# Patient Record
Sex: Female | Born: 1935 | Race: White | Hispanic: No | Marital: Married | Smoking: Never smoker
Health system: Southern US, Community
[De-identification: ages and names within clinical notes are randomized; demographics above are authoritative.]

## PROBLEM LIST (undated history)

## (undated) DIAGNOSIS — I5032 Chronic diastolic (congestive) heart failure: Secondary | ICD-10-CM

## (undated) DIAGNOSIS — E871 Hypo-osmolality and hyponatremia: Secondary | ICD-10-CM

## (undated) DIAGNOSIS — I340 Nonrheumatic mitral (valve) insufficiency: Secondary | ICD-10-CM

## (undated) DIAGNOSIS — D509 Iron deficiency anemia, unspecified: Secondary | ICD-10-CM

## (undated) DIAGNOSIS — I071 Rheumatic tricuspid insufficiency: Secondary | ICD-10-CM

## (undated) DIAGNOSIS — I10 Essential (primary) hypertension: Secondary | ICD-10-CM

## (undated) DIAGNOSIS — I4891 Unspecified atrial fibrillation: Secondary | ICD-10-CM

## (undated) DIAGNOSIS — G459 Transient cerebral ischemic attack, unspecified: Secondary | ICD-10-CM

## (undated) DIAGNOSIS — H409 Unspecified glaucoma: Secondary | ICD-10-CM

## (undated) HISTORY — PX: CATARACT EXTRACTION: SUR2

## (undated) HISTORY — PX: STRABISMUS SURGERY: SHX218

## (undated) HISTORY — PX: TONSILLECTOMY: SUR1361

## (undated) HISTORY — PX: DIAGNOSTIC LAPAROSCOPY: SUR761

## (undated) HISTORY — DX: Unspecified atrial fibrillation: I48.91

## (undated) HISTORY — DX: Unspecified glaucoma: H40.9

## (undated) HISTORY — DX: Chronic diastolic (congestive) heart failure: I50.32

## (undated) HISTORY — PX: ABDOMINAL HYSTERECTOMY: SHX81

## (undated) HISTORY — DX: Hypo-osmolality and hyponatremia: E87.1

## (undated) HISTORY — DX: Transient cerebral ischemic attack, unspecified: G45.9

## (undated) HISTORY — DX: Rheumatic tricuspid insufficiency: I07.1

## (undated) HISTORY — DX: Iron deficiency anemia, unspecified: D50.9

## (undated) HISTORY — DX: Nonrheumatic mitral (valve) insufficiency: I34.0

---

## 1995-07-24 DIAGNOSIS — G459 Transient cerebral ischemic attack, unspecified: Secondary | ICD-10-CM

## 1995-07-24 HISTORY — DX: Transient cerebral ischemic attack, unspecified: G45.9

## 2001-02-18 ENCOUNTER — Other Ambulatory Visit: Admission: RE | Admit: 2001-02-18 | Discharge: 2001-02-18 | Payer: Self-pay | Admitting: Obstetrics and Gynecology

## 2001-02-24 ENCOUNTER — Encounter
Admission: RE | Admit: 2001-02-24 | Discharge: 2001-02-24 | Payer: Self-pay | Admitting: Physical Medicine & Rehabilitation

## 2001-02-24 ENCOUNTER — Encounter: Payer: Self-pay | Admitting: Obstetrics and Gynecology

## 2001-08-26 ENCOUNTER — Encounter: Payer: Self-pay | Admitting: Obstetrics and Gynecology

## 2001-08-26 ENCOUNTER — Encounter: Admission: RE | Admit: 2001-08-26 | Discharge: 2001-08-26 | Payer: Self-pay | Admitting: Obstetrics and Gynecology

## 2002-12-03 ENCOUNTER — Encounter: Admission: RE | Admit: 2002-12-03 | Discharge: 2002-12-03 | Payer: Self-pay | Admitting: Obstetrics and Gynecology

## 2002-12-03 ENCOUNTER — Encounter: Payer: Self-pay | Admitting: Obstetrics and Gynecology

## 2002-12-15 ENCOUNTER — Other Ambulatory Visit: Admission: RE | Admit: 2002-12-15 | Discharge: 2002-12-15 | Payer: Self-pay | Admitting: Obstetrics and Gynecology

## 2002-12-29 ENCOUNTER — Encounter: Payer: Self-pay | Admitting: Internal Medicine

## 2002-12-29 ENCOUNTER — Encounter: Admission: RE | Admit: 2002-12-29 | Discharge: 2002-12-29 | Payer: Self-pay | Admitting: Internal Medicine

## 2003-03-15 ENCOUNTER — Encounter: Admission: RE | Admit: 2003-03-15 | Discharge: 2003-03-15 | Payer: Self-pay | Admitting: Obstetrics and Gynecology

## 2003-03-15 ENCOUNTER — Encounter: Payer: Self-pay | Admitting: Obstetrics and Gynecology

## 2003-08-04 ENCOUNTER — Ambulatory Visit (HOSPITAL_COMMUNITY): Admission: RE | Admit: 2003-08-04 | Discharge: 2003-08-04 | Payer: Self-pay | Admitting: *Deleted

## 2003-08-04 ENCOUNTER — Encounter (INDEPENDENT_AMBULATORY_CARE_PROVIDER_SITE_OTHER): Payer: Self-pay | Admitting: *Deleted

## 2003-12-17 ENCOUNTER — Encounter: Admission: RE | Admit: 2003-12-17 | Discharge: 2003-12-17 | Payer: Self-pay | Admitting: Obstetrics and Gynecology

## 2004-02-15 ENCOUNTER — Encounter: Admission: RE | Admit: 2004-02-15 | Discharge: 2004-05-15 | Payer: Self-pay | Admitting: Neurology

## 2004-03-07 ENCOUNTER — Encounter: Admission: RE | Admit: 2004-03-07 | Discharge: 2004-03-07 | Payer: Self-pay | Admitting: Ophthalmology

## 2004-03-10 ENCOUNTER — Ambulatory Visit (HOSPITAL_COMMUNITY): Admission: RE | Admit: 2004-03-10 | Discharge: 2004-03-10 | Payer: Self-pay | Admitting: Ophthalmology

## 2004-03-10 ENCOUNTER — Ambulatory Visit (HOSPITAL_BASED_OUTPATIENT_CLINIC_OR_DEPARTMENT_OTHER): Admission: RE | Admit: 2004-03-10 | Discharge: 2004-03-10 | Payer: Self-pay | Admitting: Ophthalmology

## 2005-01-02 ENCOUNTER — Other Ambulatory Visit: Admission: RE | Admit: 2005-01-02 | Discharge: 2005-01-02 | Payer: Self-pay | Admitting: Obstetrics and Gynecology

## 2005-01-09 ENCOUNTER — Encounter: Admission: RE | Admit: 2005-01-09 | Discharge: 2005-01-09 | Payer: Self-pay | Admitting: Internal Medicine

## 2005-04-05 ENCOUNTER — Ambulatory Visit (HOSPITAL_COMMUNITY): Admission: RE | Admit: 2005-04-05 | Discharge: 2005-04-05 | Payer: Self-pay | Admitting: Obstetrics and Gynecology

## 2006-02-18 ENCOUNTER — Encounter: Admission: RE | Admit: 2006-02-18 | Discharge: 2006-02-18 | Payer: Self-pay | Admitting: Obstetrics and Gynecology

## 2006-12-10 ENCOUNTER — Encounter: Admission: RE | Admit: 2006-12-10 | Discharge: 2006-12-10 | Payer: Self-pay | Admitting: Internal Medicine

## 2006-12-24 ENCOUNTER — Ambulatory Visit (HOSPITAL_COMMUNITY): Admission: RE | Admit: 2006-12-24 | Discharge: 2006-12-24 | Payer: Self-pay | Admitting: Internal Medicine

## 2006-12-24 ENCOUNTER — Encounter (INDEPENDENT_AMBULATORY_CARE_PROVIDER_SITE_OTHER): Payer: Self-pay | Admitting: Internal Medicine

## 2007-03-05 ENCOUNTER — Encounter: Admission: RE | Admit: 2007-03-05 | Discharge: 2007-03-05 | Payer: Self-pay | Admitting: Obstetrics and Gynecology

## 2007-03-18 ENCOUNTER — Other Ambulatory Visit: Admission: RE | Admit: 2007-03-18 | Discharge: 2007-03-18 | Payer: Self-pay | Admitting: Obstetrics and Gynecology

## 2008-03-08 ENCOUNTER — Encounter: Admission: RE | Admit: 2008-03-08 | Discharge: 2008-03-08 | Payer: Self-pay | Admitting: Internal Medicine

## 2008-10-19 ENCOUNTER — Encounter: Admission: RE | Admit: 2008-10-19 | Discharge: 2008-10-19 | Payer: Self-pay | Admitting: Internal Medicine

## 2009-03-09 ENCOUNTER — Encounter: Admission: RE | Admit: 2009-03-09 | Discharge: 2009-03-09 | Payer: Self-pay | Admitting: Internal Medicine

## 2009-03-16 ENCOUNTER — Encounter: Admission: RE | Admit: 2009-03-16 | Discharge: 2009-03-16 | Payer: Self-pay | Admitting: Internal Medicine

## 2009-12-05 ENCOUNTER — Encounter: Admission: RE | Admit: 2009-12-05 | Discharge: 2009-12-05 | Payer: Self-pay | Admitting: Internal Medicine

## 2010-05-09 ENCOUNTER — Encounter: Admission: RE | Admit: 2010-05-09 | Discharge: 2010-05-09 | Payer: Self-pay | Admitting: Internal Medicine

## 2010-08-14 ENCOUNTER — Encounter: Payer: Self-pay | Admitting: Internal Medicine

## 2010-12-08 NOTE — Op Note (Signed)
NAME:  [name] Jones, [name] Jones                       ACCOUNT NO.:  [id number]   MEDICAL RECORD NO.:  [id number]                   PATIENT TYPE:  OUT   LOCATION:  [facility]                                 FACILITY:  [facility]   PHYSICIAN:  Pasty Spillers. Maple Hudson, M.D.              DATE OF BIRTH:  Feb 26, 1924   DATE OF PROCEDURE:  DATE OF DISCHARGE:  03/10/2004                                 OPERATIVE REPORT   DATE OF PROCEDURE:  March 10, 2004.   PREOPERATIVE DIAGNOSES:  1.  Esotropia.  2.  Right excyclotropia.   POSTOPERATIVE DIAGNOSES:  1.  Esotropia.  2.  Right excyclotropia.   PROCEDURE:  1.  Right medial rectus muscle resection, 3.5 mm, 1.5 tendon width upshift.  2.  Right lateral rectus muscle resection, 5.0 mm, with 0.5 tendon width      downshift.   SURGEON:  Pasty Spillers. Young, MD.   ANESTHESIA:  General (laryngeal mask).   COMPLICATIONS:  None.   DESCRIPTION OF PROCEDURE:  After routine preop evaluation including informed  consent, the patient was taken to the operating room where she was  identified by me.  General anesthesia was induced without difficulty after  placement of appropriate monitors.  The patient was prepped and draped in a  standard sterile fashion.  A lid speculum was placed in the right eye.  A  traction suture of 6-0 silk was placed at the superior and inferior limbus,  and this was brought out of the eye temporally.   A limbal conjunctival peritomy at the 2 o'clock hour extent was made,  entered the right eye with Westcott scissors, relaxing incisions in the  supranasal and infranasal quadrants.  The right medial rectus muscle was  engaged on a series of muscle hooks and carefully cleared of its fascial  attachment.  The tendon was secured with double armed 6-0 Vicryl suture with  a double locking knot at each border of the muscle, 1 mm from the insertion.  The muscle was disinserted.  It was reattached to sclera at 3.5 mm posterior  to the original insertion,  with each of its poles upshifted 0.5 tendon width  from its original location.  The suture ends were tied securely after the  position of the muscle had been checked and found to be accurate.  Conjunctiva was closed with multiple interrupted 6-0 plain gut sutures,  leaving conjunctiva recessed approximately 2 mm.  The traction sutures were  used to draw in the eye nasally.  A limbal conjunctival peritomy was made  temporally with Westcott scissors as described for the medial rectus muscle.  The right lateral rectus muscle was engaged on a series of muscle hooks and  carefully cleared of its fascial attachments.  The muscle was spread between  two self-retaining hooks.  A 2-mm bite was taken off the center of the  muscle belly at a measured distance of 5.0 mm posterior to the original  insertion, the knot was tied securely at this location.  The needle at each  end of the double-armed 6-0 Vicryl suture was passed from the center of the  muscle belly to the periphery, parallel and 5.0 mm posterior to the original  insertion.  A double locking bite was placed in each border of the muscle.  A resection clamp was placed on the muscle just anterior to the sutures.  The muscle was disinserted.  The superior pole suture was passed posteriorly  and anteriorly through the inferior end of the original stump, then  anteriorly to posteriorly 3 mm below this, then posteriorly and anteriorly  through the center of the muscle belly, just posterior to the previously  placed knot.  The inferior pole suture was passed in a similar fashion,  beginning approximately 8 mm below the location of the superior pole.  All  slack in the pole sutures was removed as the muscle was pulled up to the  level of the original insertion, now down 0.5 tendon width.  The suture ends  were tied securely.  The resection clamp was removed, the portion of the  muscle anterior to the sutures was carefully excised.  Conjunctiva was   closed with multiple interrupted 6-0 plain gut sutures.  TobraDex ointment  was placed in the eye after each traction suture had been removed.  The  patient was awakened without difficulty and taken to the recovery room in  stable condition, having suffered no intraoperative or postoperative  complications.                                               Pasty Spillers. Maple Hudson, M.D.    Cheron Schaumann  D:  04/04/2004  T:  04/04/2004  Job:  [id number]

## 2011-05-14 ENCOUNTER — Other Ambulatory Visit: Payer: Self-pay | Admitting: Internal Medicine

## 2011-05-14 DIAGNOSIS — Z1231 Encounter for screening mammogram for malignant neoplasm of breast: Secondary | ICD-10-CM

## 2011-06-04 ENCOUNTER — Ambulatory Visit
Admission: RE | Admit: 2011-06-04 | Discharge: 2011-06-04 | Disposition: A | Discharge status: Home / Self Care | Payer: Medicare Other | Source: Ambulatory Visit | Attending: Internal Medicine | Admitting: Internal Medicine

## 2011-06-04 DIAGNOSIS — Z1231 Encounter for screening mammogram for malignant neoplasm of breast: Secondary | ICD-10-CM

## 2012-05-13 ENCOUNTER — Other Ambulatory Visit: Payer: Self-pay | Admitting: Internal Medicine

## 2012-05-13 DIAGNOSIS — Z1231 Encounter for screening mammogram for malignant neoplasm of breast: Secondary | ICD-10-CM

## 2012-06-16 ENCOUNTER — Ambulatory Visit
Admission: RE | Admit: 2012-06-16 | Discharge: 2012-06-16 | Disposition: A | Discharge status: Home / Self Care | Payer: Medicare Other | Source: Ambulatory Visit | Attending: Internal Medicine | Admitting: Internal Medicine

## 2012-06-16 DIAGNOSIS — Z1231 Encounter for screening mammogram for malignant neoplasm of breast: Secondary | ICD-10-CM

## 2013-07-27 ENCOUNTER — Other Ambulatory Visit: Payer: Self-pay

## 2013-07-27 DIAGNOSIS — Z1231 Encounter for screening mammogram for malignant neoplasm of breast: Secondary | ICD-10-CM

## 2013-08-17 ENCOUNTER — Ambulatory Visit
Admission: RE | Admit: 2013-08-17 | Discharge: 2013-08-17 | Disposition: A | Discharge status: Home / Self Care | Payer: Medicare Other | Source: Ambulatory Visit

## 2013-08-17 DIAGNOSIS — Z1231 Encounter for screening mammogram for malignant neoplasm of breast: Secondary | ICD-10-CM

## 2014-05-19 ENCOUNTER — Other Ambulatory Visit: Payer: Self-pay | Admitting: Internal Medicine

## 2014-05-19 DIAGNOSIS — E2839 Other primary ovarian failure: Secondary | ICD-10-CM

## 2014-05-21 ENCOUNTER — Ambulatory Visit
Admission: RE | Admit: 2014-05-21 | Discharge: 2014-05-21 | Disposition: A | Discharge status: Home / Self Care | Payer: Medicare Other | Source: Ambulatory Visit | Attending: Internal Medicine | Admitting: Internal Medicine

## 2014-05-21 ENCOUNTER — Other Ambulatory Visit: Payer: Self-pay | Admitting: Internal Medicine

## 2014-05-21 DIAGNOSIS — R05 Cough: Secondary | ICD-10-CM

## 2014-05-21 DIAGNOSIS — R059 Cough, unspecified: Secondary | ICD-10-CM

## 2014-05-31 ENCOUNTER — Encounter (INDEPENDENT_AMBULATORY_CARE_PROVIDER_SITE_OTHER): Payer: Self-pay

## 2014-05-31 ENCOUNTER — Ambulatory Visit
Admission: RE | Admit: 2014-05-31 | Discharge: 2014-05-31 | Disposition: A | Discharge status: Home / Self Care | Payer: Medicare Other | Source: Ambulatory Visit | Attending: Internal Medicine | Admitting: Internal Medicine

## 2014-05-31 DIAGNOSIS — E2839 Other primary ovarian failure: Secondary | ICD-10-CM

## 2014-08-26 ENCOUNTER — Other Ambulatory Visit: Payer: Self-pay

## 2014-08-26 DIAGNOSIS — Z1231 Encounter for screening mammogram for malignant neoplasm of breast: Secondary | ICD-10-CM

## 2014-09-01 ENCOUNTER — Other Ambulatory Visit: Payer: Self-pay

## 2014-09-01 ENCOUNTER — Ambulatory Visit
Admission: RE | Admit: 2014-09-01 | Discharge: 2014-09-01 | Disposition: A | Discharge status: Home / Self Care | Payer: Medicare Other | Source: Ambulatory Visit

## 2014-09-01 DIAGNOSIS — Z1231 Encounter for screening mammogram for malignant neoplasm of breast: Secondary | ICD-10-CM

## 2014-12-01 ENCOUNTER — Other Ambulatory Visit (HOSPITAL_COMMUNITY): Payer: Self-pay | Admitting: Respiratory Therapy

## 2014-12-01 DIAGNOSIS — R0602 Shortness of breath: Secondary | ICD-10-CM

## 2014-12-07 ENCOUNTER — Ambulatory Visit (HOSPITAL_COMMUNITY)
Admission: RE | Admit: 2014-12-07 | Discharge: 2014-12-07 | Disposition: A | Discharge status: Home / Self Care | Payer: Medicare Other | Source: Ambulatory Visit | Attending: Internal Medicine | Admitting: Internal Medicine

## 2014-12-07 DIAGNOSIS — R0602 Shortness of breath: Secondary | ICD-10-CM | POA: Insufficient documentation

## 2014-12-07 MED ORDER — ALBUTEROL SULFATE (2.5 MG/3ML) 0.083% IN NEBU
2.5000 mg | INHALATION_SOLUTION | Freq: Once | RESPIRATORY_TRACT | Status: AC
  Administered 2014-12-07: 2.5 mg via RESPIRATORY_TRACT

## 2014-12-09 LAB — PULMONARY FUNCTION TEST
DL/VA % pred: 93 %
DL/VA: 3.98 ml/min/mmHg/L
DLCO unc % pred: 81 %
DLCO unc: 15.37 ml/min/mmHg
FEF 25-75 Post: 1.66 L/sec
FEF 25-75 Pre: 1.38 L/sec
FEF2575-%Change-Post: 20 %
FEF2575-%Pred-Post: 237 %
FEF2575-%Pred-Pre: 196 %
FEV1-%Change-Post: 5 %
FEV1-%Pred-Post: 137 %
FEV1-%Pred-Pre: 129 %
FEV1-Post: 1.7 L
FEV1-Pre: 1.61 L
FEV1FVC-%Change-Post: -1 %
FEV1FVC-%Pred-Pre: 107 %
FEV6-%Change-Post: 8 %
FEV6-%Pred-Post: 141 %
FEV6-%Pred-Pre: 131 %
FEV6-Post: 2.24 L
FEV6-Pre: 2.07 L
FEV6FVC-%Change-Post: 0 %
FEV6FVC-%Pred-Post: 108 %
FEV6FVC-%Pred-Pre: 107 %
FVC-%Change-Post: 7 %
FVC-%Pred-Post: 130 %
FVC-%Pred-Pre: 121 %
FVC-Post: 2.24 L
FVC-Pre: 2.09 L
Post FEV1/FVC ratio: 76 %
Post FEV6/FVC ratio: 100 %
Pre FEV1/FVC ratio: 77 %
Pre FEV6/FVC Ratio: 99 %
RV % pred: 107 %
RV: 2.54 L
TLC % pred: 105 %
TLC: 4.72 L

## 2014-12-15 ENCOUNTER — Other Ambulatory Visit (HOSPITAL_COMMUNITY): Payer: Self-pay | Admitting: Internal Medicine

## 2014-12-15 DIAGNOSIS — R0600 Dyspnea, unspecified: Secondary | ICD-10-CM

## 2014-12-17 ENCOUNTER — Ambulatory Visit (HOSPITAL_COMMUNITY)
Admission: RE | Admit: 2014-12-17 | Discharge: 2014-12-17 | Disposition: A | Discharge status: Home / Self Care | Payer: Medicare Other | Source: Ambulatory Visit | Attending: Internal Medicine | Admitting: Internal Medicine

## 2014-12-17 DIAGNOSIS — R0609 Other forms of dyspnea: Secondary | ICD-10-CM | POA: Insufficient documentation

## 2014-12-17 DIAGNOSIS — I10 Essential (primary) hypertension: Secondary | ICD-10-CM | POA: Diagnosis not present

## 2014-12-17 DIAGNOSIS — R0600 Dyspnea, unspecified: Secondary | ICD-10-CM

## 2014-12-17 DIAGNOSIS — E785 Hyperlipidemia, unspecified: Secondary | ICD-10-CM | POA: Insufficient documentation

## 2014-12-17 NOTE — Progress Notes (Signed)
  Echocardiogram 2D Echocardiogram has been performed.  [name] Jones, [name] Jones R 12/17/2014, 10:57 AM

## 2015-06-01 ENCOUNTER — Emergency Department (HOSPITAL_COMMUNITY): Payer: Medicare Other

## 2015-06-01 ENCOUNTER — Encounter (HOSPITAL_COMMUNITY): Payer: Self-pay | Admitting: Anesthesiology

## 2015-06-01 ENCOUNTER — Inpatient Hospital Stay (HOSPITAL_COMMUNITY)
Admission: EM | Admit: 2015-06-01 | Discharge: 2015-06-10 | DRG: 291 | Disposition: A | Discharge status: Home Health | Payer: Medicare Other | Attending: Cardiovascular Disease | Admitting: Cardiovascular Disease

## 2015-06-01 ENCOUNTER — Inpatient Hospital Stay (HOSPITAL_COMMUNITY): Payer: Medicare Other

## 2015-06-01 DIAGNOSIS — I5021 Acute systolic (congestive) heart failure: Secondary | ICD-10-CM | POA: Diagnosis present

## 2015-06-01 DIAGNOSIS — H409 Unspecified glaucoma: Secondary | ICD-10-CM | POA: Diagnosis present

## 2015-06-01 DIAGNOSIS — J810 Acute pulmonary edema: Secondary | ICD-10-CM

## 2015-06-01 DIAGNOSIS — E876 Hypokalemia: Secondary | ICD-10-CM | POA: Diagnosis not present

## 2015-06-01 DIAGNOSIS — J209 Acute bronchitis, unspecified: Secondary | ICD-10-CM | POA: Diagnosis present

## 2015-06-01 DIAGNOSIS — R000 Tachycardia, unspecified: Secondary | ICD-10-CM | POA: Diagnosis not present

## 2015-06-01 DIAGNOSIS — Z7901 Long term (current) use of anticoagulants: Secondary | ICD-10-CM | POA: Diagnosis not present

## 2015-06-01 DIAGNOSIS — R079 Chest pain, unspecified: Secondary | ICD-10-CM

## 2015-06-01 DIAGNOSIS — R0789 Other chest pain: Secondary | ICD-10-CM

## 2015-06-01 DIAGNOSIS — J189 Pneumonia, unspecified organism: Secondary | ICD-10-CM | POA: Diagnosis present

## 2015-06-01 DIAGNOSIS — E871 Hypo-osmolality and hyponatremia: Secondary | ICD-10-CM | POA: Diagnosis present

## 2015-06-01 DIAGNOSIS — I110 Hypertensive heart disease with heart failure: Secondary | ICD-10-CM | POA: Diagnosis present

## 2015-06-01 DIAGNOSIS — I482 Chronic atrial fibrillation: Secondary | ICD-10-CM | POA: Diagnosis present

## 2015-06-01 DIAGNOSIS — I4891 Unspecified atrial fibrillation: Secondary | ICD-10-CM

## 2015-06-01 DIAGNOSIS — K5900 Constipation, unspecified: Secondary | ICD-10-CM | POA: Diagnosis not present

## 2015-06-01 DIAGNOSIS — I081 Rheumatic disorders of both mitral and tricuspid valves: Secondary | ICD-10-CM | POA: Diagnosis present

## 2015-06-01 DIAGNOSIS — D509 Iron deficiency anemia, unspecified: Secondary | ICD-10-CM | POA: Diagnosis present

## 2015-06-01 DIAGNOSIS — R0602 Shortness of breath: Secondary | ICD-10-CM | POA: Diagnosis present

## 2015-06-01 DIAGNOSIS — I5023 Acute on chronic systolic (congestive) heart failure: Secondary | ICD-10-CM | POA: Diagnosis present

## 2015-06-01 DIAGNOSIS — I509 Heart failure, unspecified: Secondary | ICD-10-CM

## 2015-06-01 DIAGNOSIS — R0600 Dyspnea, unspecified: Secondary | ICD-10-CM

## 2015-06-01 HISTORY — DX: Essential (primary) hypertension: I10

## 2015-06-01 LAB — BASIC METABOLIC PANEL
Anion gap: 11 (ref 5–15)
BUN: 14 mg/dL (ref 6–20)
CO2: 23 mmol/L (ref 22–32)
Calcium: 9 mg/dL (ref 8.9–10.3)
Chloride: 99 mmol/L — ABNORMAL LOW (ref 101–111)
Creatinine, Ser: 0.63 mg/dL (ref 0.44–1.00)
GFR calc Af Amer: >60 mL/min (ref >60)
GFR calc non Af Amer: >60 mL/min (ref >60)
Glucose, Bld: 138 mg/dL — ABNORMAL HIGH (ref 65–99)
Potassium: 4.4 mmol/L (ref 3.5–5.1)
Sodium: 133 mmol/L — ABNORMAL LOW (ref 135–145)

## 2015-06-01 LAB — CBC WITH DIFFERENTIAL/PLATELET
Basophils Absolute: 0 10*3/uL (ref 0.0–0.1)
Basophils Relative: 0 %
Eosinophils Absolute: 0.1 10*3/uL (ref 0.0–0.7)
Eosinophils Relative: 1 %
HCT: 31.1 % — ABNORMAL LOW (ref 36.0–46.0)
Hemoglobin: 10.5 g/dL — ABNORMAL LOW (ref 12.0–15.0)
Lymphocytes Relative: 7 %
Lymphs Abs: 0.7 10*3/uL (ref 0.7–4.0)
MCH: 29.7 pg (ref 26.0–34.0)
MCHC: 33.8 g/dL (ref 30.0–36.0)
MCV: 87.9 fL (ref 78.0–100.0)
Monocytes Absolute: 0.4 10*3/uL (ref 0.1–1.0)
Monocytes Relative: 4 %
Neutro Abs: 8.6 10*3/uL — ABNORMAL HIGH (ref 1.7–7.7)
Neutrophils Relative %: 88 %
Platelets: 279 10*3/uL (ref 150–400)
RBC: 3.54 MIL/uL — ABNORMAL LOW (ref 3.87–5.11)
RDW: 13.5 % (ref 11.5–15.5)
WBC: 9.8 10*3/uL (ref 4.0–10.5)

## 2015-06-01 LAB — BRAIN NATRIURETIC PEPTIDE: B Natriuretic Peptide: 286.8 pg/mL — ABNORMAL HIGH (ref 0.0–100.0)

## 2015-06-01 LAB — MRSA PCR SCREENING: MRSA by PCR: NEGATIVE

## 2015-06-01 LAB — I-STAT TROPONIN, ED: Troponin i, poc: 0 ng/mL (ref 0.00–0.08)

## 2015-06-01 LAB — TROPONIN I
Troponin I: <0.03 ng/mL (ref <0.031)
Troponin I: <0.03 ng/mL (ref <0.031)

## 2015-06-01 MED ORDER — DILTIAZEM HCL 25 MG/5ML IV SOLN
5.0000 mg | Freq: Once | INTRAVENOUS | Status: AC
  Administered 2015-06-01: 5 mg via INTRAVENOUS
  Filled 2015-06-01: qty 5

## 2015-06-01 MED ORDER — TIMOLOL MALEATE 0.5 % OP SOLN
1.0000 [drp] | Freq: Every day | OPHTHALMIC | Status: DC
  Administered 2015-06-01 – 2015-06-10 (×10): 1 [drp] via OPHTHALMIC
  Filled 2015-06-01 (×2): qty 5

## 2015-06-01 MED ORDER — ONDANSETRON HCL 4 MG/2ML IJ SOLN
4.0000 mg | Freq: Four times a day (QID) | INTRAMUSCULAR | Status: DC | PRN
  Administered 2015-06-02 – 2015-06-04 (×2): 4 mg via INTRAVENOUS
  Filled 2015-06-01 (×2): qty 2

## 2015-06-01 MED ORDER — METOPROLOL SUCCINATE ER 50 MG PO TB24
50.0000 mg | ORAL_TABLET | Freq: Every day | ORAL | Status: DC
  Administered 2015-06-01 – 2015-06-10 (×10): 50 mg via ORAL
  Filled 2015-06-01 (×10): qty 1

## 2015-06-01 MED ORDER — SODIUM CHLORIDE 0.9 % IV SOLN: 250.0000 mL | INTRAVENOUS | Status: DC | PRN

## 2015-06-01 MED ORDER — CETYLPYRIDINIUM CHLORIDE 0.05 % MT LIQD
7.0000 mL | Freq: Two times a day (BID) | OROMUCOSAL | Status: DC
  Administered 2015-06-01 – 2015-06-09 (×16): 7 mL via OROMUCOSAL

## 2015-06-01 MED ORDER — LATANOPROST 0.005 % OP SOLN
1.0000 [drp] | Freq: Every day | OPHTHALMIC | Status: DC
  Administered 2015-06-01 – 2015-06-09 (×9): 1 [drp] via OPHTHALMIC
  Filled 2015-06-01 (×3): qty 2.5

## 2015-06-01 MED ORDER — FUROSEMIDE 10 MG/ML IJ SOLN
40.0000 mg | Freq: Once | INTRAMUSCULAR | Status: AC
  Administered 2015-06-01: 40 mg via INTRAVENOUS
  Filled 2015-06-01: qty 4

## 2015-06-01 MED ORDER — OFF THE BEAT BOOK
Freq: Once | Status: DC
  Filled 2015-06-01: qty 1

## 2015-06-01 MED ORDER — VITAMIN C 500 MG PO TABS
500.0000 mg | ORAL_TABLET | Freq: Every day | ORAL | Status: DC
  Administered 2015-06-02 – 2015-06-10 (×9): 500 mg via ORAL
  Filled 2015-06-01 (×10): qty 1

## 2015-06-01 MED ORDER — AMLODIPINE BESYLATE 5 MG PO TABS
5.0000 mg | ORAL_TABLET | Freq: Every day | ORAL | Status: DC
  Administered 2015-06-01 – 2015-06-02 (×2): 5 mg via ORAL
  Filled 2015-06-01 (×2): qty 1

## 2015-06-01 MED ORDER — ACETAMINOPHEN 325 MG PO TABS
650.0000 mg | ORAL_TABLET | ORAL | Status: DC | PRN
  Administered 2015-06-01 – 2015-06-10 (×4): 650 mg via ORAL
  Filled 2015-06-01 (×4): qty 2

## 2015-06-01 MED ORDER — DEXTROSE 5 % IV SOLN
5.0000 mg/h | Freq: Once | INTRAVENOUS | Status: AC
  Administered 2015-06-01: 5 mg/h via INTRAVENOUS
  Filled 2015-06-01: qty 100

## 2015-06-01 MED ORDER — DILTIAZEM HCL 100 MG IV SOLR
5.0000 mg/h | INTRAVENOUS | Status: DC
  Administered 2015-06-02 (×2): 15 mg/h via INTRAVENOUS
  Filled 2015-06-01 (×3): qty 100

## 2015-06-01 MED ORDER — SODIUM CHLORIDE 0.9 % IJ SOLN
3.0000 mL | INTRAMUSCULAR | Status: DC | PRN
  Administered 2015-06-05: 3 mL via INTRAVENOUS
  Filled 2015-06-01: qty 3

## 2015-06-01 MED ORDER — ADULT MULTIVITAMIN W/MINERALS CH
1.0000 | ORAL_TABLET | Freq: Every day | ORAL | Status: DC
  Administered 2015-06-02 – 2015-06-10 (×9): 1 via ORAL
  Filled 2015-06-01 (×10): qty 1

## 2015-06-01 MED ORDER — SODIUM CHLORIDE 0.9 % IJ SOLN
3.0000 mL | Freq: Two times a day (BID) | INTRAMUSCULAR | Status: DC
  Administered 2015-06-01 – 2015-06-10 (×14): 3 mL via INTRAVENOUS

## 2015-06-01 MED ORDER — RIVAROXABAN 15 MG PO TABS
15.0000 mg | ORAL_TABLET | Freq: Every day | ORAL | Status: DC
  Administered 2015-06-02 – 2015-06-10 (×9): 15 mg via ORAL
  Filled 2015-06-01 (×10): qty 1

## 2015-06-01 MED ORDER — RIVAROXABAN 20 MG PO TABS
20.0000 mg | ORAL_TABLET | Freq: Every day | ORAL | Status: DC
  Administered 2015-06-01: 20 mg via ORAL
  Filled 2015-06-01: qty 1

## 2015-06-01 MED ORDER — FUROSEMIDE 10 MG/ML IJ SOLN
40.0000 mg | Freq: Two times a day (BID) | INTRAMUSCULAR | Status: DC
  Administered 2015-06-02: 40 mg via INTRAVENOUS
  Filled 2015-06-01: qty 4

## 2015-06-01 NOTE — ED Notes (Signed)
Ultrasound at bedside

## 2015-06-01 NOTE — ED Provider Notes (Signed)
CSN: [id number]     Arrival date & time 06/01/15  1133 History   First MD Initiated Contact with Patient 06/01/15 1151     Chief Complaint  Patient presents with  . Shortness of Breath     (Consider location/radiation/quality/duration/timing/severity/associated sxs/prior Treatment) Patient is a 81 y.o. female presenting with shortness of breath. The history is provided by the patient and medical records.  Shortness of Breath   This is an 81 year old female with recently diagnosed A. fib by her PCP on Monday, presenting to the ED for shortness of breath.  Patient states over the weekend she felt very "winded" with any type of exertional activity. She states she was seen by her family care doctor on Monday and told she had A. fib. She was started on xarelto and metoprolol and was supposed to follow-up in 2 weeks.  Patient states she has been taking the medications as directed for the past 2 days but states symptoms are worsening. She states this morning she simply could not catch her breath, even while sitting.  She states when she gets up she feels increased shortness of breath, lightheaded, and somewhat dizzy. She states she does have palpitations but denies any chest pain. Patient has no known cardiac history aside from A. fib. She denies any recent cough, fever, or chills. Patient remains in A. fib on arrival, rate 110's -120's.  No past medical history on file. No past surgical history on file. No family history on file. Social History  Substance Use Topics  . Smoking status: Not on file  . Smokeless tobacco: Not on file  . Alcohol Use: Not on file   OB History    No data available     Review of Systems  Respiratory: Positive for shortness of breath.   Cardiovascular: Positive for palpitations.  All other systems reviewed and are negative.     Allergies  Review of patient's allergies indicates not on file.  Home Medications   Prior to Admission medications   Not on File    BP 169/104 mmHg  Pulse 118  Temp(Src) 98.1 F (36.7 C) (Oral)  Resp 22  Ht 5' (1.524 m)  Wt 125 lb (56.7 kg)  BMI 24.41 kg/m2  SpO2 100%   Physical Exam  Constitutional: She is oriented to person, place, and time. She appears well-developed and well-nourished.  HENT:  Head: Normocephalic and atraumatic.  Mouth/Throat: Oropharynx is clear and moist.  Eyes: Conjunctivae and EOM are normal. Pupils are equal, round, and reactive to light.  Neck: Normal range of motion.  Cardiovascular: Normal heart sounds.  An irregularly irregular rhythm present. Tachycardia present.   AFIB w/ RVR  Pulmonary/Chest: Effort normal and breath sounds normal. No respiratory distress. She has no wheezes.  Speaking in short truncated sentences, no distress  Abdominal: Soft. Bowel sounds are normal.  Musculoskeletal: Normal range of motion.  Neurological: She is alert and oriented to person, place, and time.  Skin: Skin is warm and dry.  Psychiatric: She has a normal mood and affect.  Nursing note and vitals reviewed.   ED Course  Procedures (including critical care time)  CRITICAL CARE Performed by: Garlon Hatchet   Total critical care time: 45 minutes  Critical care time was exclusive of separately billable procedures and treating other patients.  Critical care was necessary to treat or prevent imminent or life-threatening deterioration.  Critical care was time spent personally by me on the following activities: development of treatment plan with patient and/or  surrogate as well as nursing, discussions with consultants, evaluation of patient's response to treatment, examination of patient, obtaining history from patient or surrogate, ordering and performing treatments and interventions, ordering and review of laboratory studies, ordering and review of radiographic studies, pulse oximetry and re-evaluation of patient's condition.  Medications  furosemide (LASIX) injection 40 mg (not  administered)  diltiazem (CARDIZEM) injection 5 mg (5 mg Intravenous Given 06/01/15 1236)    Followed by  diltiazem (CARDIZEM) 100 mg in dextrose 5 % 100 mL (1 mg/mL) infusion (10 mg/hr Intravenous Rate/Dose Change 06/01/15 1344)   Labs Review Labs Reviewed  CBC WITH DIFFERENTIAL/PLATELET - Abnormal; Notable for the following:    RBC 3.54 (*)    Hemoglobin 10.5 (*)    HCT 31.1 (*)    Neutro Abs 8.6 (*)    All other components within normal limits  BASIC METABOLIC PANEL - Abnormal; Notable for the following:    Sodium 133 (*)    Chloride 99 (*)    Glucose, Bld 138 (*)    All other components within normal limits  BRAIN NATRIURETIC PEPTIDE - Abnormal; Notable for the following:    B Natriuretic Peptide 286.8 (*)    All other components within normal limits  I-STAT TROPOININ, ED    Imaging Review Dg Chest 2 View  06/01/2015  CLINICAL DATA:  Patient C/O dyspnea that worsened this AM. She was going to PCP but called EMS instead due to dyspnea. Patient recently diagnosed with A-fib by her PCP. EXAM: CHEST  2 VIEW COMPARISON:  05/21/2014 FINDINGS: Midline trachea. Mild cardiomegaly with aortic atherosclerosis. Small, left greater than right pleural effusions. No pneumothorax. Mild to moderate interstitial prominence and indistinctness. This is asymmetric, greater on the right. Concurrent right perihilar and bibasilar airspace opacities. IMPRESSION: Cardiomegaly with moderate interstitial prominence, suspicious for pulmonary edema. Concurrent bilateral pleural effusions. Bibasilar airspace disease, greater on the left. This could represent atelectasis or concurrent infection. Aortic atherosclerosis. Electronically Signed   By: Jeronimo GreavesKyle  Talbot M.D.   On: 06/01/2015 13:01   I have personally reviewed and evaluated these images and lab results as part of my medical decision-making.   EKG Interpretation   Date/Time:  Wednesday June 01 2015 11:46:05 EST Ventricular Rate:  115 PR Interval:     QRS Duration: 87 QT Interval:  358 QTC Calculation: 495 R Axis:   75 Text Interpretation:  Atrial fibrillation Low voltage, precordial leads  Borderline prolonged QT interval Baseline wander in lead(s) I II aVR  Atrial fibrillation new from baseline no acute ischemia Confirmed by  Corlis LeakMACKUEN, COURTNEY ([id number]) on 06/01/2015 11:48:59 AM      MDM   Final diagnoses:  Atrial fibrillation with RVR (HCC)  Acute pulmonary edema (HCC)  Dyspnea   81 year old female here with dyspnea. Recently diagnosed with A. fib, was started on metoprolol and xarelto.  States symptoms worsened despite 2 days of the medications.  Patient is in A. fib with RVR on arrival, rate 120's -130's.  She has some increased work of breathing, improved with 2 L supplemental oxygen. She is able to speak in short, truncated sentences.  Labwork is overall reassuring, troponin negative. BNP is elevated at 286.8 which is new.  Chest x-ray with pulmonary edema which is also new.  Patient was started on cardizem drip, some improvement of rate to low 100's.  Low dose lasix also given for new pulmonary edema.  Case discussed with on call cardiology, Dr. Algie CofferKadakia, who has evaluated in the ED and will admit  for further management.  Garlon Hatchet, PA-C 06/01/15 1435  Courteney Randall An, MD 06/01/15 1645

## 2015-06-01 NOTE — Progress Notes (Signed)
  Echocardiogram 2D Echocardiogram has been performed.  Delcie RochENNINGTON, Alonna Bartling 06/01/2015, 3:59 PM

## 2015-06-01 NOTE — ED Notes (Signed)
Pt placed in gown and in bed. Pt monitored by pulse ox, bp cuff, and 12-lead. 

## 2015-06-01 NOTE — H&P (Signed)
Referring Physician:  TAILYNN ARMETTA is an 81 y.o. female.                       Chief Complaint: Palpitation and shortness of breath.  HPI: 81 year old female with recently diagnosed A. fib by her PCP on Monday, presenting to the ED for shortness of breath. Patient states over the weekend she felt very "winded" with any type of exertional activity. She was started on xarelto and metoprolol and was supposed to have follow-up in 2 weeks. Patient states she has been taking the medications as directed for the past 2 days but states symptoms are worsening. She states this morning she simply could not catch her breath, even while sitting. She states when she gets up she feels increased shortness of breath, lightheaded, and somewhat dizzy. She states she does have palpitations but denies any chest pain. Patient has no known cardiac history aside from A. fib. She denies any recent cough, fever, or chills. Patient remains in A. fib on arrival, rate 110's -120's. She feels better post IV lasix.  No past medical history on file.    No past surgical history on file.  No family history on file. Social History:  has no tobacco, alcohol, and drug history on file.  Allergies: No Known Allergies   (Not in a hospital admission)  Results for orders placed or performed during the hospital encounter of 06/01/15 (from the past 48 hour(s))  CBC with Differential     Status: Abnormal   Collection Time: 06/01/15 12:50 PM  Result Value Ref Range   WBC 9.8 4.0 - 10.5 K/uL   RBC 3.54 (L) 3.87 - 5.11 MIL/uL   Hemoglobin 10.5 (L) 12.0 - 15.0 g/dL   HCT 31.1 (L) 36.0 - 46.0 %   MCV 87.9 78.0 - 100.0 fL   MCH 29.7 26.0 - 34.0 pg   MCHC 33.8 30.0 - 36.0 g/dL   RDW 13.5 11.5 - 15.5 %   Platelets 279 150 - 400 K/uL   Neutrophils Relative % 88 %   Neutro Abs 8.6 (H) 1.7 - 7.7 K/uL   Lymphocytes Relative 7 %   Lymphs Abs 0.7 0.7 - 4.0 K/uL   Monocytes Relative 4 %   Monocytes Absolute 0.4 0.1 - 1.0 K/uL    Eosinophils Relative 1 %   Eosinophils Absolute 0.1 0.0 - 0.7 K/uL   Basophils Relative 0 %   Basophils Absolute 0.0 0.0 - 0.1 K/uL  Basic metabolic panel     Status: Abnormal   Collection Time: 06/01/15 12:50 PM  Result Value Ref Range   Sodium 133 (L) 135 - 145 mmol/L   Potassium 4.4 3.5 - 5.1 mmol/L   Chloride 99 (L) 101 - 111 mmol/L   CO2 23 22 - 32 mmol/L   Glucose, Bld 138 (H) 65 - 99 mg/dL   BUN 14 6 - 20 mg/dL   Creatinine, Ser 0.63 0.44 - 1.00 mg/dL   Calcium 9.0 8.9 - 10.3 mg/dL   GFR calc non Af Amer >60 >60 mL/min   GFR calc Af Amer >60 >60 mL/min    Comment: (NOTE) The eGFR has been calculated using the CKD EPI equation. This calculation has not been validated in all clinical situations. eGFR's persistently <60 mL/min signify possible Chronic Kidney Disease.    Anion gap 11 5 - 15  Brain natriuretic peptide     Status: Abnormal   Collection Time: 06/01/15 12:50 PM  Result Value  Ref Range   B Natriuretic Peptide 286.8 (H) 0.0 - 100.0 pg/mL  I-stat troponin, ED     Status: None   Collection Time: 06/01/15 12:58 PM  Result Value Ref Range   Troponin i, poc 0.00 0.00 - 0.08 ng/mL   Comment 3            Comment: Due to the release kinetics of cTnI, a negative result within the first hours of the onset of symptoms does not rule out myocardial infarction with certainty. If myocardial infarction is still suspected, repeat the test at appropriate intervals.    Dg Chest 2 View  06/01/2015  CLINICAL DATA:  Patient C/O dyspnea that worsened this AM. She was going to PCP but called EMS instead due to dyspnea. Patient recently diagnosed with A-fib by her PCP. EXAM: CHEST  2 VIEW COMPARISON:  05/21/2014 FINDINGS: Midline trachea. Mild cardiomegaly with aortic atherosclerosis. Small, left greater than right pleural effusions. No pneumothorax. Mild to moderate interstitial prominence and indistinctness. This is asymmetric, greater on the right. Concurrent right perihilar and  bibasilar airspace opacities. IMPRESSION: Cardiomegaly with moderate interstitial prominence, suspicious for pulmonary edema. Concurrent bilateral pleural effusions. Bibasilar airspace disease, greater on the left. This could represent atelectasis or concurrent infection. Aortic atherosclerosis. Electronically Signed   By: Abigail Miyamoto M.D.   On: 06/01/2015 13:01    Review Of Systems Respiratory: Positive for shortness of breath.  Cardiovascular: Positive for palpitations.  All other systems reviewed and are negative.  Blood pressure 182/125, pulse 117, temperature 98.1 F (36.7 C), temperature source Oral, resp. rate 32, height 5' (1.524 m), weight 56.7 kg (125 lb), SpO2 95 %. Physical Exam  Constitutional: She appears short and well-nourished.  HENT: Head: Normocephalic and atraumatic. Wears glasses. Conj-pink, Sclera-white.Pupils are equal, round, and reactive to light. Mouth/Throat: Oropharynx is clear and moist.  Cardiovascular: Normal heart sounds. An irregularly irregular rhythm present. Tachycardia present.  Pulmonary/Chest: Effort normal and breath sounds normal. moderate respiratory distress. She has no wheezes. Speaking in short truncated sentences.  Abdominal: Soft. Bowel sounds are normal.  Musculoskeletal: Normal range of motion.  Neurological: She is alert and oriented to person, place, and time. Moves all 4 extremities. Skin: Skin is warm and dry.  Psychiatric: She has a normal mood and affect.  Nursing note and vitals reviewed.  Assessment/Plan Acute left heart systolic failure with pulmonary edema Atrial fibrillation with RVR, CHA2DSVASc score of 4/9  Admit to stepdown IV Lasix IV diltiazem Full code per patient.  Birdie Riddle, MD  06/01/2015, 2:36 PM

## 2015-06-01 NOTE — ED Notes (Signed)
Patient C/O dyspnea that worsened this AM.  She was going to PCP but called EMS instead due to dyspnea.  Patient recently diagnosed with A-fib by her PCP. BP 176/110 per EMS

## 2015-06-02 LAB — BASIC METABOLIC PANEL
Anion gap: 9 (ref 5–15)
BUN: 13 mg/dL (ref 6–20)
CO2: 23 mmol/L (ref 22–32)
Calcium: 8.3 mg/dL — ABNORMAL LOW (ref 8.9–10.3)
Chloride: 97 mmol/L — ABNORMAL LOW (ref 101–111)
Creatinine, Ser: 0.71 mg/dL (ref 0.44–1.00)
GFR calc Af Amer: >60 mL/min (ref >60)
GFR calc non Af Amer: >60 mL/min (ref >60)
Glucose, Bld: 124 mg/dL — ABNORMAL HIGH (ref 65–99)
Potassium: 3.4 mmol/L — ABNORMAL LOW (ref 3.5–5.1)
Sodium: 129 mmol/L — ABNORMAL LOW (ref 135–145)

## 2015-06-02 LAB — TROPONIN I: Troponin I: <0.03 ng/mL (ref <0.031)

## 2015-06-02 MED ORDER — DILTIAZEM HCL 60 MG PO TABS
60.0000 mg | ORAL_TABLET | Freq: Four times a day (QID) | ORAL | Status: DC
  Administered 2015-06-02 – 2015-06-06 (×17): 60 mg via ORAL
  Filled 2015-06-02 (×17): qty 1

## 2015-06-02 MED ORDER — POTASSIUM CHLORIDE ER 10 MEQ PO TBCR
20.0000 meq | EXTENDED_RELEASE_TABLET | Freq: Three times a day (TID) | ORAL | Status: AC
  Administered 2015-06-02 – 2015-06-03 (×6): 20 meq via ORAL
  Filled 2015-06-02 (×11): qty 2

## 2015-06-02 MED ORDER — GUAIFENESIN-CODEINE 100-10 MG/5ML PO SOLN
10.0000 mL | ORAL | Status: DC
  Administered 2015-06-02: 2.5 mL via ORAL
  Filled 2015-06-02: qty 10

## 2015-06-02 MED ORDER — DEXTROSE 5 % IV SOLN
1.0000 g | INTRAVENOUS | Status: DC
  Administered 2015-06-02 – 2015-06-07 (×6): 1 g via INTRAVENOUS
  Filled 2015-06-02 (×6): qty 10

## 2015-06-02 MED ORDER — GUAIFENESIN-CODEINE 100-10 MG/5ML PO SOLN: 5.0000 mL | ORAL | Status: DC | PRN

## 2015-06-02 MED ORDER — FUROSEMIDE 10 MG/ML IJ SOLN
40.0000 mg | Freq: Two times a day (BID) | INTRAMUSCULAR | Status: DC
  Administered 2015-06-02 – 2015-06-06 (×10): 40 mg via INTRAVENOUS
  Filled 2015-06-02 (×10): qty 4

## 2015-06-02 MED ORDER — GUAIFENESIN-DM 100-10 MG/5ML PO SYRP
5.0000 mL | ORAL_SOLUTION | ORAL | Status: DC | PRN
  Administered 2015-06-02 – 2015-06-03 (×3): 5 mL via ORAL
  Filled 2015-06-02 (×3): qty 5

## 2015-06-02 NOTE — Progress Notes (Signed)
Ref: [name] PeckOBERTS, [name] WAYNE, MD   Subjective:  Feeling better. Fair urine output. Normal systolic function with severe MR and TR and dilated LA and RA on echocardiogram.  Objective:  Vital Signs in the last 24 hours: Temp:  [97.2 F (36.2 C)-98.6 F (37 C)] 97.3 F (36.3 C) (11/10 1940) Cardiac Rhythm:  [-] Atrial fibrillation (11/10 2132) Resp:  [18-37] 37 (11/10 2200) BP: (96-138)/(59-92) 137/85 mmHg (11/10 2200) SpO2:  [89 %-98 %] 96 % (11/10 2200) Weight:  [57.8 kg (127 lb 6.8 oz)] 57.8 kg (127 lb 6.8 oz) (11/10 0426)  Physical Exam: BP Readings from Last 1 Encounters:  06/02/15 137/85     Wt Readings from Last 1 Encounters:  06/02/15 57.8 kg (127 lb 6.8 oz)    Weight change:   HEENT: [place]/AT, Eyes- PERL, EOMI, Conjunctiva-Pink, Sclera-Non-icteric Neck: No JVD, No bruit, Trachea midline. Lungs:  Clear, Bilateral. Cardiac:  Regular rhythm, normal S1 and S2, no S3. III/VI systolic murmur. Abdomen:  Soft, non-tender. Extremities:  No edema present. No cyanosis. No clubbing. CNS: AxOx3, Cranial nerves grossly intact, moves all 4 extremities. Right handed. Skin: Warm and dry.   Intake/Output from previous day: 11/09 0701 - 11/10 0700 In: 364.8 [P.O.:240; I.V.:124.8] Out: 2650 [Urine:2650]    Lab Results: BMET    Component Value Date/Time   NA 129* 06/02/2015 0229   NA 133* 06/01/2015 1250   K 3.4* 06/02/2015 0229   K 4.4 06/01/2015 1250   CL 97* 06/02/2015 0229   CL 99* 06/01/2015 1250   CO2 23 06/02/2015 0229   CO2 23 06/01/2015 1250   GLUCOSE 124* 06/02/2015 0229   GLUCOSE 138* 06/01/2015 1250   BUN 13 06/02/2015 0229   BUN 14 06/01/2015 1250   CREATININE 0.71 06/02/2015 0229   CREATININE 0.63 06/01/2015 1250   CALCIUM 8.3* 06/02/2015 0229   CALCIUM 9.0 06/01/2015 1250   GFRNONAA >60 06/02/2015 0229   GFRNONAA >60 06/01/2015 1250   GFRAA >60 06/02/2015 0229   GFRAA >60 06/01/2015 1250   CBC    Component Value Date/Time   WBC 9.8 06/01/2015 1250   RBC 3.54* 06/01/2015 1250   HGB 10.5* 06/01/2015 1250   HCT 31.1* 06/01/2015 1250   PLT 279 06/01/2015 1250   MCV 87.9 06/01/2015 1250   MCH 29.7 06/01/2015 1250   MCHC 33.8 06/01/2015 1250   RDW 13.5 06/01/2015 1250   LYMPHSABS 0.7 06/01/2015 1250   MONOABS 0.4 06/01/2015 1250   EOSABS 0.1 06/01/2015 1250   BASOSABS 0.0 06/01/2015 1250   HEPATIC Function Panel No results for input(s): PROT in the last 8760 hours.  Invalid input(s):  ALBUMIN,  AST,  ALT,  ALKPHOS,  BILIDIR,  IBILI HEMOGLOBIN A1C No components found for: HGA1C,  MPG CARDIAC ENZYMES Lab Results  Component Value Date   TROPONINI <0.03 06/02/2015   TROPONINI <0.03 06/01/2015   TROPONINI <0.03 06/01/2015   BNP No results for input(s): PROBNP in the last 8760 hours. TSH No results for input(s): TSH in the last 8760 hours. CHOLESTEROL No results for input(s): CHOL in the last 8760 hours.  Scheduled Meds: . antiseptic oral rinse  7 mL Mouth Rinse BID  . cefTRIAXone (ROCEPHIN)  IV  1 g Intravenous Q24H  . diltiazem  60 mg Oral 4 times per day  . furosemide  40 mg Intravenous BID  . latanoprost  1 drop Both Eyes QHS  . metoprolol succinate  50 mg Oral Daily  . multivitamin with minerals  1 tablet Oral  Daily  . off the beat book   Does not apply Once  . potassium chloride  20 mEq Oral TID  . rivaroxaban  15 mg Oral Q supper  . sodium chloride  3 mL Intravenous Q12H  . timolol  1 drop Both Eyes Daily  . vitamin C  500 mg Oral Daily   Continuous Infusions:  PRN Meds:.sodium chloride, acetaminophen, guaiFENesin-codeine, guaiFENesin-dextromethorphan, ondansetron (ZOFRAN) IV, sodium chloride  Assessment/Plan: Acute left heart systolic failure with pulmonary edema Atrial fibrillation with RVR, CHA2DSVASc score of 4/9  Continue diuresis with potassium suplementation. PO diltiazem.    LOS: 1 day    [name]  MD  06/02/2015, 11:14 PM

## 2015-06-02 NOTE — Progress Notes (Signed)
Utilization review completed. Eugenia Eldredge, RN, BSN. 

## 2015-06-03 LAB — BASIC METABOLIC PANEL
Anion gap: 8 (ref 5–15)
BUN: 13 mg/dL (ref 6–20)
CO2: 25 mmol/L (ref 22–32)
Calcium: 8 mg/dL — ABNORMAL LOW (ref 8.9–10.3)
Chloride: 97 mmol/L — ABNORMAL LOW (ref 101–111)
Creatinine, Ser: 0.7 mg/dL (ref 0.44–1.00)
GFR calc Af Amer: >60 mL/min (ref >60)
GFR calc non Af Amer: >60 mL/min (ref >60)
Glucose, Bld: 101 mg/dL — ABNORMAL HIGH (ref 65–99)
Potassium: 3.7 mmol/L (ref 3.5–5.1)
Sodium: 130 mmol/L — ABNORMAL LOW (ref 135–145)

## 2015-06-03 MED ORDER — HYDRALAZINE HCL 10 MG PO TABS
10.0000 mg | ORAL_TABLET | Freq: Four times a day (QID) | ORAL | Status: DC
  Administered 2015-06-03 – 2015-06-05 (×6): 10 mg via ORAL
  Filled 2015-06-03 (×7): qty 1

## 2015-06-03 MED ORDER — AMIODARONE HCL 200 MG PO TABS
400.0000 mg | ORAL_TABLET | Freq: Every day | ORAL | Status: DC
  Administered 2015-06-03 – 2015-06-05 (×3): 400 mg via ORAL
  Filled 2015-06-03 (×3): qty 2

## 2015-06-03 NOTE — Progress Notes (Signed)
Ref: Lorenda PeckOBERTS, RONALD WAYNE, MD   Subjective:  Slow and steady improvement. No chest pain. +ve cough.  Objective:  Vital Signs in the last 24 hours: Temp:  [97.2 F (36.2 C)-98.9 F (37.2 C)] 97.3 F (36.3 C) (11/11 0836) Cardiac Rhythm:  [-] Atrial fibrillation (11/11 0800) Resp:  [15-37] 22 (11/11 0800) BP: (113-137)/(60-92) 127/78 mmHg (11/11 0800) SpO2:  [89 %-100 %] 100 % (11/11 0800) Weight:  [57.425 kg (126 lb 9.6 oz)] 57.425 kg (126 lb 9.6 oz) (11/11 0413)  Physical Exam: BP Readings from Last 1 Encounters:  06/03/15 127/78     Wt Readings from Last 1 Encounters:  06/03/15 57.425 kg (126 lb 9.6 oz)    Weight change: 0.726 kg (1 lb 9.6 oz)  HEENT: [place]/AT, Eyes-PERL, EOMI, Conjunctiva-Pink, Sclera-Non-icteric Neck: No JVD, No bruit, Trachea midline. Lungs:  Clearing, Bilateral. Cardiac:  Regular rhythm, normal S1 and S2, no S3. III/VI systolic murmur. Abdomen:  Soft, non-tender. Extremities:  No edema present. No cyanosis. No clubbing. CNS: AxOx3, Cranial nerves grossly intact, moves all 4 extremities. Right handed. Skin: Warm and dry.   Intake/Output from previous day: 11/10 0701 - 11/11 0700 In: 968.6 [P.O.:620; I.V.:298.6; IV Piggyback:50] Out: 874 [Urine:874]    Lab Results: BMET    Component Value Date/Time   NA 130* 06/03/2015 0558   NA 129* 06/02/2015 0229   NA 133* 06/01/2015 1250   K 3.7 06/03/2015 0558   K 3.4* 06/02/2015 0229   K 4.4 06/01/2015 1250   CL 97* 06/03/2015 0558   CL 97* 06/02/2015 0229   CL 99* 06/01/2015 1250   CO2 25 06/03/2015 0558   CO2 23 06/02/2015 0229   CO2 23 06/01/2015 1250   GLUCOSE 101* 06/03/2015 0558   GLUCOSE 124* 06/02/2015 0229   GLUCOSE 138* 06/01/2015 1250   BUN 13 06/03/2015 0558   BUN 13 06/02/2015 0229   BUN 14 06/01/2015 1250   CREATININE 0.70 06/03/2015 0558   CREATININE 0.71 06/02/2015 0229   CREATININE 0.63 06/01/2015 1250   CALCIUM 8.0* 06/03/2015 0558   CALCIUM 8.3* 06/02/2015 0229   CALCIUM  9.0 06/01/2015 1250   GFRNONAA >60 06/03/2015 0558   GFRNONAA >60 06/02/2015 0229   GFRNONAA >60 06/01/2015 1250   GFRAA >60 06/03/2015 0558   GFRAA >60 06/02/2015 0229   GFRAA >60 06/01/2015 1250   CBC    Component Value Date/Time   WBC 9.8 06/01/2015 1250   RBC 3.54* 06/01/2015 1250   HGB 10.5* 06/01/2015 1250   HCT 31.1* 06/01/2015 1250   PLT 279 06/01/2015 1250   MCV 87.9 06/01/2015 1250   MCH 29.7 06/01/2015 1250   MCHC 33.8 06/01/2015 1250   RDW 13.5 06/01/2015 1250   LYMPHSABS 0.7 06/01/2015 1250   MONOABS 0.4 06/01/2015 1250   EOSABS 0.1 06/01/2015 1250   BASOSABS 0.0 06/01/2015 1250   HEPATIC Function Panel No results for input(s): PROT in the last 8760 hours.  Invalid input(s):  ALBUMIN,  AST,  ALT,  ALKPHOS,  BILIDIR,  IBILI HEMOGLOBIN A1C No components found for: HGA1C,  MPG CARDIAC ENZYMES Lab Results  Component Value Date   TROPONINI <0.03 06/02/2015   TROPONINI <0.03 06/01/2015   TROPONINI <0.03 06/01/2015   BNP No results for input(s): PROBNP in the last 8760 hours. TSH No results for input(s): TSH in the last 8760 hours. CHOLESTEROL No results for input(s): CHOL in the last 8760 hours.  Scheduled Meds: . antiseptic oral rinse  7 mL Mouth Rinse BID  .  cefTRIAXone (ROCEPHIN)  IV  1 g Intravenous Q24H  . diltiazem  60 mg Oral 4 times per day  . furosemide  40 mg Intravenous BID  . latanoprost  1 drop Both Eyes QHS  . metoprolol succinate  50 mg Oral Daily  . multivitamin with minerals  1 tablet Oral Daily  . off the beat book   Does not apply Once  . potassium chloride  20 mEq Oral TID  . rivaroxaban  15 mg Oral Q supper  . sodium chloride  3 mL Intravenous Q12H  . timolol  1 drop Both Eyes Daily  . vitamin C  500 mg Oral Daily   Continuous Infusions:  PRN Meds:.sodium chloride, acetaminophen, guaiFENesin-codeine, guaiFENesin-dextromethorphan, ondansetron (ZOFRAN) IV, sodium chloride  Assessment/Plan: Acute left heart systolic failure with  pulmonary edema Atrial fibrillation with RVR, CHA2DSVASc score of 4/9  IV Rocephin. Increase ambulation as tolerated.   LOS: 2 days    [name]  MD  06/03/2015, 9:51 AM

## 2015-06-03 NOTE — Progress Notes (Signed)
Ref: Lorenda Peck, MD   Subjective:  Has questions on care. Sitting up, breathing and full sentence talking improved. HR still in 100-110 range.  Objective:  Vital Signs in the last 24 hours: Temp:  [97.3 F (36.3 C)-98.9 F (37.2 C)] 98 F (36.7 C) (11/11 1633) Cardiac Rhythm:  [-] Atrial fibrillation (11/11 0800) Resp:  [15-37] 18 (11/11 1633) BP: (116-146)/(59-97) 146/97 mmHg (11/11 1633) SpO2:  [89 %-100 %] 100 % (11/11 1633) Weight:  [57.425 kg (126 lb 9.6 oz)] 57.425 kg (126 lb 9.6 oz) (11/11 0413)    Intake/Output from previous day: 11/10 0701 - 11/11 0700 In: 968.6 [P.O.:620; I.V.:298.6; IV Piggyback:50] Out: 874 [Urine:874]    Lab Results: BMET    Component Value Date/Time   NA 130* 06/03/2015 0558   NA 129* 06/02/2015 0229   NA 133* 06/01/2015 1250   K 3.7 06/03/2015 0558   K 3.4* 06/02/2015 0229   K 4.4 06/01/2015 1250   CL 97* 06/03/2015 0558   CL 97* 06/02/2015 0229   CL 99* 06/01/2015 1250   CO2 25 06/03/2015 0558   CO2 23 06/02/2015 0229   CO2 23 06/01/2015 1250   GLUCOSE 101* 06/03/2015 0558   GLUCOSE 124* 06/02/2015 0229   GLUCOSE 138* 06/01/2015 1250   BUN 13 06/03/2015 0558   BUN 13 06/02/2015 0229   BUN 14 06/01/2015 1250   CREATININE 0.70 06/03/2015 0558   CREATININE 0.71 06/02/2015 0229   CREATININE 0.63 06/01/2015 1250   CALCIUM 8.0* 06/03/2015 0558   CALCIUM 8.3* 06/02/2015 0229   CALCIUM 9.0 06/01/2015 1250   GFRNONAA >60 06/03/2015 0558   GFRNONAA >60 06/02/2015 0229   GFRNONAA >60 06/01/2015 1250   GFRAA >60 06/03/2015 0558   GFRAA >60 06/02/2015 0229   GFRAA >60 06/01/2015 1250   CBC    Component Value Date/Time   WBC 9.8 06/01/2015 1250   RBC 3.54* 06/01/2015 1250   HGB 10.5* 06/01/2015 1250   HCT 31.1* 06/01/2015 1250   PLT 279 06/01/2015 1250   MCV 87.9 06/01/2015 1250   MCH 29.7 06/01/2015 1250   MCHC 33.8 06/01/2015 1250   RDW 13.5 06/01/2015 1250   LYMPHSABS 0.7 06/01/2015 1250   MONOABS 0.4  06/01/2015 1250   EOSABS 0.1 06/01/2015 1250   BASOSABS 0.0 06/01/2015 1250   HEPATIC Function Panel No results for input(s): PROT in the last 8760 hours.  Invalid input(s):  ALBUMIN,  AST,  ALT,  ALKPHOS,  BILIDIR,  IBILI HEMOGLOBIN A1C No components found for: HGA1C,  MPG CARDIAC ENZYMES Lab Results  Component Value Date   TROPONINI <0.03 06/02/2015   TROPONINI <0.03 06/01/2015   TROPONINI <0.03 06/01/2015   BNP No results for input(s): PROBNP in the last 8760 hours. TSH No results for input(s): TSH in the last 8760 hours. CHOLESTEROL No results for input(s): CHOL in the last 8760 hours.  Scheduled Meds: . amiodarone  400 mg Oral Daily  . antiseptic oral rinse  7 mL Mouth Rinse BID  . cefTRIAXone (ROCEPHIN)  IV  1 g Intravenous Q24H  . diltiazem  60 mg Oral 4 times per day  . furosemide  40 mg Intravenous BID  . latanoprost  1 drop Both Eyes QHS  . metoprolol succinate  50 mg Oral Daily  . multivitamin with minerals  1 tablet Oral Daily  . off the beat book   Does not apply Once  . potassium chloride  20 mEq Oral TID  . rivaroxaban  15 mg  Oral Q supper  . sodium chloride  3 mL Intravenous Q12H  . timolol  1 drop Both Eyes Daily  . vitamin C  500 mg Oral Daily   Continuous Infusions:  PRN Meds:.sodium chloride, acetaminophen, guaiFENesin-codeine, guaiFENesin-dextromethorphan, ondansetron (ZOFRAN) IV, sodium chloride  Assessment/Plan: Acute left heart diastolic failure with pulmonary edema Atrial fibrillation with RVR, CHA2DSVASc score of 4/9 Severe MR Severe TR Hyponatremia due to volume overload Acute bronchitis, possible pneumonia  Continue antibiotic. Continue lasix. Add amiodarone to improv HR control as she may not tolerate increase in metoprolol or diltiazem Add hydralazine to decrease MR, TR with vasodilator effect.  Dr. Sharyn LullHarwani covering for weekend.   LOS: 2 days    Orpah CobbAjay Leyani Gargus  MD  06/03/2015, 6:15 PM

## 2015-06-03 NOTE — Care Management Important Message (Signed)
Important Message  Patient Details  Name: [name] Jones MRN: [id number] Date of Birth: 10-08-1923   Medicare Important Message Given:  Yes    Kaivon Livesey P Rhilynn Preyer 06/03/2015, 2:13 PM

## 2015-06-03 NOTE — Clinical Documentation Improvement (Signed)
Cardiology  Abnormal Lab/Test Results:   Component      Sodium  Latest Ref Rng      135 - 145 mmol/L  06/01/2015     12:50 PM 133 (L)  06/02/2015      129 (L)  06/03/2015      130 (L)    Possible Clinical Conditions associated with below indicators  Hyponatremia  Other Condition  Cannot Clinically Determine  Please exercise your independent, professional judgment when responding. A specific answer is not anticipated or expected. Please update your documentation within the medical record to reflect your response to this query.   Thank you, Doy MinceVangela Saidi Santacroce, RN [phone] Clinical Documentation Specialist

## 2015-06-04 LAB — BASIC METABOLIC PANEL
Anion gap: 10 (ref 5–15)
BUN: 12 mg/dL (ref 6–20)
CO2: 26 mmol/L (ref 22–32)
Calcium: 7.9 mg/dL — ABNORMAL LOW (ref 8.9–10.3)
Chloride: 94 mmol/L — ABNORMAL LOW (ref 101–111)
Creatinine, Ser: 0.62 mg/dL (ref 0.44–1.00)
GFR calc Af Amer: >60 mL/min (ref >60)
GFR calc non Af Amer: >60 mL/min (ref >60)
Glucose, Bld: 94 mg/dL (ref 65–99)
Potassium: 3.6 mmol/L (ref 3.5–5.1)
Sodium: 130 mmol/L — ABNORMAL LOW (ref 135–145)

## 2015-06-04 LAB — EXPECTORATED SPUTUM ASSESSMENT W GRAM STAIN, RFLX TO RESP C: Special Requests: NORMAL

## 2015-06-04 NOTE — Progress Notes (Signed)
Subjective:  Patient denies any chest pain states breathing has improved.  Objective:  Vital Signs in the last 24 hours: Temp:  [98 F (36.7 C)-98.7 F (37.1 C)] 98.7 F (37.1 C) (11/12 0741) Pulse Rate:  [114] 114 (11/12 0741) Resp:  [15-26] 23 (11/12 0741) BP: (115-146)/(60-97) 130/68 mmHg (11/12 0741) SpO2:  [93 %-100 %] 97 % (11/12 0741) Weight:  [57.527 kg (126 lb 13.2 oz)] 57.527 kg (126 lb 13.2 oz) (11/12 0301)  Intake/Output from previous day: 11/11 0701 - 11/12 0700 In: 770 [P.O.:720; IV Piggyback:50] Out: 1975 [Urine:1975] Intake/Output from this shift: Total I/O In: 100 [P.O.:100] Out: 825 [Urine:825]  Physical Exam: Neck: no adenopathy, no carotid bruit, no JVD and supple, symmetrical, trachea midline Lungs: Decreased breath sound at bases with faint bibasilar rales Heart: irregularly irregular rhythm, S1, S2 normal and 3/6 systolic murmur noted Abdomen: soft, non-tender; bowel sounds normal; no masses,  no organomegaly Extremities: extremities normal, atraumatic, no cyanosis or edema  Lab Results:  Recent Labs  06/01/15 1250  WBC 9.8  HGB 10.5*  PLT 279    Recent Labs  06/03/15 0558 06/04/15 0253  NA 130* 130*  K 3.7 3.6  CL 97* 94*  CO2 25 26  GLUCOSE 101* 94  BUN 13 12  CREATININE 0.70 0.62    Recent Labs  06/01/15 2014 06/02/15 0229  TROPONINI <0.03 <0.03   Hepatic Function Panel No results for input(s): PROT, ALBUMIN, AST, ALT, ALKPHOS, BILITOT, BILIDIR, IBILI in the last 72 hours. No results for input(s): CHOL in the last 72 hours. No results for input(s): PROTIME in the last 72 hours.  Imaging: Imaging results have been reviewed and No results found.  Cardiac Studies:  Assessment/Plan:  Resolving decompensated congestive heart failure secondary to preserved LV systolic function Valvular heart disease Hypertension Chronic atrial fibrillation Acute bronchitis Plan Continue present management   LOS: 3 days    Rinaldo CloudHarwani,  Nazim Kadlec 06/04/2015, 11:35 AM

## 2015-06-05 MED ORDER — POTASSIUM CHLORIDE CRYS ER 20 MEQ PO TBCR
20.0000 meq | EXTENDED_RELEASE_TABLET | Freq: Every day | ORAL | Status: DC
  Administered 2015-06-05 – 2015-06-10 (×6): 20 meq via ORAL
  Filled 2015-06-05 (×6): qty 1

## 2015-06-05 MED ORDER — LOSARTAN POTASSIUM 25 MG PO TABS
25.0000 mg | ORAL_TABLET | Freq: Every day | ORAL | Status: DC
  Administered 2015-06-05 – 2015-06-10 (×6): 25 mg via ORAL
  Filled 2015-06-05 (×6): qty 1

## 2015-06-05 MED ORDER — AMIODARONE HCL 200 MG PO TABS
400.0000 mg | ORAL_TABLET | Freq: Two times a day (BID) | ORAL | Status: DC
  Administered 2015-06-05 – 2015-06-10 (×10): 400 mg via ORAL
  Filled 2015-06-05 (×10): qty 2

## 2015-06-05 NOTE — Progress Notes (Signed)
Subjective:  Complains of occasional tired feeling in chest also complains of exertional dyspnea with minimal exertion activity Limited. Occasional episodes of A. fib with RVR  Objective:  Vital Signs in the last 24 hours: Temp:  [98.2 F (36.8 C)-99.3 F (37.4 C)] 98.2 F (36.8 C) (11/13 0800) Pulse Rate:  [71-115] 92 (11/13 0917) Resp:  [15-27] 23 (11/13 0800) BP: (103-150)/(59-92) 116/80 mmHg (11/13 0917) SpO2:  [99 %-100 %] 99 % (11/13 0800) Weight:  [55.5 kg (122 lb 5.7 oz)] 55.5 kg (122 lb 5.7 oz) (11/13 0408)  Intake/Output from previous day: 11/12 0701 - 11/13 0700 In: 401 [P.O.:345; I.V.:6; IV Piggyback:50] Out: 2575 [Urine:2575] Intake/Output from this shift: Total I/O In: 6 [I.V.:6] Out: 350 [Urine:350]  Physical Exam: Neck: no adenopathy, no carotid bruit, no JVD and supple, symmetrical, trachea midline Lungs: Decreased breath sound at bases with faint rales Heart: irregularly irregular rhythm, S1, S2 normal and 3/6 systolic murmur noted Abdomen: soft, non-tender; bowel sounds normal; no masses,  no organomegaly Extremities: extremities normal, atraumatic, no cyanosis or edema  Lab Results: No results for input(s): WBC, HGB, PLT in the last 72 hours.  Recent Labs  06/03/15 0558 06/04/15 0253  NA 130* 130*  K 3.7 3.6  CL 97* 94*  CO2 25 26  GLUCOSE 101* 94  BUN 13 12  CREATININE 0.70 0.62   No results for input(s): TROPONINI in the last 72 hours.  Invalid input(s): CK, MB Hepatic Function Panel No results for input(s): PROT, ALBUMIN, AST, ALT, ALKPHOS, BILITOT, BILIDIR, IBILI in the last 72 hours. No results for input(s): CHOL in the last 72 hours. No results for input(s): PROTIME in the last 72 hours.  Imaging: Imaging results have been reviewed and No results found.  Cardiac Studies:  Assessment/Plan:  Exertional dyspnea/ chest discomfort rule out coronary insufficiency Resolving decompensated congestive heart failure secondary to preserved LV  systolic function Valvular heart disease Hypertension Chronic atrial fibrillation Acute bronchitis Plan Increase amiodarone to 400 mg twice daily Hold hydralazine in view of tachycardia Start losartan  25 mg daily Schedule for Lexiscan Myoview in a.m.  LOS: 4 days    Rinaldo CloudHarwani, Hydeia Mcatee 06/05/2015, 11:03 AM

## 2015-06-06 ENCOUNTER — Inpatient Hospital Stay (HOSPITAL_COMMUNITY): Payer: Medicare Other

## 2015-06-06 MED ORDER — BENZONATATE 100 MG PO CAPS
100.0000 mg | ORAL_CAPSULE | Freq: Two times a day (BID) | ORAL | Status: DC
  Administered 2015-06-06 – 2015-06-10 (×8): 100 mg via ORAL
  Filled 2015-06-06 (×8): qty 1

## 2015-06-06 MED ORDER — REGADENOSON 0.4 MG/5ML IV SOLN
0.4000 mg | Freq: Once | INTRAVENOUS | Status: AC
  Administered 2015-06-06: 0.4 mg via INTRAVENOUS
  Filled 2015-06-06: qty 5

## 2015-06-06 MED ORDER — MAGNESIUM HYDROXIDE 400 MG/5ML PO SUSP
30.0000 mL | Freq: Once | ORAL | Status: AC
  Administered 2015-06-06: 30 mL via ORAL
  Filled 2015-06-06: qty 30

## 2015-06-06 MED ORDER — DILTIAZEM HCL ER COATED BEADS 240 MG PO CP24
240.0000 mg | ORAL_CAPSULE | Freq: Every day | ORAL | Status: DC
  Administered 2015-06-07 – 2015-06-10 (×4): 240 mg via ORAL
  Filled 2015-06-06 (×4): qty 1

## 2015-06-06 MED ORDER — TECHNETIUM TC 99M SESTAMIBI GENERIC - CARDIOLITE
30.0000 | Freq: Once | INTRAVENOUS | Status: AC | PRN
  Administered 2015-06-06: 30 via INTRAVENOUS

## 2015-06-06 MED ORDER — FUROSEMIDE 40 MG PO TABS
40.0000 mg | ORAL_TABLET | Freq: Two times a day (BID) | ORAL | Status: DC
  Administered 2015-06-06 – 2015-06-10 (×8): 40 mg via ORAL
  Filled 2015-06-06 (×8): qty 1

## 2015-06-06 MED ORDER — REGADENOSON 0.4 MG/5ML IV SOLN
INTRAVENOUS | Status: AC
  Filled 2015-06-06: qty 5

## 2015-06-06 NOTE — Progress Notes (Signed)
Ref: [name] Jones, [name] WAYNE, MD   Subjective:  Still coughing. Some constipation. Chest x-ray improving aeration. No reversible ischemia on nuclear stress test.  Objective:  Vital Signs in the last 24 hours: Temp:  [98.8 F (37.1 C)-99.8 F (37.7 C)] 98.9 F (37.2 C) (11/14 1711) Pulse Rate:  [96-128] 96 (11/14 1711) Cardiac Rhythm:  [-] Atrial fibrillation (11/14 1711) Resp:  [15-27] 16 (11/14 1711) BP: (108-155)/(53-89) 110/53 mmHg (11/14 1711) SpO2:  [93 %-100 %] 95 % (11/14 1711) Weight:  [55.9 kg (123 lb 3.8 oz)] 55.9 kg (123 lb 3.8 oz) (11/14 0305)  Physical Exam: BP Readings from Last 1 Encounters:  06/06/15 110/53     Wt Readings from Last 1 Encounters:  06/06/15 55.9 kg (123 lb 3.8 oz)    Weight change: 0.4 kg (14.1 oz)  HEENT: [place]/AT, Eyes- Blue, PERL, EOMI, Conjunctiva-Pink, Sclera-Non-icteric Neck: No JVD, No bruit, Trachea midline. Lungs:  Clear, Bilateral. Cardiac:  Regular rhythm, normal S1 and S2, no S3.  Abdomen:  Soft, non-tender. Extremities:  No edema present. No cyanosis. No clubbing. CNS: AxOx3, Cranial nerves grossly intact, moves all 4 extremities. Right handed. Skin: Warm and dry.   Intake/Output from previous day: 11/13 0701 - 11/14 0700 In: 976 [P.O.:920; I.V.:6; IV Piggyback:50] Out: 1500 [Urine:1500]    Lab Results: BMET    Component Value Date/Time   NA 130* 06/04/2015 0253   NA 130* 06/03/2015 0558   NA 129* 06/02/2015 0229   K 3.6 06/04/2015 0253   K 3.7 06/03/2015 0558   K 3.4* 06/02/2015 0229   CL 94* 06/04/2015 0253   CL 97* 06/03/2015 0558   CL 97* 06/02/2015 0229   CO2 26 06/04/2015 0253   CO2 25 06/03/2015 0558   CO2 23 06/02/2015 0229   GLUCOSE 94 06/04/2015 0253   GLUCOSE 101* 06/03/2015 0558   GLUCOSE 124* 06/02/2015 0229   BUN 12 06/04/2015 0253   BUN 13 06/03/2015 0558   BUN 13 06/02/2015 0229   CREATININE 0.62 06/04/2015 0253   CREATININE 0.70 06/03/2015 0558   CREATININE 0.71 06/02/2015 0229   CALCIUM 7.9*  06/04/2015 0253   CALCIUM 8.0* 06/03/2015 0558   CALCIUM 8.3* 06/02/2015 0229   GFRNONAA >60 06/04/2015 0253   GFRNONAA >60 06/03/2015 0558   GFRNONAA >60 06/02/2015 0229   GFRAA >60 06/04/2015 0253   GFRAA >60 06/03/2015 0558   GFRAA >60 06/02/2015 0229   CBC    Component Value Date/Time   WBC 9.8 06/01/2015 1250   RBC 3.54* 06/01/2015 1250   HGB 10.5* 06/01/2015 1250   HCT 31.1* 06/01/2015 1250   PLT 279 06/01/2015 1250   MCV 87.9 06/01/2015 1250   MCH 29.7 06/01/2015 1250   MCHC 33.8 06/01/2015 1250   RDW 13.5 06/01/2015 1250   LYMPHSABS 0.7 06/01/2015 1250   MONOABS 0.4 06/01/2015 1250   EOSABS 0.1 06/01/2015 1250   BASOSABS 0.0 06/01/2015 1250   HEPATIC Function Panel No results for input(s): PROT in the last 8760 hours.  Invalid input(s):  ALBUMIN,  AST,  ALT,  ALKPHOS,  BILIDIR,  IBILI HEMOGLOBIN A1C No components found for: HGA1C,  MPG CARDIAC ENZYMES Lab Results  Component Value Date   TROPONINI <0.03 06/02/2015   TROPONINI <0.03 06/01/2015   TROPONINI <0.03 06/01/2015   BNP No results for input(s): PROBNP in the last 8760 hours. TSH No results for input(s): TSH in the last 8760 hours. CHOLESTEROL No results for input(s): CHOL in the last 8760 hours.  Scheduled Meds: .  amiodarone  400 mg Oral BID  . antiseptic oral rinse  7 mL Mouth Rinse BID  . benzonatate  100 mg Oral BID  . cefTRIAXone (ROCEPHIN)  IV  1 g Intravenous Q24H  . [START ON 06/07/2015] diltiazem  240 mg Oral Daily  . furosemide  40 mg Oral BID  . latanoprost  1 drop Both Eyes QHS  . losartan  25 mg Oral Daily  . metoprolol succinate  50 mg Oral Daily  . multivitamin with minerals  1 tablet Oral Daily  . off the beat book   Does not apply Once  . potassium chloride  20 mEq Oral Daily  . regadenoson      . rivaroxaban  15 mg Oral Q supper  . sodium chloride  3 mL Intravenous Q12H  . timolol  1 drop Both Eyes Daily  . vitamin C  500 mg Oral Daily   Continuous Infusions:  PRN  Meds:.sodium chloride, acetaminophen, guaiFENesin-codeine, guaiFENesin-dextromethorphan, ondansetron (ZOFRAN) IV, sodium chloride  Assessment/Plan: Acute left heart diastolic failure with pulmonary edema-improving Atrial fibrillation with RVR, CHA2DSVASc score of 4/9-improving heart rate Severe MR Severe TR Hyponatremia due to volume overload Acute bronchitis, possible pneumonia   Change lasix to oral.  Increase activity   LOS: 5 days    [name] Cobb  MD  06/06/2015, 6:57 PM

## 2015-06-06 NOTE — Care Management Important Message (Signed)
Important Message  Patient Details  Name: [name] Jones MRN: [id number] Date of Birth: 11/02/1923   Medicare Important Message Given:  Yes    Carola Viramontes P Kaylah Chiasson 06/06/2015, 1:31 PM

## 2015-06-06 NOTE — Progress Notes (Signed)
   06/06/15 1300  Clinical Encounter Type  Visited With Health care provider  Visit Type Other (Comment)  Referral From Nurse  Spiritual Encounters  Spiritual Needs Literature  Chaplain entered room with Pt sleeping; Chaplain spoke healthcare provider; Chaplain noticed that parent was not present for the AD; Chaplain will return to finalize AD

## 2015-06-06 NOTE — Progress Notes (Signed)
   06/06/15 1300  Clinical Encounter Type  Visited With Health care provider  Visit Type Other (Comment)  Referral From Nurse  Spiritual Encounters  Spiritual Needs Literature  Chaplain entered room with Pt was going for an exam; Chaplain spoke healthcare provider; Chaplain noticed that daughter was not present for the AD; Chaplain will return to finalize AD

## 2015-06-06 NOTE — Progress Notes (Signed)
   06/06/15 1453  Clinical Encounter Type  Visited With Patient and family together;Health care provider  Visit Type Initial  Referral From Family;Nurse  Spiritual Encounters  Spiritual Needs Literature   Chaplain helped facilitate the completion of an advanced directive. Chaplain support available as needed.   Alda PonderAdam M Jessyka Austria, Chaplain 06/06/2015 2:54 PM

## 2015-06-07 LAB — CBC
HCT: 28.7 % — ABNORMAL LOW (ref 36.0–46.0)
Hemoglobin: 9.6 g/dL — ABNORMAL LOW (ref 12.0–15.0)
MCH: 29.3 pg (ref 26.0–34.0)
MCHC: 33.4 g/dL (ref 30.0–36.0)
MCV: 87.5 fL (ref 78.0–100.0)
Platelets: 406 10*3/uL — ABNORMAL HIGH (ref 150–400)
RBC: 3.28 MIL/uL — ABNORMAL LOW (ref 3.87–5.11)
RDW: 13.7 % (ref 11.5–15.5)
WBC: 7.9 10*3/uL (ref 4.0–10.5)

## 2015-06-07 LAB — BASIC METABOLIC PANEL
Anion gap: 11 (ref 5–15)
BUN: 10 mg/dL (ref 6–20)
CO2: 29 mmol/L (ref 22–32)
Calcium: 8 mg/dL — ABNORMAL LOW (ref 8.9–10.3)
Chloride: 92 mmol/L — ABNORMAL LOW (ref 101–111)
Creatinine, Ser: 0.7 mg/dL (ref 0.44–1.00)
GFR calc Af Amer: >60 mL/min (ref >60)
GFR calc non Af Amer: >60 mL/min (ref >60)
Glucose, Bld: 106 mg/dL — ABNORMAL HIGH (ref 65–99)
Potassium: 3 mmol/L — ABNORMAL LOW (ref 3.5–5.1)
Sodium: 132 mmol/L — ABNORMAL LOW (ref 135–145)

## 2015-06-07 MED ORDER — LEVOFLOXACIN 500 MG PO TABS
500.0000 mg | ORAL_TABLET | Freq: Every day | ORAL | Status: DC
Start: 2015-06-08 — End: 2015-06-08
  Administered 2015-06-08: 500 mg via ORAL
  Filled 2015-06-07: qty 1

## 2015-06-07 MED ORDER — POTASSIUM CHLORIDE CRYS ER 20 MEQ PO TBCR
20.0000 meq | EXTENDED_RELEASE_TABLET | Freq: Once | ORAL | Status: AC
  Administered 2015-06-07: 20 meq via ORAL
  Filled 2015-06-07: qty 1

## 2015-06-07 NOTE — Evaluation (Signed)
Physical Therapy Evaluation Patient Details Name: [name] Jones MRN: [id number] DOB: 09/07/1923 Today's Date: 06/07/2015   History of Present Illness  Patient is a 81 y/o female with recently diagnosed A. fib by her PCP presents with SOB and palpitations. Found to be in A-fib with RVR rate in 110-120s on arrival, feeling better after IV lasix. Admitted with Acute left heart systolic failure with pulmonary edema. PMH includes HTN.  Clinical Impression  Patient presents with generalized weakness and decrease in oxygen saturation during ambulation impacting safe mobility. Sp02 decreased to 80% on RA. Hr ranged from 105-138 bpm A-fib. Pt with decreased endurance. Encourage ambulation 3x/day with RN to improve cardiovascular endurance and strength. Pt very independent PTA and has 24/7 caregivers at home for husband. Will follow acutely to maximize independence and mobility prior to return home.    Follow Up Recommendations Home health PT;Supervision/Assistance - 24 hour    Equipment Recommendations  None recommended by PT    Recommendations for Other Services OT consult     Precautions / Restrictions Precautions Precautions: None Precaution Comments: monitor 02 and HR Restrictions Weight Bearing Restrictions: No      Mobility  Bed Mobility Overal bed mobility: Needs Assistance             General bed mobility comments: Sitting in recliner upon PT arrival.   Transfers Overall transfer level: Needs assistance Equipment used: Rolling walker (2 wheeled) Transfers: Sit to/from Stand Sit to Stand: Supervision         General transfer comment: Supervision for safety. Transferred to chair post ambulation bout.  Ambulation/Gait Ambulation/Gait assistance: Min guard Ambulation Distance (Feet): 150 Feet Assistive device: Rolling walker (2 wheeled) Gait Pattern/deviations: Step-through pattern;Decreased stride length Gait velocity: decreased   General Gait Details: Slow,  steady gait. 2 standing rest breaks 2/2 to elevated HR and decrease in Sp02. Sp02 dropped to 80% on RA - resolved quickly with cues for breathing. HR 105-138 bpm A-fib.  Stairs            Wheelchair Mobility    Modified Rankin (Stroke Patients Only)       Balance Overall balance assessment: Needs assistance Sitting-balance support: Feet supported;No upper extremity supported Sitting balance-Leahy Scale: Good     Standing balance support: During functional activity Standing balance-Leahy Scale: Fair Standing balance comment: Able to stand unsupported, cannot tolerate challenge. Requires UE support for dynamic standing.                             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 Drives, cooks. Hires cleaning lady.     Hand Dominance        Extremity/Trunk Assessment   Upper Extremity Assessment: Defer to OT evaluation           Lower Extremity Assessment: Generalized weakness         Communication   Communication: No difficulties  Cognition Arousal/Alertness: Awake/alert Behavior During Therapy:  WFL for tasks assessed/performed Overall Cognitive Status: Within Functional Limits for tasks assessed                      General Comments General comments (skin integrity, edema, etc.): BP post ambulation 138/83; HR 105 bpm.    Exercises        Assessment/Plan    PT Assessment Patient needs continued  PT services  PT Diagnosis Generalized weakness   PT Problem List Decreased strength;Cardiopulmonary status limiting activity;Decreased balance;Decreased mobility;Decreased activity tolerance  PT Treatment Interventions Balance training;Gait training;Functional mobility training;Therapeutic activities;Therapeutic exercise;Patient/family education   PT Goals (Current goals can be found in the Care Plan section) Acute Rehab PT Goals Patient Stated Goal: to return to independence PT Goal Formulation: With patient Time For Goal Achievement: 06/21/15 Potential to Achieve Goals: Fair    Frequency Min 3X/week   Barriers to discharge        Co-evaluation               End of Session Equipment Utilized During Treatment: Gait belt Activity Tolerance: Patient tolerated treatment well;Treatment limited secondary to medical complications (Comment) (drop in Sp02) Patient left: in chair;with call bell/phone within reach Nurse Communication: Mobility status         Time: 4098-1191 PT Time Calculation (min) (ACUTE ONLY): 23 min   Charges:   PT Evaluation $Initial PT Evaluation Tier I: 1 Procedure PT Treatments $Gait Training: 8-22 mins   PT G Codes:        Greenleigh Kauth A Iqra Rotundo 06/07/2015, 10:27 AM Mylo Red, PT, DPT [phone]

## 2015-06-07 NOTE — Progress Notes (Signed)
Ref: Lorenda PeckOBERTS, RONALD WAYNE, MD   Subjective:  Feeling better. Ambulated some. Hypokalemia and hyponatremia on blood work.  Objective:  Vital Signs in the last 24 hours: Temp:  [97.8 F (36.6 C)-99.3 F (37.4 C)] 99.1 F (37.3 C) (11/15 2000) Pulse Rate:  [66-123] 109 (11/15 1213) Cardiac Rhythm:  [-] Atrial fibrillation (11/15 2015) Resp:  [15-24] 24 (11/15 2015) BP: (124-138)/(66-86) 131/66 mmHg (11/15 2000) SpO2:  [95 %-98 %] 96 % (11/15 2000) Weight:  [54.8 kg (120 lb 13 oz)] 54.8 kg (120 lb 13 oz) (11/15 0400)  Physical Exam: BP Readings from Last 1 Encounters:  06/07/15 131/66     Wt Readings from Last 1 Encounters:  06/07/15 54.8 kg (120 lb 13 oz)    Weight change: -1.1 kg (-2 lb 6.8 oz)  HEENT: [place]/AT, Eyes-Blue, PERL, EOMI, Conjunctiva-Pink, Sclera-Non-icteric Neck: No JVD, No bruit, Trachea midline. Lungs:  Clear, Bilateral. Cardiac:  Regular rhythm, normal S1 and S2, no S3.  Abdomen:  Soft, non-tender. Extremities:  No edema present. No cyanosis. No clubbing. CNS: AxOx3, Cranial nerves grossly intact, moves all 4 extremities. Right handed. Skin: Warm and dry.   Intake/Output from previous day: 11/14 0701 - 11/15 0700 In: 200 [P.O.:150; IV Piggyback:50] Out: 1151 [Urine:1150; Stool:1]    Lab Results: BMET    Component Value Date/Time   NA 132* 06/07/2015 0325   NA 130* 06/04/2015 0253   NA 130* 06/03/2015 0558   K 3.0* 06/07/2015 0325   K 3.6 06/04/2015 0253   K 3.7 06/03/2015 0558   CL 92* 06/07/2015 0325   CL 94* 06/04/2015 0253   CL 97* 06/03/2015 0558   CO2 29 06/07/2015 0325   CO2 26 06/04/2015 0253   CO2 25 06/03/2015 0558   GLUCOSE 106* 06/07/2015 0325   GLUCOSE 94 06/04/2015 0253   GLUCOSE 101* 06/03/2015 0558   BUN 10 06/07/2015 0325   BUN 12 06/04/2015 0253   BUN 13 06/03/2015 0558   CREATININE 0.70 06/07/2015 0325   CREATININE 0.62 06/04/2015 0253   CREATININE 0.70 06/03/2015 0558   CALCIUM 8.0* 06/07/2015 0325   CALCIUM 7.9*  06/04/2015 0253   CALCIUM 8.0* 06/03/2015 0558   GFRNONAA >60 06/07/2015 0325   GFRNONAA >60 06/04/2015 0253   GFRNONAA >60 06/03/2015 0558   GFRAA >60 06/07/2015 0325   GFRAA >60 06/04/2015 0253   GFRAA >60 06/03/2015 0558   CBC    Component Value Date/Time   WBC 7.9 06/07/2015 0325   RBC 3.28* 06/07/2015 0325   HGB 9.6* 06/07/2015 0325   HCT 28.7* 06/07/2015 0325   PLT 406* 06/07/2015 0325   MCV 87.5 06/07/2015 0325   MCH 29.3 06/07/2015 0325   MCHC 33.4 06/07/2015 0325   RDW 13.7 06/07/2015 0325   LYMPHSABS 0.7 06/01/2015 1250   MONOABS 0.4 06/01/2015 1250   EOSABS 0.1 06/01/2015 1250   BASOSABS 0.0 06/01/2015 1250   HEPATIC Function Panel No results for input(s): PROT in the last 8760 hours.  Invalid input(s):  ALBUMIN,  AST,  ALT,  ALKPHOS,  BILIDIR,  IBILI HEMOGLOBIN A1C No components found for: HGA1C,  MPG CARDIAC ENZYMES Lab Results  Component Value Date   TROPONINI <0.03 06/02/2015   TROPONINI <0.03 06/01/2015   TROPONINI <0.03 06/01/2015   BNP No results for input(s): PROBNP in the last 8760 hours. TSH No results for input(s): TSH in the last 8760 hours. CHOLESTEROL No results for input(s): CHOL in the last 8760 hours.  Scheduled Meds: . amiodarone  400  mg Oral BID  . antiseptic oral rinse  7 mL Mouth Rinse BID  . benzonatate  100 mg Oral BID  . cefTRIAXone (ROCEPHIN)  IV  1 g Intravenous Q24H  . diltiazem  240 mg Oral Daily  . furosemide  40 mg Oral BID  . latanoprost  1 drop Both Eyes QHS  . losartan  25 mg Oral Daily  . metoprolol succinate  50 mg Oral Daily  . multivitamin with minerals  1 tablet Oral Daily  . off the beat book   Does not apply Once  . potassium chloride  20 mEq Oral Daily  . rivaroxaban  15 mg Oral Q supper  . sodium chloride  3 mL Intravenous Q12H  . timolol  1 drop Both Eyes Daily  . vitamin C  500 mg Oral Daily   Continuous Infusions:  PRN Meds:.sodium chloride, acetaminophen, guaiFENesin-codeine,  guaiFENesin-dextromethorphan, ondansetron (ZOFRAN) IV, sodium chloride  Assessment/Plan: Acute left heart diastolic failure with pulmonary edema-improving Atrial fibrillation with RVR, CHA2DSVASc score of 4/9-improving heart rate Severe MR Severe TR Hyponatremia due to volume overload Acute bronchitis, possible pneumonia Hypokalemia Hyponatremia   Change IV antibiotic to oral. Social worker consult for SNF.   LOS: 6 days    [name]  MD  06/07/2015, 10:21 PM     Feeling better

## 2015-06-07 NOTE — Progress Notes (Signed)
K 3.0, orders to give Potassium 20meq PO now, and 20meq two hours later per Dr. Algie CofferKadakia.

## 2015-06-08 LAB — BASIC METABOLIC PANEL
Anion gap: 7 (ref 5–15)
BUN: 14 mg/dL (ref 6–20)
CO2: 30 mmol/L (ref 22–32)
Calcium: 8.2 mg/dL — ABNORMAL LOW (ref 8.9–10.3)
Chloride: 95 mmol/L — ABNORMAL LOW (ref 101–111)
Creatinine, Ser: 0.76 mg/dL (ref 0.44–1.00)
GFR calc Af Amer: >60 mL/min (ref >60)
GFR calc non Af Amer: >60 mL/min (ref >60)
Glucose, Bld: 103 mg/dL — ABNORMAL HIGH (ref 65–99)
Potassium: 4 mmol/L (ref 3.5–5.1)
Sodium: 132 mmol/L — ABNORMAL LOW (ref 135–145)

## 2015-06-08 MED ORDER — LEVOFLOXACIN 500 MG PO TABS
500.0000 mg | ORAL_TABLET | ORAL | Status: AC
  Administered 2015-06-10: 500 mg via ORAL
  Filled 2015-06-08: qty 1

## 2015-06-08 NOTE — Progress Notes (Signed)
Ambulated along the hallway for 5 min. With brief sob and take rest in bet. Ambulation.continue to monitor

## 2015-06-08 NOTE — Clinical Social Work Note (Signed)
CSW consult acknowledged:  Clinical Child psychotherapistocial Worker received consult for SNF placement. PT is currently recommending home health PT.   Clinical Social Worker will sign off for now as social work intervention is no longer needed. Please consult us again if new need arises.  Derenda FennelBashira Solenne Manwarren, MSW, LCSWA [phone] 06/08/2015 8:22 AM

## 2015-06-08 NOTE — Clinical Social Work Note (Signed)
Clinical Social Work Assessment  Patient Details  Name: [name] Jones MRN: [id number] Date of Birth: 14-Oct-1923  Date of referral:  06/08/15               Reason for consult:  Facility Placement                Permission sought to share information with:  Facility Medical sales representativeContact Representative Permission granted to share information::  Yes, Verbal Permission Granted  Name::     Kelton PillarCarolyn Jeffus Daughter [phone]  Agency::     Relationship::  SNF admissions  Contact Information:     Housing/Transportation Living arrangements for the past 2 months:  Single Family Home Source of Information:  Patient Patient Interpreter Needed:  None Criminal Activity/Legal Involvement Pertinent to Current Situation/Hospitalization:  No - Comment as needed Significant Relationships:  Adult Children, Spouse Lives with:  Spouse Do you feel safe going back to the place where you live?  Yes (Patient feels she is weak and needs some short term rehab before she is able to return back home.) Need for family participation in patient care:  No (Coment)  Care giving concerns: Patient requesting to go to SNF for short term rehab, due to patient being her husband's caregiver.   Social Worker assessment / plan:  Patient is a pleasant 81 year old female who is married and lives with her husband.  Patient expressed that she is the caregiver for him and she is too weak to return back home currently.  Patient states she has arranged care at home for him while she is in the hospital.  Patient's family are involved in patient's care and decision making.  Patient states she has been to rehab before and is aware of the process for looking for placement.  Patient  States she [facility] like [facility] [place] or [facility] if possible.  Patient is motivated and positive to get her strength back up in order to return back home.   Employment status:  Retired Health and safety inspectornsurance information:  Medicare PT Recommendations:  Skilled Nursing  Facility Information / Referral to community resources:     Patient/Family's Response to care:  Patient [facility] family [facility]  Patient/Family's Understanding of and Emotional Response to Diagnosis, Current Treatment, and Prognosis:  Patient aware of current treatment plan and diagnosis.  Emotional Assessment Appearance:  Appears stated age Attitude/Demeanor/Rapport:    Affect (typically observed):  Appropriate, Pleasant, Happy, Hopeful, Calm, Stable Orientation:  Oriented to Self, Oriented to Place, Oriented to  Time, Oriented to Situation Alcohol / Substance use:  Not Applicable Psych involvement (Current and /or in the community):  No (Comment)  Discharge Needs  Concerns to be addressed:  No discharge needs identified Readmission within the last 30 days:  No Current discharge risk:  Lack of support system (Patient is the caregiver for her husband, so he can not help take care of her right now.) Barriers to Discharge:  No Barriers Identified   Darleene Cleavernterhaus, Tamula Morrical R, LCSWA 06/08/2015, 6:08 PM

## 2015-06-08 NOTE — [place] FL2 (Signed)
[place] MEDICAID FL2 LEVEL OF CARE SCREENING TOOL     IDENTIFICATION  Patient Name: [name] Jones Birthdate: 11/29/1923 Sex: female Admission Date (Current Location): 06/01/2015  [facility] [place]   Facility and Address:  [facility], [address], [place], [place] [address]      Provider Number: [id number]  Attending Physician Name and Address:  Orpah CobbAjay Kadakia, MD  Relative Name and Phone Number:  Rockwell AlexandriaCarolynSmith, Daughter [phone]    Current Level of Care: Hospital Recommended Level of Care: Skilled Nursing Facility Prior Approval Number:    Date Approved/Denied:   PASRR Number: [id number]  Discharge Plan: SNF    Current Diagnoses: Patient Active Problem List   Diagnosis Date Noted  . Acute left systolic heart failure (HCC) 06/01/2015    Orientation ACTIVITIES/SOCIAL BLADDER RESPIRATION    Self, Time, Situation, Place    Continent Normal  BEHAVIORAL SYMPTOMS/MOOD NEUROLOGICAL BOWEL NUTRITION STATUS      Continent Diet (Carb modified)  PHYSICIAN VISITS COMMUNICATION OF NEEDS Height & Weight Skin    Verbally 5' (152.4 cm) 121 lbs. Normal          AMBULATORY STATUS RESPIRATION    Supervision limited Normal      Personal Care Assistance Level of Assistance  Bathing, Dressing Bathing Assistance: Limited assistance   Dressing Assistance: Limited assistance      Functional Limitations Info                SPECIAL CARE FACTORS FREQUENCY  PT (By licensed PT)     PT Frequency: 3x             Additional Factors Info  Code Status, Allergies Code Status Info: Full Allergies Info: No known allergies           Current Medications (06/08/2015): Current Facility-Administered Medications  Medication Dose Route Frequency Provider Last Rate Last Dose  . 0.9 %  sodium chloride infusion  250 mL Intravenous PRN Orpah CobbAjay Kadakia, MD      . acetaminophen (TYLENOL) tablet 650 mg  650 mg Oral Q4H PRN Orpah CobbAjay  Kadakia, MD   650 mg at 06/08/15 1250  . amiodarone (PACERONE) tablet 400 mg  400 mg Oral BID Rinaldo CloudMohan Harwani, MD   400 mg at 06/08/15 0904  . antiseptic oral rinse (CPC / CETYLPYRIDINIUM CHLORIDE 0.05%) solution 7 mL  7 mL Mouth Rinse BID Orpah CobbAjay Kadakia, MD   7 mL at 06/08/15 1000  . benzonatate (TESSALON) capsule 100 mg  100 mg Oral BID Orpah CobbAjay Kadakia, MD   100 mg at 06/08/15 0903  . diltiazem (CARDIZEM CD) 24 hr capsule 240 mg  240 mg Oral Daily Orpah CobbAjay Kadakia, MD   240 mg at 06/08/15 0903  . furosemide (LASIX) tablet 40 mg  40 mg Oral BID Orpah CobbAjay Kadakia, MD   40 mg at 06/08/15 1645  . guaiFENesin-codeine 100-10 MG/5ML solution 5 mL  5 mL Oral Q4H PRN Orpah CobbAjay Kadakia, MD      . guaiFENesin-dextromethorphan (ROBITUSSIN DM) 100-10 MG/5ML syrup 5 mL  5 mL Oral Q4H PRN Orpah CobbAjay Kadakia, MD   5 mL at 06/03/15 0623  . latanoprost (XALATAN) 0.005 % ophthalmic solution 1 drop  1 drop Both Eyes QHS Orpah CobbAjay Kadakia, MD   1 drop at 06/07/15 2251  . [START ON 06/10/2015] levofloxacin (LEVAQUIN) tablet 500 mg  500 mg Oral Q48H Earnie LarssonFrank R Wilson, Chi Health St. ElizabethRPH      . losartan (COZAAR) tablet 25 mg  25 mg Oral Daily Marlane MingleMohan  Harwani, MD   25 mg at 06/08/15 0903  . metoprolol succinate (TOPROL-XL) 24 hr tablet 50 mg  50 mg Oral Daily [name], MD   50 mg at 06/08/15 0903  . multivitamin with minerals tablet 1 tablet  1 tablet Oral Daily [name], MD   1 tablet at 06/08/15 0903  . off the beat book   Does not apply Once [name], MD      . ondansetron [facility]) injection 4 mg  4 mg Intravenous Q6H PRN [name], MD   4 mg at 06/04/15 2100  . potassium chloride SA (K-DUR,KLOR-CON) CR tablet 20 mEq  20 mEq Oral Daily Rinaldo Cloud, MD   20 mEq at 06/08/15 1000  . Rivaroxaban (XARELTO) tablet 15 mg  15 mg Oral Q supper [name], MD   15 mg at 06/08/15 1645  . sodium chloride 0.9 % injection 3 mL  3 mL Intravenous Q12H [name], MD   3 mL at 06/08/15 1000  . sodium chloride 0.9 % injection 3 mL  3 mL Intravenous PRN [name],  MD   3 mL at 06/05/15 0928  . timolol (TIMOPTIC) 0.5 % ophthalmic solution 1 drop  1 drop Both Eyes Daily [name], MD   1 drop at 06/08/15 0904  . vitamin C (ASCORBIC ACID) tablet 500 mg  500 mg Oral Daily [name], MD   500 mg at 06/08/15 1478   Do not use this list as official medication orders. Please verify with discharge summary.  Discharge Medications:   Medication List    ASK your doctor about these medications        amLODipine 5 MG tablet  Commonly known as:  NORVASC  Take 5 mg by mouth daily.     calcium carbonate 1250 (500 CA) MG tablet  Commonly known as:  OS-CAL - dosed in mg of elemental calcium  Take 1 tablet by mouth.     carteolol 1 % ophthalmic solution  Commonly known as:  OCUPRESS  Place 1 drop into both eyes 2 (two) times daily.     cholecalciferol 1000 UNITS tablet  Commonly known as:  VITAMIN D  Take 1,000 Units by mouth daily.     latanoprost 0.005 % ophthalmic solution  Commonly known as:  XALATAN  Place 1 drop into both eyes at bedtime.     metoprolol succinate 50 MG 24 hr tablet  Commonly known as:  TOPROL-XL  Take 50 mg by mouth daily. Take with or immediately following a meal.     multivitamin with minerals Tabs tablet  Take 1 tablet by mouth daily.     naproxen sodium 220 MG tablet  Commonly known as:  ANAPROX  Take 440 mg by mouth at bedtime.     rivaroxaban 20 MG Tabs tablet  Commonly known as:  XARELTO  Take 20 mg by mouth daily with supper.     vitamin C 500 MG tablet  Commonly known as:  ASCORBIC ACID  Take 500 mg by mouth daily.        Relevant Imaging Results:  Relevant Lab Results:  Recent Labs    Additional Information    Tricia Pledger, Ervin Knack, LCSWA

## 2015-06-08 NOTE — Clinical Social Work Placement (Signed)
   CLINICAL SOCIAL WORK PLACEMENT  NOTE  Date:  06/08/2015  Patient Details  Name: [name] Jones MRN: [id number] Date of Birth: [birth year]  Clinical Social Work is seeking post-discharge placement for this patient at the Skilled  Nursing Facility level of care (*CSW will initial, date and re-position this form in  chart as items are completed):  Yes   Patient/family provided with [facility] Clinical Social Work Department's list of facilities offering this level of care within the geographic area requested by the patient (or if unable, by the patient's family).  Yes   Patient/family informed of their freedom to choose among providers that offer the needed level of care, that participate in Medicare, Medicaid or managed care program needed by the patient, have an available bed and are willing to accept the patient.  Yes   Patient/family informed of [facility]'s ownership interest in [facility] and [facility], as well as of the fact that they are under no obligation to receive care at these facilities.  PASRR submitted to EDS on 06/08/15     PASRR number received on 06/08/15     Existing PASRR number confirmed on       FL2 transmitted to all facilities in geographic area requested by pt/family on 06/08/15     FL2 transmitted to all facilities within larger geographic area on       Patient informed that his/her managed care company has contracts with or will negotiate with certain facilities, including the following:            Patient/family informed of bed offers received.  Patient chooses bed at       Physician recommends and patient chooses bed at      Patient to be transferred to   on  .  Patient to be transferred to facility by       Patient family notified on   of transfer.  Name of family member notified:        PHYSICIAN Please sign FL2     Additional Comment:    _______________________________________________ Darleene CleaverAnterhaus, Paige Vanderwoude R,  LCSWA 06/08/2015, 6:24 PM

## 2015-06-08 NOTE — Progress Notes (Signed)
Ref: Lorenda PeckOBERTS, RONALD WAYNE, MD   Subjective:  Stable heart rate and blood pressure. Accepts SNF till gets strong enough to go home.  Objective:  Vital Signs in the last 24 hours: Temp:  [97.6 F (36.4 C)-99 F (37.2 C)] 97.6 F (36.4 C) (11/16 1933) Pulse Rate:  [78-119] 78 (11/16 2030) Cardiac Rhythm:  [-] Atrial fibrillation (11/16 1927) Resp:  [15-29] 18 (11/16 2030) BP: (100-131)/(62-82) 114/82 mmHg (11/16 1933) SpO2:  [95 %-99 %] 97 % (11/16 2030) Weight:  [55.157 kg (121 lb 9.6 oz)] 55.157 kg (121 lb 9.6 oz) (11/16 0416)  Physical Exam: BP Readings from Last 1 Encounters:  06/08/15 114/82     Wt Readings from Last 1 Encounters:  06/08/15 55.157 kg (121 lb 9.6 oz)    Weight change: 0.357 kg (12.6 oz)  HEENT: [place]/AT, Eyes-Blue, PERL, EOMI, Conjunctiva-Pink, Sclera-Non-icteric Neck: No JVD, No bruit, Trachea midline. Lungs:  Clear, Bilateral. Cardiac:  Regular rhythm, normal S1 and S2, no S3.  Abdomen:  Soft, non-tender. Extremities:  No edema present. No cyanosis. No clubbing. CNS: AxOx3, Cranial nerves grossly intact, moves all 4 extremities. Right handed. Skin: Warm and dry.   Intake/Output from previous day: 11/15 0701 - 11/16 0700 In: 530 [P.O.:480; IV Piggyback:50] Out: 350 [Urine:350]    Lab Results: BMET    Component Value Date/Time   NA 132* 06/08/2015 0245   NA 132* 06/07/2015 0325   NA 130* 06/04/2015 0253   K 4.0 06/08/2015 0245   K 3.0* 06/07/2015 0325   K 3.6 06/04/2015 0253   CL 95* 06/08/2015 0245   CL 92* 06/07/2015 0325   CL 94* 06/04/2015 0253   CO2 30 06/08/2015 0245   CO2 29 06/07/2015 0325   CO2 26 06/04/2015 0253   GLUCOSE 103* 06/08/2015 0245   GLUCOSE 106* 06/07/2015 0325   GLUCOSE 94 06/04/2015 0253   BUN 14 06/08/2015 0245   BUN 10 06/07/2015 0325   BUN 12 06/04/2015 0253   CREATININE 0.76 06/08/2015 0245   CREATININE 0.70 06/07/2015 0325   CREATININE 0.62 06/04/2015 0253   CALCIUM 8.2* 06/08/2015 0245   CALCIUM 8.0*  06/07/2015 0325   CALCIUM 7.9* 06/04/2015 0253   GFRNONAA >60 06/08/2015 0245   GFRNONAA >60 06/07/2015 0325   GFRNONAA >60 06/04/2015 0253   GFRAA >60 06/08/2015 0245   GFRAA >60 06/07/2015 0325   GFRAA >60 06/04/2015 0253   CBC    Component Value Date/Time   WBC 7.9 06/07/2015 0325   RBC 3.28* 06/07/2015 0325   HGB 9.6* 06/07/2015 0325   HCT 28.7* 06/07/2015 0325   PLT 406* 06/07/2015 0325   MCV 87.5 06/07/2015 0325   MCH 29.3 06/07/2015 0325   MCHC 33.4 06/07/2015 0325   RDW 13.7 06/07/2015 0325   LYMPHSABS 0.7 06/01/2015 1250   MONOABS 0.4 06/01/2015 1250   EOSABS 0.1 06/01/2015 1250   BASOSABS 0.0 06/01/2015 1250   HEPATIC Function Panel No results for input(s): PROT in the last 8760 hours.  Invalid input(s):  ALBUMIN,  AST,  ALT,  ALKPHOS,  BILIDIR,  IBILI HEMOGLOBIN A1C No components found for: HGA1C,  MPG CARDIAC ENZYMES Lab Results  Component Value Date   TROPONINI <0.03 06/02/2015   TROPONINI <0.03 06/01/2015   TROPONINI <0.03 06/01/2015   BNP No results for input(s): PROBNP in the last 8760 hours. TSH No results for input(s): TSH in the last 8760 hours. CHOLESTEROL No results for input(s): CHOL in the last 8760 hours.  Scheduled Meds: . amiodarone  400 mg Oral BID  . antiseptic oral rinse  7 mL Mouth Rinse BID  . benzonatate  100 mg Oral BID  . diltiazem  240 mg Oral Daily  . furosemide  40 mg Oral BID  . latanoprost  1 drop Both Eyes QHS  . [START ON 06/10/2015] levofloxacin  500 mg Oral Q48H  . losartan  25 mg Oral Daily  . metoprolol succinate  50 mg Oral Daily  . multivitamin with minerals  1 tablet Oral Daily  . off the beat book   Does not apply Once  . potassium chloride  20 mEq Oral Daily  . rivaroxaban  15 mg Oral Q supper  . sodium chloride  3 mL Intravenous Q12H  . timolol  1 drop Both Eyes Daily  . vitamin C  500 mg Oral Daily   Continuous Infusions:  PRN Meds:.sodium chloride, acetaminophen, guaiFENesin-codeine,  guaiFENesin-dextromethorphan, ondansetron (ZOFRAN) IV, sodium chloride  Assessment/Plan: Acute left heart diastolic failure with pulmonary edema-improving Atrial fibrillation with RVR, CHA2DSVASc score of 4/9-improving heart rate Severe MR Severe TR Hyponatremia due to volume overload Acute bronchitis, possible pneumonia Hypokalemia-resolved Hyponatremia- Improving   Awaiting SNF. Increase ambulation as tolerated.   LOS: 7 days    [name]  MD  06/08/2015, 9:37 PM

## 2015-06-08 NOTE — Care Management Note (Signed)
Case Management Note  Patient Details  Name: [name] Jones MRN: [id number] Date of Birth: [birth year]  Subjective/Objective:       Adm w heart failure             Action/Plan: pt lives w husband but pt provides all care. She has son and da in Social workerlaw but they all work. They hav some hired assist for husb and present but pt afraid if goes home husb will expect her to care for him before she is strong enough to provide that care. Pt would like short term placemnt at [facility]. Have asked sw to speak w pt. Explained to pt that phy there rec hhc and ins maynot cover snf. If goes home she would like few hrs notice since da in law and son at work.  Will alert ahc may need hhc and await sw to speak w pt also.   Expected Discharge Date:                  Expected Discharge Plan:     In-House Referral:  Clinical Social Work  Discharge planning Services  CM Consult  Post Acute Care Choice:  Home Health Choice offered to:  Patient  DME Arranged:    DME Agency:     HH Arranged:  PT, OT HH Agency:  [facility]  Status of Service:     Medicare Important Message Given:  Yes Date Medicare IM Given:    Medicare IM give by:    Date Additional Medicare IM Given:    Additional Medicare Important Message give by:     If discussed at Long Length of Stay Meetings, dates discussed:    Additional Comments: pt prefers snf short term at snf.  Hanley Haysowell, Edrees Valent T, RN 06/08/2015, 11:43 AM

## 2015-06-09 LAB — LIPID PANEL
Cholesterol: 161 mg/dL (ref 0–200)
HDL: 53 mg/dL (ref >40)
LDL Cholesterol: 93 mg/dL (ref 0–99)
Total CHOL/HDL Ratio: 3 RATIO
Triglycerides: 76 mg/dL (ref <150)
VLDL: 15 mg/dL (ref 0–40)

## 2015-06-09 LAB — IRON AND TIBC
Iron: 25 ug/dL — ABNORMAL LOW (ref 28–170)
Saturation Ratios: 7 % — ABNORMAL LOW (ref 10.4–31.8)
TIBC: 354 ug/dL (ref 250–450)
UIBC: 329 ug/dL

## 2015-06-09 LAB — FERRITIN: Ferritin: 54 ng/mL (ref 11–307)

## 2015-06-09 MED ORDER — FERROUS SULFATE 325 (65 FE) MG PO TABS
325.0000 mg | ORAL_TABLET | Freq: Every day | ORAL | Status: DC
  Administered 2015-06-10: 325 mg via ORAL
  Filled 2015-06-09: qty 1

## 2015-06-09 NOTE — Progress Notes (Signed)
Physical Therapy Treatment Patient Details Name: [name] Jones MRN: [id number] DOB: [birth year] Today's Date: 06/09/2015    History of Present Illness Patient is a 81 y/o female with recently diagnosed A. fib by her PCP presents with SOB and palpitations. Found to be in A-fib with RVR rate in 110-120s on arrival, feeling better after IV lasix. Admitted with Acute left heart systolic failure with pulmonary edema. PMH includes HTN.    PT Comments    Pt admitted with above diagnosis. Pt currently with functional limitations due to endurance deficits. Pt progressing with therapy and no longer needed O2 to ambulate as sats on RA 98-100%. Talked with pt regarding her concerns about husband at home and encouraged her to allow caregivers to take care of husband so she can rest and finish her therapy with HHPT.  Her balance with RW is good.   Pt will benefit from skilled PT to increase their independence and safety with mobility to allow discharge to the venue listed below.    Follow Up Recommendations  Home health PT;Supervision/Assistance - 24 hour     Equipment Recommendations  None recommended by PT    Recommendations for Other Services       Precautions / Restrictions Precautions Precautions: None Restrictions Weight Bearing Restrictions: No    Mobility  Bed Mobility               General bed mobility comments: Sitting in recliner upon PT arrival.   Transfers Overall transfer level: Independent                  Ambulation/Gait Ambulation/Gait assistance: Supervision Ambulation Distance (Feet): 350 Feet Assistive device: Rolling walker (2 wheeled) Gait Pattern/deviations: Step-through pattern;Decreased stride length Gait velocity: decreased   General Gait Details: Slow, steady gait. 1 standing rest break.  No desaturation on RA.  Pt ambulated last 50 feet without device and still maintained good gait pattern and steady on feet.  Pt prefers to use RW at present  and reports she has one at home.    Stairs            Wheelchair Mobility    Modified Rankin (Stroke Patients Only)       Balance Overall balance assessment: Modified Independent         Standing balance support: During functional activity;No upper extremity supported Standing balance-Leahy Scale: Fair Standing balance comment: can stand unsupported and accept min challenges to balance.                       Cognition Arousal/Alertness: Awake/alert Behavior During Therapy: WFL for tasks assessed/performed Overall Cognitive Status: Within Functional Limits for tasks assessed                      Exercises      General Comments        Pertinent Vitals/Pain Pain Assessment: No/denies pain  Hr 100-121 bpm.    Home Living                      Prior Function            PT Goals (current goals can now be found in the care plan section) Progress towards PT goals: Progressing toward goals    Frequency  Min 3X/week    PT Plan Current plan remains appropriate    Co-evaluation             End of  Session Equipment Utilized During Treatment: Gait belt Activity Tolerance: Patient tolerated treatment well Patient left: in chair;with call bell/phone within reach     Time: 0917-0934 PT Time Calculation (min) (ACUTE ONLY): 17 min  Charges:  $Gait Training: 8-22 mins                    G CodesBerline Lopes 06-10-2015, 10:41 AM [facility] [phone] (pager)

## 2015-06-09 NOTE — Care Management Important Message (Signed)
Important Message  Patient Details  Name: [name] Jones MRN: [id number] Date of Birth: 1923/11/17   Medicare Important Message Given:  Yes    Kamiya Acord P Kilea Mccarey 06/09/2015, 12:13 PM

## 2015-06-09 NOTE — Progress Notes (Signed)
Ref: [name] Jones, [name] WAYNE, MD   Subjective:  Heart rate and respiratory rate stable art rest.   Objective:  Vital Signs in the last 24 hours: Temp:  [97.5 F (36.4 C)-98.2 F (36.8 C)] 98.1 F (36.7 C) (11/17 1623) Pulse Rate:  [71-119] 83 (11/17 1614) Cardiac Rhythm:  [-] Atrial fibrillation (11/17 1614) Resp:  [15-19] 18 (11/17 1614) BP: (106-146)/(48-95) 146/95 mmHg (11/17 1614) SpO2:  [95 %-98 %] 98 % (11/17 1614) Weight:  [55.067 kg (121 lb 6.4 oz)] 55.067 kg (121 lb 6.4 oz) (11/17 0258)  Physical Exam: BP Readings from Last 1 Encounters:  06/09/15 146/95    Wt Readings from Last 1 Encounters:  06/09/15 55.067 kg (121 lb 6.4 oz)    Weight change: -0.091 kg (-3.2 oz)  HEENT: [place]/AT, Eyes-Blue, PERL, EOMI, Conjunctiva-Pink, Sclera-Non-icteric Neck: No JVD, No bruit, Trachea midline. Lungs:  Clear, Bilateral. Cardiac:  Regular rhythm, normal S1 and S2, no S3.  Abdomen:  Soft, non-tender. Extremities:  No edema present. No cyanosis. No clubbing. CNS: AxOx3, Cranial nerves grossly intact, moves all 4 extremities. Right handed. Skin: Warm and dry.   Intake/Output from previous day: 11/16 0701 - 11/17 0700 In: 1105 [P.O.:1105] Out: -     Lab Results: BMET    Component Value Date/Time   NA 132* 06/08/2015 0245   NA 132* 06/07/2015 0325   NA 130* 06/04/2015 0253   K 4.0 06/08/2015 0245   K 3.0* 06/07/2015 0325   K 3.6 06/04/2015 0253   CL 95* 06/08/2015 0245   CL 92* 06/07/2015 0325   CL 94* 06/04/2015 0253   CO2 30 06/08/2015 0245   CO2 29 06/07/2015 0325   CO2 26 06/04/2015 0253   GLUCOSE 103* 06/08/2015 0245   GLUCOSE 106* 06/07/2015 0325   GLUCOSE 94 06/04/2015 0253   BUN 14 06/08/2015 0245   BUN 10 06/07/2015 0325   BUN 12 06/04/2015 0253   CREATININE 0.76 06/08/2015 0245   CREATININE 0.70 06/07/2015 0325   CREATININE 0.62 06/04/2015 0253   CALCIUM 8.2* 06/08/2015 0245   CALCIUM 8.0* 06/07/2015 0325   CALCIUM 7.9* 06/04/2015 0253   GFRNONAA >60  06/08/2015 0245   GFRNONAA >60 06/07/2015 0325   GFRNONAA >60 06/04/2015 0253   GFRAA >60 06/08/2015 0245   GFRAA >60 06/07/2015 0325   GFRAA >60 06/04/2015 0253   CBC    Component Value Date/Time   WBC 7.9 06/07/2015 0325   RBC 3.28* 06/07/2015 0325   HGB 9.6* 06/07/2015 0325   HCT 28.7* 06/07/2015 0325   PLT 406* 06/07/2015 0325   MCV 87.5 06/07/2015 0325   MCH 29.3 06/07/2015 0325   MCHC 33.4 06/07/2015 0325   RDW 13.7 06/07/2015 0325   LYMPHSABS 0.7 06/01/2015 1250   MONOABS 0.4 06/01/2015 1250   EOSABS 0.1 06/01/2015 1250   BASOSABS 0.0 06/01/2015 1250   HEPATIC Function Panel No results for input(s): PROT in the last 8760 hours.  Invalid input(s):  ALBUMIN,  AST,  ALT,  ALKPHOS,  BILIDIR,  IBILI HEMOGLOBIN A1C No components found for: HGA1C,  MPG CARDIAC ENZYMES Lab Results  Component Value Date   TROPONINI <0.03 06/02/2015   TROPONINI <0.03 06/01/2015   TROPONINI <0.03 06/01/2015   BNP No results for input(s): PROBNP in the last 8760 hours. TSH No results for input(s): TSH in the last 8760 hours. CHOLESTEROL  Recent Labs  06/08/15 2322  CHOL 161    Scheduled Meds: . amiodarone  400 mg Oral BID  . antiseptic  oral rinse  7 mL Mouth Rinse BID  . benzonatate  100 mg Oral BID  . diltiazem  240 mg Oral Daily  . furosemide  40 mg Oral BID  . latanoprost  1 drop Both Eyes QHS  . [START ON 06/10/2015] levofloxacin  500 mg Oral Q48H  . losartan  25 mg Oral Daily  . metoprolol succinate  50 mg Oral Daily  . multivitamin with minerals  1 tablet Oral Daily  . off the beat book   Does not apply Once  . potassium chloride  20 mEq Oral Daily  . rivaroxaban  15 mg Oral Q supper  . sodium chloride  3 mL Intravenous Q12H  . timolol  1 drop Both Eyes Daily  . vitamin C  500 mg Oral Daily   Continuous Infusions:  PRN Meds:.sodium chloride, acetaminophen, guaiFENesin-codeine, guaiFENesin-dextromethorphan, ondansetron (ZOFRAN) IV, sodium  chloride  Assessment/Plan: Acute left heart diastolic failure with pulmonary edema-improving Atrial fibrillation with RVR, CHA2DSVASc score of 4/9-improving heart rate Severe MR Severe TR Hyponatremia due to volume overload Acute bronchitis, possible pneumonia Hypokalemia-resolved Hyponatremia- Improving  Iron deficiency anemia   Awaiting SNF placement. Increase activity as tolerated. Add iron pill.   LOS: 8 days    [name]  MD  06/09/2015, 6:01 PM

## 2015-06-10 MED ORDER — DILTIAZEM HCL ER COATED BEADS 240 MG PO CP24: 240.0000 mg | ORAL_CAPSULE | Freq: Every day | ORAL | Status: DC

## 2015-06-10 MED ORDER — GUAIFENESIN-DM 100-10 MG/5ML PO SYRP: 5.0000 mL | ORAL_SOLUTION | ORAL | Status: DC | PRN

## 2015-06-10 MED ORDER — POTASSIUM CHLORIDE CRYS ER 10 MEQ PO TBCR: 10.0000 meq | EXTENDED_RELEASE_TABLET | Freq: Two times a day (BID) | ORAL | Status: DC

## 2015-06-10 MED ORDER — FERROUS SULFATE 325 (65 FE) MG PO TABS: 325.0000 mg | ORAL_TABLET | Freq: Every day | ORAL | Status: DC

## 2015-06-10 MED ORDER — FUROSEMIDE 40 MG PO TABS: 40.0000 mg | ORAL_TABLET | Freq: Two times a day (BID) | ORAL | Status: DC

## 2015-06-10 MED ORDER — LISINOPRIL 2.5 MG PO TABS: 2.5000 mg | ORAL_TABLET | Freq: Every day | ORAL | Status: DC

## 2015-06-10 MED ORDER — BENZONATATE 100 MG PO CAPS: 100.0000 mg | ORAL_CAPSULE | Freq: Two times a day (BID) | ORAL | Status: DC

## 2015-06-10 MED ORDER — AMIODARONE HCL 200 MG PO TABS: 200.0000 mg | ORAL_TABLET | Freq: Every day | ORAL | Status: DC

## 2015-06-10 MED ORDER — LISINOPRIL 2.5 MG PO TABS
2.5000 mg | ORAL_TABLET | Freq: Every day | ORAL | Status: DC
  Administered 2015-06-10: 2.5 mg via ORAL
  Filled 2015-06-10: qty 1

## 2015-06-10 NOTE — Care Management Note (Addendum)
Case Management Note  Patient Details  Name: [name] Jones MRN: [id number] Date of Birth: Oct 22, 1923  Subjective/Objective:     Adm w heart failure               Action/Plan: lives w husb she provides care for, supp son and da in [place]   Expected Discharge Date:                  Expected Discharge Plan:     In-House Referral:  Clinical Social Work  Discharge planning Services  CM Consult  Post Acute Care Choice:  Home Health, Durable Medical Equipment Choice offered to:  Patient  DME Arranged:   DME Agency:    HH Arranged:  RN,PT, BATH AID HH Agency:  [facility]  Status of Service:     Medicare Important Message Given:  Yes Date Medicare IM Given:    Medicare IM give by:    Date Additional Medicare IM Given:    Additional Medicare Important Message give by:     If discussed at Long Length of Stay Meetings, dates discussed:    Additional Comments: pt and md have spoken and for dc home today. Pt doing ok w phy there and md feels she can go home. Have alerted jermaine w [facility] for rolator and will alert donna w [facility] for hhrn an hhpt  Da in law bought pt rolator on 11-17, have cancelled rolator order since pt now already has one.  alertd donna w [facility] of pt's disch.  Hanley Haysowell, Gina Costilla T, RN 06/10/2015, 10:42 AM

## 2015-06-10 NOTE — Discharge Summary (Signed)
Physician Discharge Summary  Patient ID: [name] Jones MRN: [id number] DOB/AGE: [birth year] 81 y.o.  Admit date: 06/01/2015 Discharge date: 06/10/2015  Admission Diagnoses: Acute left heart systolic failure with pulmonary edema Atrial fibrillation with RVR, CHA2DSVASc score of 4/9 Acute bronchitis, possible pneumonia  Discharge Diagnoses:  Principle Problem: * Acute left heart diastolic failure * Acute pulmonary edema with bilateral pleural effusion Atrial fibrillation with RVR, CHA2DSVASc score of 4/9 Severe MR Severe TR Hyponatremia due to volume overload Acute bronchitis, possible pneumonia Hypokalemia-resolved Iron deficiency anemia Generalized weakness Chronic glaucoma  Discharged Condition: stable  Hospital Course: 81 year old female with recently diagnosed A. fib by her PCP on Monday, presenting to the ED for shortness of breath. Patient states over the weekend she felt very "winded" with any type of exertional activity. She was started on xarelto and metoprolol and was supposed to have follow-up in 2 weeks. She had atrial fibrillation with RVR and pulmonary edema with bilateral pulmonary edema on chest x-ray. She gradually improved with IV lasix, IV followed by oral diltiazem, amiodarone and metoprolol. She also received IV antibiotic for bronchitis and possible pneumonia. Her Xarelto was continued. She has preserved LV systolic function with Severe MR and TR on echocardiogram. Due to advanced age and generalized weakness home health nurse, PT and aide will be arranged. She appears to have good family support who have arranged a sitter for husband and her. She will be followed by me in 1 week and by primary care in 1 month.   Consults: cardiology  Significant Diagnostic Studies: labs: Hgb 9.6 to 10.5, BMET showed low sodium gradually improving. Mild hyperglycemia, mildly elevated BNP of 286.8, Low iron level of 25 mcg. Normal Lipid panel.  Treatments: antibiotics:  ceftriaxone and cardiac meds: lisinopril (Zestril), metoprolol, diltiazem, furosemide, potassium and amiodarone.  EKG: Atrial fibrillation with RVR and low voltage.  Chest X-ray on admission: Cardiomegaly with moderate interstitial prominence, suspicious for pulmonary edema. Concurrent bilateral pleural effusions. Bibasilar airspace disease, greater on the left. This could represent atelectasis or concurrent infection.  Follow up chest X-ray on 06/01/2015 showed: significant resolution of previously seen edema. Small bilateral pleural effusions are noted. No new focal abnormality is seen.  Echocardiogram: - Left ventricle: The cavity size was normal. Systolic function was normal. The estimated ejection fraction was in the range of 60% to 65%. Wall motion was normal; there were no regional wall motion abnormalities. - Aortic valve: There was trivial regurgitation. - Mitral valve: Calcified annulus. There was severe regurgitation. - Left atrium: The atrium was moderately to severely dilated. - Right atrium: The atrium was moderately to severely dilated. - Tricuspid valve: There was severe regurgitation. - Pulmonary arteries: Systolic pressure was moderately to severely increased. - Pericardium, extracardiac: There was a right pleural effusion. There was a left pleural effusion.  Discharge Exam: Blood pressure 127/69, pulse 104, temperature 98.8 F (37.1 C), temperature source Oral, resp. rate 18, height 5' (1.524 m), weight 54.658 kg (120 lb 8 oz), SpO2 99 %. HEENT: [place]/AT, Eyes-Blue, PERL, EOMI, Conjunctiva-Pink, Sclera-Non-icteric Neck: No JVD, No bruit, Trachea midline. Lungs: Clear, Bilateral. Cardiac: Regular rhythm, normal S1 and S2, no S3.  Abdomen: Soft, non-tender. Extremities: No edema present. No cyanosis. No clubbing. CNS: AxOx3, Cranial nerves grossly intact, moves all 4 extremities. Right handed. Skin: Warm and dry.  Disposition: 01, Home or self care with home health  nurse, PT.     Medication List    STOP taking these medications  amLODipine 5 MG tablet  Commonly known as:  NORVASC     naproxen sodium 220 MG tablet  Commonly known as:  ANAPROX      TAKE these medications        amiodarone 200 MG tablet  Commonly known as:  PACERONE  Take 1 tablet (200 mg total) by mouth daily.     benzonatate 100 MG capsule  Commonly known as:  TESSALON  Take 1 capsule (100 mg total) by mouth 2 (two) times daily.     calcium carbonate 1250 (500 CA) MG tablet  Commonly known as:  OS-CAL - dosed in mg of elemental calcium  Take 1 tablet by mouth.     carteolol 1 % ophthalmic solution  Commonly known as:  OCUPRESS  Place 1 drop into both eyes 2 (two) times daily.     cholecalciferol 1000 UNITS tablet  Commonly known as:  VITAMIN D  Take 1,000 Units by mouth daily.     diltiazem 240 MG 24 hr capsule  Commonly known as:  CARDIZEM CD  Take 1 capsule (240 mg total) by mouth daily.     ferrous sulfate 325 (65 FE) MG tablet  Take 1 tablet (325 mg total) by mouth daily with breakfast.     furosemide 40 MG tablet  Commonly known as:  LASIX  Take 1 tablet (40 mg total) by mouth 2 (two) times daily.     guaiFENesin-dextromethorphan 100-10 MG/5ML syrup  Commonly known as:  ROBITUSSIN DM  Take 5 mLs by mouth every 4 (four) hours as needed for cough.     latanoprost 0.005 % ophthalmic solution  Commonly known as:  XALATAN  Place 1 drop into both eyes at bedtime.     metoprolol succinate 50 MG 24 hr tablet  Commonly known as:  TOPROL-XL  Take 50 mg by mouth daily. Take with or immediately following a meal.     multivitamin with minerals Tabs tablet  Take 1 tablet by mouth daily.     potassium chloride 10 MEQ tablet  Commonly known as:  K-DUR,KLOR-CON  Take 1 tablet (10 mEq total) by mouth 2 (two) times daily.     rivaroxaban 20 MG Tabs tablet  Commonly known as:  XARELTO  Take 20 mg by mouth daily with supper.     vitamin C 500 MG  tablet  Commonly known as:  ASCORBIC ACID  Take 500 mg by mouth daily.          Lisinopril 2.5 mg. one daily.     Follow-up Information    Follow up with [facility].   Why:  [facility] will call to set up appt for hhrn and hh phy ther   Contact information:   [address] [place] [address] [phone]       Follow up with ROBERTS, [name], MD. Schedule an appointment as soon as possible for a visit in 1 month.   Specialty:  Internal Medicine   Contact information:   [address] [place] [address] [phone]       Follow up with [facility] S, MD. Schedule an appointment as soon as possible for a visit in 1 week.   Specialty:  Cardiology   Contact information:   [address] [place] [address] [phone]       Signed: Ricki Rodriguez 06/10/2015, 11:13 AM

## 2015-06-10 NOTE — Progress Notes (Signed)
Pt and daughter in law given all discharge instructions with understanding verbalized.  Med schedule discussed in depth.  All belongings with pt. Pt without complaints, no distress.

## 2015-06-10 NOTE — Progress Notes (Signed)
Pt  walked around 22:00 yesterday  without difficulty using a front wheel walker; on RA Sats 98%; HR between 90's to 110's(A.Fib) ; with rest break 4x @ approx. 350 ft.

## 2015-06-10 NOTE — Clinical Social Work Note (Signed)
Clinical Social Worker continuing to follow patient and family for support and discharge planning needs.  Patient and patient daughter in law have spoken with MD and feel that patient will be able to manage at home and no longer in need of SNF placement.  Clinical Social Worker will sign off for now as social work intervention is no longer needed. Please consult us again if new need arises.  [name] Jones, [place] [phone]

## 2015-09-05 ENCOUNTER — Other Ambulatory Visit: Payer: Self-pay | Admitting: Cardiology

## 2015-09-05 ENCOUNTER — Ambulatory Visit (INDEPENDENT_AMBULATORY_CARE_PROVIDER_SITE_OTHER): Payer: Medicare Other | Admitting: Cardiology

## 2015-09-05 ENCOUNTER — Encounter: Payer: Self-pay | Admitting: Cardiology

## 2015-09-05 VITALS — BP 152/74 | HR 80 | Ht 60.0 in | Wt 116.4 lb

## 2015-09-05 DIAGNOSIS — I481 Persistent atrial fibrillation: Secondary | ICD-10-CM

## 2015-09-05 DIAGNOSIS — E871 Hypo-osmolality and hyponatremia: Secondary | ICD-10-CM

## 2015-09-05 DIAGNOSIS — I5032 Chronic diastolic (congestive) heart failure: Secondary | ICD-10-CM

## 2015-09-05 DIAGNOSIS — I10 Essential (primary) hypertension: Secondary | ICD-10-CM | POA: Diagnosis not present

## 2015-09-05 DIAGNOSIS — I4819 Other persistent atrial fibrillation: Secondary | ICD-10-CM

## 2015-09-05 DIAGNOSIS — I4891 Unspecified atrial fibrillation: Secondary | ICD-10-CM | POA: Insufficient documentation

## 2015-09-05 NOTE — Progress Notes (Signed)
Cardiology Office Note   Date:  09/05/2015   ID:  Jessicia, Napolitano Aug 17, 1923, MRN [id number]  PCP:  Lorenda Peck, MD  Cardiologist:   Peter Swaziland, MD   Chief Complaint  Patient presents with  . New Patient (Initial Visit)      no chest pain, shortness of breath-with exertion, occassional edema, no pain or cramping in legs, occassional lightheadedness or dizziness      History of Present Illness: [name] Jones is a 82 y.o. female who presents for evaluation of CHF and atrial fibrillation at the request of Dr. Su Hilt. I care for her husband [name] Jones. She has a history of HTN. In early November she was noted to have new onset atrial fibrillation. She was started on Xarelto. She deteriorated and was admitted with acute diastolic CHF. She was diuresed with IV lasix. She had hyponatremia. She had an Echo in May 2016 while in NSR showing Normal LV function with grade 2 diastolic dysfunction. There was moderate MR and mild to moderate TR. Mild pulmonary HTN. Repeat Echo in hospital showed normal LV function with severe MR and TR and mod to severe biatrial enlargement.  Since DC her breathing is better but she still has DOE and is "slow". Not as much energy. No chest pain. Mild ankle swelling. Weight at home has been stable. No chest pain or dizziness. Seen with daughter today.     Past Medical History  Diagnosis Date  . Hypertension   . Hyponatremia   . Chronic diastolic CHF (congestive heart failure) (HCC)   . Atrial fibrillation (HCC)   . Glaucoma   . Iron deficiency anemia   . Mitral insufficiency   . Tricuspid insufficiency   . TIA (transient ischemic attack) 1997    Past Surgical History  Procedure Laterality Date  . Abdominal hysterectomy    . Tonsillectomy    . Diagnostic laparoscopy    . Cataract extraction    . Strabismus surgery       Current Outpatient Prescriptions  Medication Sig Dispense Refill  . amiodarone (PACERONE) 200 MG tablet Take 1  tablet (200 mg total) by mouth daily. 30 tablet 3  . calcium carbonate (OS-CAL - DOSED IN MG OF ELEMENTAL CALCIUM) 1250 (500 CA) MG tablet Take 1 tablet by mouth.    . carteolol (OCUPRESS) 1 % ophthalmic solution Place 1 drop into both eyes 2 (two) times daily.    . cholecalciferol (VITAMIN D) 1000 UNITS tablet Take 1,000 Units by mouth daily.    . [employer] TUSSIN DM 100-10 MG/5ML liquid As needed TAKE (1TEASPOONFUL) BY MOUTH EVERY 4 (FOUR) HOURS AS NEEDED for cough  0  . diltiazem (CARDIZEM CD) 240 MG 24 hr capsule Take 1 capsule (240 mg total) by mouth daily. 30 capsule 3  . ferrous sulfate 325 (65 FE) MG tablet Take 1 tablet (325 mg total) by mouth daily with breakfast. 30 tablet 3  . furosemide (LASIX) 40 MG tablet Take 1 tablet (40 mg total) by mouth 2 (two) times daily. 60 tablet 3  . guaiFENesin-dextromethorphan (ROBITUSSIN DM) 100-10 MG/5ML syrup Take 5 mLs by mouth every 4 (four) hours as needed for cough. 118 mL 0  . latanoprost (XALATAN) 0.005 % ophthalmic solution Place 1 drop into both eyes at bedtime.    Marland Kitchen losartan (COZAAR) 50 MG tablet Take 1 tablet by mouth daily. Take 1 tab by mouth daily    . metoprolol succinate (TOPROL-XL) 50 MG 24 hr tablet Take  50 mg by mouth daily. Take with or immediately following a meal.    . Multiple Vitamin (MULTIVITAMIN WITH MINERALS) TABS tablet Take 1 tablet by mouth daily.    . mupirocin ointment (BACTROBAN) 2 % APPLY ON SKIN EVERY DAY  1  . potassium chloride SA (K-DUR,KLOR-CON) 10 MEQ tablet Take 1 tablet (10 mEq total) by mouth 2 (two) times daily. 60 tablet 3  . rivaroxaban (XARELTO) 20 MG TABS tablet Take 20 mg by mouth daily with supper.    . vitamin C (ASCORBIC ACID) 500 MG tablet Take 500 mg by mouth daily.     No current facility-administered medications for this visit.    Allergies:   Lisinopril    Social History:  The patient  reports that she has never smoked. She does not have any smokeless tobacco history on file. She reports that  she drinks alcohol. She reports that she does not use illicit drugs.   Family History:  The patient's family history includes Heart attack in her brother; Heart attack (age of onset: 22) in her son; Heart attack (age of onset: 41) in her father; Heart disease in her mother; Hypertension in her son; Ovarian cancer (age of onset: 43) in her mother.    ROS:  Please see the history of present illness.   Otherwise, review of systems are positive for none.   All other systems are reviewed and negative.    PHYSICAL EXAM: VS:  BP 152/74 mmHg  Pulse 80  Ht 5' (1.524 m)  Wt 52.816 kg (116 lb 7 oz)  BMI 22.74 kg/m2 , BMI Body mass index is 22.74 kg/(m^2). GEN: Well nourished, elderly, in no acute distress HEENT: normal Neck: no JVD, carotid bruits, or masses Cardiac: IRRR; normal S1-2. Gr 2/6 systolic murmur at the apex. No edema. Respiratory:  clear to auscultation bilaterally, normal work of breathing GI: soft, nontender, nondistended, + BS MS: no deformity or atrophy Skin: warm and dry, no rash Neuro:  Strength and sensation are intact Psych: euthymic mood, full affect   EKG:  EKG is not ordered today. The ekg ordered today demonstrates N/A   Recent Labs: 06/01/2015: B Natriuretic Peptide 286.8* 06/07/2015: Hemoglobin 9.6*; Platelets 406* 06/08/2015: BUN 14; Creatinine, Ser 0.76; Potassium 4.0; Sodium 132*    Lipid Panel    Component Value Date/Time   CHOL 161 06/08/2015 2322   TRIG 76 06/08/2015 2322   HDL 53 06/08/2015 2322   CHOLHDL 3.0 06/08/2015 2322   VLDL 15 06/08/2015 2322   LDLCALC 93 06/08/2015 2322      Wt Readings from Last 3 Encounters:  09/05/15 52.816 kg (116 lb 7 oz)  06/10/15 54.658 kg (120 lb 8 oz)      Other studies Reviewed: Additional studies/ records that were reviewed today include: Hospital records. Review of the above records demonstrates:  Echo:Study Conclusions  - Left ventricle: The cavity size was normal. Systolic function was normal.  The estimated ejection fraction was in the range of 60% to 65%. Wall motion was normal; there were no regional wall motion abnormalities. - Aortic valve: There was trivial regurgitation. - Mitral valve: Calcified annulus. There was severe regurgitation. - Left atrium: The atrium was moderately to severely dilated. - Right atrium: The atrium was moderately to severely dilated. - Tricuspid valve: There was severe regurgitation. - Pulmonary arteries: Systolic pressure was moderately to severely increased. - Pericardium, extracardiac: There was a right pleural effusion. There was a left pleural effusion.   ASSESSMENT AND PLAN:  1.  Atrial fibrillation with controlled ventricular response. On Xarelto for > 2 months without interruption. On amiodarone. I think since she is still symptomatic we should attempt elective DCCV. Given atrial enlargement this may not work but I think it is worth a try. If she is not able to maintain NSR on amiodarone then I would stop amiodarone and treat with a rate control strategy. If she is able to maintain NSR we can see how much her symptoms improve. Will arrange DCCV next week. The procedure and risks were reviewed including but not limited to death, myocardial infarction, stroke, arrythmias. The patient voices understanding and is agreeable to proceed. Will check chemistries and TSH on amiodarone.   2. Chronic diastolic CHF. Exacerbated by Afib. On appropriate diuretic therapy and appears euvolemic. Will follow up chemistries.   3. Hyponatremia. Follow up lab work.  4. Moderate to severe MR and TR. I tried to review studies today but I was unable to load on server. I suspect worsening MR/TR related to her acutely decompensated state. Will treat medically. She is not a candidate for valve surgery.   5. Iron deficiency anemia.  6. HTN   Current medicines are reviewed at length with the patient today.  The patient does not have concerns regarding  medicines.  The following changes have been made:  no change  Labs/ tests ordered today include:  Orders Placed This Encounter  Procedures  . Comprehensive Metabolic Panel (CMET)  . CBC w/Diff/Platelet  . INR/PT  . TSH   DCCV scheduled.   Disposition:   FU with Dr. Swaziland in 4 weeks  Signed, Peter Swaziland, MD  09/05/2015 5:12 PM    [facility] [address] [place], [place], [address] Phone [phone], Fax [phone]

## 2015-09-05 NOTE — Patient Instructions (Signed)
We will check lab work today  We will schedule you for a cardioversion.

## 2015-09-09 LAB — CBC WITH DIFFERENTIAL/PLATELET
Basophils Absolute: 0 10*3/uL (ref 0.0–0.1)
Basophils Relative: 0 % (ref 0–1)
Eosinophils Absolute: 0.1 10*3/uL (ref 0.0–0.7)
Eosinophils Relative: 1 % (ref 0–5)
HCT: 39.3 % (ref 36.0–46.0)
Hemoglobin: 13.2 g/dL (ref 12.0–15.0)
Lymphocytes Relative: 15 % (ref 12–46)
Lymphs Abs: 1 10*3/uL (ref 0.7–4.0)
MCH: 30.3 pg (ref 26.0–34.0)
MCHC: 33.6 g/dL (ref 30.0–36.0)
MCV: 90.1 fL (ref 78.0–100.0)
MPV: 10.9 fL (ref 8.6–12.4)
Monocytes Absolute: 0.6 10*3/uL (ref 0.1–1.0)
Monocytes Relative: 9 % (ref 3–12)
Neutro Abs: 5.1 10*3/uL (ref 1.7–7.7)
Neutrophils Relative %: 75 % (ref 43–77)
Platelets: 239 10*3/uL (ref 150–400)
RBC: 4.36 MIL/uL (ref 3.87–5.11)
RDW: 15.8 % — ABNORMAL HIGH (ref 11.5–15.5)
WBC: 6.8 10*3/uL (ref 4.0–10.5)

## 2015-09-10 LAB — COMPREHENSIVE METABOLIC PANEL
ALT: 20 U/L (ref 6–29)
AST: 21 U/L (ref 10–35)
Albumin: 4.1 g/dL (ref 3.6–5.1)
Alkaline Phosphatase: 67 U/L (ref 33–130)
BUN: 18 mg/dL (ref 7–25)
CO2: 26 mmol/L (ref 20–31)
Calcium: 8.9 mg/dL (ref 8.6–10.4)
Chloride: 99 mmol/L (ref 98–110)
Creat: 0.89 mg/dL — ABNORMAL HIGH (ref 0.60–0.88)
Glucose, Bld: 61 mg/dL — ABNORMAL LOW (ref 65–99)
Potassium: 4.1 mmol/L (ref 3.5–5.3)
Sodium: 135 mmol/L (ref 135–146)
Total Bilirubin: 0.4 mg/dL (ref 0.2–1.2)
Total Protein: 6.3 g/dL (ref 6.1–8.1)

## 2015-09-10 LAB — PROTIME-INR
INR: 1.42 (ref <1.50)
Prothrombin Time: 17.5 seconds — ABNORMAL HIGH (ref 11.6–15.2)

## 2015-09-10 LAB — TSH: TSH: 2.54 mIU/L

## 2015-09-15 ENCOUNTER — Ambulatory Visit (HOSPITAL_COMMUNITY): Payer: Medicare Other | Admitting: Certified Registered Nurse Anesthetist

## 2015-09-15 ENCOUNTER — Encounter (HOSPITAL_COMMUNITY): Payer: Self-pay | Admitting: *Deleted

## 2015-09-15 ENCOUNTER — Encounter (HOSPITAL_COMMUNITY)
Admission: RE | Disposition: A | Discharge status: Home / Self Care | Payer: Self-pay | Source: Ambulatory Visit | Attending: Cardiovascular Disease

## 2015-09-15 ENCOUNTER — Ambulatory Visit (HOSPITAL_COMMUNITY)
Admission: RE | Admit: 2015-09-15 | Discharge: 2015-09-15 | Disposition: A | Discharge status: Home / Self Care | Payer: Medicare Other | Source: Ambulatory Visit | Attending: Cardiovascular Disease | Admitting: Cardiovascular Disease

## 2015-09-15 DIAGNOSIS — Z79899 Other long term (current) drug therapy: Secondary | ICD-10-CM | POA: Insufficient documentation

## 2015-09-15 DIAGNOSIS — I4891 Unspecified atrial fibrillation: Secondary | ICD-10-CM | POA: Insufficient documentation

## 2015-09-15 DIAGNOSIS — I110 Hypertensive heart disease with heart failure: Secondary | ICD-10-CM | POA: Insufficient documentation

## 2015-09-15 DIAGNOSIS — D509 Iron deficiency anemia, unspecified: Secondary | ICD-10-CM | POA: Diagnosis not present

## 2015-09-15 DIAGNOSIS — Z8673 Personal history of transient ischemic attack (TIA), and cerebral infarction without residual deficits: Secondary | ICD-10-CM | POA: Insufficient documentation

## 2015-09-15 DIAGNOSIS — E871 Hypo-osmolality and hyponatremia: Secondary | ICD-10-CM | POA: Diagnosis not present

## 2015-09-15 DIAGNOSIS — Z7901 Long term (current) use of anticoagulants: Secondary | ICD-10-CM | POA: Insufficient documentation

## 2015-09-15 DIAGNOSIS — I5032 Chronic diastolic (congestive) heart failure: Secondary | ICD-10-CM | POA: Insufficient documentation

## 2015-09-15 HISTORY — PX: CARDIOVERSION: SHX1299

## 2015-09-15 SURGERY — CARDIOVERSION
Anesthesia: General

## 2015-09-15 MED ORDER — PROPOFOL 10 MG/ML IV BOLUS
INTRAVENOUS | Status: DC | PRN
  Administered 2015-09-15: 60 mg via INTRAVENOUS

## 2015-09-15 MED ORDER — LIDOCAINE HCL (CARDIAC) 20 MG/ML IV SOLN
INTRAVENOUS | Status: DC | PRN
  Administered 2015-09-15: 60 mg via INTRATRACHEAL

## 2015-09-15 MED ORDER — SODIUM CHLORIDE 0.9 % IV SOLN
INTRAVENOUS | Status: DC | PRN
  Administered 2015-09-15: 13:00:00 via INTRAVENOUS

## 2015-09-15 NOTE — H&P (View-Only) (Signed)
Cardiology Office Note   Date:  09/05/2015   ID:  [name], Jones Aug 17, 1923, MRN [id number]  PCP:  Lorenda Peck, MD  Cardiologist:   Denaly Gatling Swaziland, MD   Chief Complaint  Patient presents with  . New Patient (Initial Visit)      no chest pain, shortness of breath-with exertion, occassional edema, no pain or cramping in legs, occassional lightheadedness or dizziness      History of Present Illness: [name] Jones is a 82 y.o. female who presents for evaluation of CHF and atrial fibrillation at the request of Dr. Su Hilt. I care for her husband [name] Jones. She has a history of HTN. In early November she was noted to have new onset atrial fibrillation. She was started on Xarelto. She deteriorated and was admitted with acute diastolic CHF. She was diuresed with IV lasix. She had hyponatremia. She had an Echo in May 2016 while in NSR showing Normal LV function with grade 2 diastolic dysfunction. There was moderate MR and mild to moderate TR. Mild pulmonary HTN. Repeat Echo in hospital showed normal LV function with severe MR and TR and mod to severe biatrial enlargement.  Since DC her breathing is better but she still has DOE and is "slow". Not as much energy. No chest pain. Mild ankle swelling. Weight at home has been stable. No chest pain or dizziness. Seen with daughter today.     Past Medical History  Diagnosis Date  . Hypertension   . Hyponatremia   . Chronic diastolic CHF (congestive heart failure) (HCC)   . Atrial fibrillation (HCC)   . Glaucoma   . Iron deficiency anemia   . Mitral insufficiency   . Tricuspid insufficiency   . TIA (transient ischemic attack) 1997    Past Surgical History  Procedure Laterality Date  . Abdominal hysterectomy    . Tonsillectomy    . Diagnostic laparoscopy    . Cataract extraction    . Strabismus surgery       Current Outpatient Prescriptions  Medication Sig Dispense Refill  . amiodarone (PACERONE) 200 MG tablet Take 1  tablet (200 mg total) by mouth daily. 30 tablet 3  . calcium carbonate (OS-CAL - DOSED IN MG OF ELEMENTAL CALCIUM) 1250 (500 CA) MG tablet Take 1 tablet by mouth.    . carteolol (OCUPRESS) 1 % ophthalmic solution Place 1 drop into both eyes 2 (two) times daily.    . cholecalciferol (VITAMIN D) 1000 UNITS tablet Take 1,000 Units by mouth daily.    . CVS TUSSIN DM 100-10 MG/5ML liquid As needed TAKE (1TEASPOONFUL) BY MOUTH EVERY 4 (FOUR) HOURS AS NEEDED for cough  0  . diltiazem (CARDIZEM CD) 240 MG 24 hr capsule Take 1 capsule (240 mg total) by mouth daily. 30 capsule 3  . ferrous sulfate 325 (65 FE) MG tablet Take 1 tablet (325 mg total) by mouth daily with breakfast. 30 tablet 3  . furosemide (LASIX) 40 MG tablet Take 1 tablet (40 mg total) by mouth 2 (two) times daily. 60 tablet 3  . guaiFENesin-dextromethorphan (ROBITUSSIN DM) 100-10 MG/5ML syrup Take 5 mLs by mouth every 4 (four) hours as needed for cough. 118 mL 0  . latanoprost (XALATAN) 0.005 % ophthalmic solution Place 1 drop into both eyes at bedtime.    Marland Kitchen losartan (COZAAR) 50 MG tablet Take 1 tablet by mouth daily. Take 1 tab by mouth daily    . metoprolol succinate (TOPROL-XL) 50 MG 24 hr tablet Take  50 mg by mouth daily. Take with or immediately following a meal.    . Multiple Vitamin (MULTIVITAMIN WITH MINERALS) TABS tablet Take 1 tablet by mouth daily.    . mupirocin ointment (BACTROBAN) 2 % APPLY ON SKIN EVERY DAY  1  . potassium chloride SA (K-DUR,KLOR-CON) 10 MEQ tablet Take 1 tablet (10 mEq total) by mouth 2 (two) times daily. 60 tablet 3  . rivaroxaban (XARELTO) 20 MG TABS tablet Take 20 mg by mouth daily with supper.    . vitamin C (ASCORBIC ACID) 500 MG tablet Take 500 mg by mouth daily.     No current facility-administered medications for this visit.    Allergies:   Lisinopril    Social History:  The patient  reports that she has never smoked. She does not have any smokeless tobacco history on file. She reports that  she drinks alcohol. She reports that she does not use illicit drugs.   Family History:  The patient's family history includes Heart attack in her brother; Heart attack (age of onset: 22) in her son; Heart attack (age of onset: 41) in her father; Heart disease in her mother; Hypertension in her son; Ovarian cancer (age of onset: 43) in her mother.    ROS:  Please see the history of present illness.   Otherwise, review of systems are positive for none.   All other systems are reviewed and negative.    PHYSICAL EXAM: VS:  BP 152/74 mmHg  Pulse 80  Ht 5' (1.524 m)  Wt 52.816 kg (116 lb 7 oz)  BMI 22.74 kg/m2 , BMI Body mass index is 22.74 kg/(m^2). GEN: Well nourished, elderly, in no acute distress HEENT: normal Neck: no JVD, carotid bruits, or masses Cardiac: IRRR; normal S1-2. Gr 2/6 systolic murmur at the apex. No edema. Respiratory:  clear to auscultation bilaterally, normal work of breathing GI: soft, nontender, nondistended, + BS MS: no deformity or atrophy Skin: warm and dry, no rash Neuro:  Strength and sensation are intact Psych: euthymic mood, full affect   EKG:  EKG is not ordered today. The ekg ordered today demonstrates N/A   Recent Labs: 06/01/2015: B Natriuretic Peptide 286.8* 06/07/2015: Hemoglobin 9.6*; Platelets 406* 06/08/2015: BUN 14; Creatinine, Ser 0.76; Potassium 4.0; Sodium 132*    Lipid Panel    Component Value Date/Time   CHOL 161 06/08/2015 2322   TRIG 76 06/08/2015 2322   HDL 53 06/08/2015 2322   CHOLHDL 3.0 06/08/2015 2322   VLDL 15 06/08/2015 2322   LDLCALC 93 06/08/2015 2322      Wt Readings from Last 3 Encounters:  09/05/15 52.816 kg (116 lb 7 oz)  06/10/15 54.658 kg (120 lb 8 oz)      Other studies Reviewed: Additional studies/ records that were reviewed today include: Hospital records. Review of the above records demonstrates:  Echo:Study Conclusions  - Left ventricle: The cavity size was normal. Systolic function was normal.  The estimated ejection fraction was in the range of 60% to 65%. Wall motion was normal; there were no regional wall motion abnormalities. - Aortic valve: There was trivial regurgitation. - Mitral valve: Calcified annulus. There was severe regurgitation. - Left atrium: The atrium was moderately to severely dilated. - Right atrium: The atrium was moderately to severely dilated. - Tricuspid valve: There was severe regurgitation. - Pulmonary arteries: Systolic pressure was moderately to severely increased. - Pericardium, extracardiac: There was a right pleural effusion. There was a left pleural effusion.   ASSESSMENT AND PLAN:  1.  Atrial fibrillation with controlled ventricular response. On Xarelto for > 2 months without interruption. On amiodarone. I think since she is still symptomatic we should attempt elective DCCV. Given atrial enlargement this may not work but I think it is worth a try. If she is not able to maintain NSR on amiodarone then I would stop amiodarone and treat with a rate control strategy. If she is able to maintain NSR we can see how much her symptoms improve. Will arrange DCCV next week. The procedure and risks were reviewed including but not limited to death, myocardial infarction, stroke, arrythmias. The patient voices understanding and is agreeable to proceed. Will check chemistries and TSH on amiodarone.   2. Chronic diastolic CHF. Exacerbated by Afib. On appropriate diuretic therapy and appears euvolemic. Will follow up chemistries.   3. Hyponatremia. Follow up lab work.  4. Moderate to severe MR and TR. I tried to review studies today but I was unable to load on server. I suspect worsening MR/TR related to her acutely decompensated state. Will treat medically. She is not a candidate for valve surgery.   5. Iron deficiency anemia.  6. HTN   Current medicines are reviewed at length with the patient today.  The patient does not have concerns regarding  medicines.  The following changes have been made:  no change  Labs/ tests ordered today include:  Orders Placed This Encounter  Procedures  . Comprehensive Metabolic Panel (CMET)  . CBC w/Diff/Platelet  . INR/PT  . TSH   DCCV scheduled.   Disposition:   FU with Dr. Swaziland in 4 weeks  Signed, Meet Weathington Swaziland, MD  09/05/2015 5:12 PM    [facility] [address] [place], [place], [address] Phone [phone], Fax [phone]

## 2015-09-15 NOTE — Anesthesia Preprocedure Evaluation (Signed)
Anesthesia Evaluation  Patient identified by MRN, date of birth, ID band Patient awake    Reviewed: Allergy & Precautions, NPO status , Patient's Chart, lab work & pertinent test results  Airway Mallampati: II  TM Distance: >3 FB Neck ROM: Full    Dental no notable dental hx.    Pulmonary neg pulmonary ROS,    Pulmonary exam normal breath sounds clear to auscultation       Cardiovascular hypertension, +CHF  + dysrhythmias Atrial Fibrillation  Rhythm:Irregular Rate:Normal  Left ventricle: The cavity size was normal. Systolic function was normal. The estimated ejection fraction was in the range of 60% to 65%. Wall motion was normal; there were no regional wall motion abnormalities. - Aortic valve: There was trivial regurgitation. - Mitral valve: Calcified annulus. There was severe regurgitation. - Left atrium: The atrium was moderately to severely dilated. - Right atrium: The atrium was moderately to severely dilated. - Tricuspid valve: There was severe regurgitation. - Pulmonary arteries: Systolic pressure was moderately to severely increased. - Pericardium, extracardiac: There was a right pleural effusion. There was a left pleural effusion.    Neuro/Psych TIAnegative psych ROS   GI/Hepatic negative GI ROS, Neg liver ROS,   Endo/Other  negative endocrine ROS  Renal/GU negative Renal ROS  negative genitourinary   Musculoskeletal negative musculoskeletal ROS (+)   Abdominal   Peds negative pediatric ROS (+)  Hematology negative hematology ROS (+)   Anesthesia Other Findings   Reproductive/Obstetrics negative OB ROS                             Anesthesia Physical Anesthesia Plan  ASA: III  Anesthesia Plan: General   Post-op Pain Management:    Induction: Intravenous  Airway Management Planned: Mask  Additional Equipment:   Intra-op Plan:   Post-operative Plan:   Informed  Consent: I have reviewed the patients History and Physical, chart, labs and discussed the procedure including the risks, benefits and alternatives for the proposed anesthesia with the patient or authorized representative who has indicated his/her understanding and acceptance.   Dental advisory given  Plan Discussed with: CRNA and Surgeon  Anesthesia Plan Comments:         Anesthesia Quick Evaluation

## 2015-09-15 NOTE — Transfer of Care (Signed)
Immediate Anesthesia Transfer of Care Note  Patient: [name] Jones  Procedure(s) Performed: Procedure(s): CARDIOVERSION (N/A)  Patient Location: PACU  Anesthesia Type:MAC  Level of Consciousness: awake and alert   Airway & Oxygen Therapy: Patient Spontanous Breathing  Post-op Assessment: Report given to RN and Post -op Vital signs reviewed and stable  Post vital signs: stable  Last Vitals:  Filed Vitals:   09/15/15 1145 09/15/15 1314  BP: 171/89 126/52  Pulse: 93 66  Temp: 37 C   Resp: 19 19    Complications: No apparent anesthesia complications

## 2015-09-15 NOTE — Discharge Instructions (Signed)
Electrical Cardioversion, Care After °Refer to this sheet in the next few weeks. These instructions provide you with information on caring for yourself after your procedure. Your health care provider may also give you more specific instructions. Your treatment has been planned according to current medical practices, but problems sometimes occur. Call your health care provider if you have any problems or questions after your procedure. °WHAT TO EXPECT AFTER THE PROCEDURE °After your procedure, it is typical to have the following sensations: °· Some redness on the skin where the shocks were delivered. If this is tender, a sunburn lotion or hydrocortisone cream may help. °· Possible return of an abnormal heart rhythm within hours or days after the procedure. °HOME CARE INSTRUCTIONS °· Take medicines only as directed by your health care provider. Be sure you understand how and when to take your medicine. °· Learn how to feel your pulse and check it often. °· Limit your activity for 48 hours after the procedure or as directed by your health care provider. °· Avoid or minimize caffeine and other stimulants as directed by your health care provider. °SEEK MEDICAL CARE IF: °· You feel like your heart is beating too fast or your pulse is not regular. °· You have any questions about your medicines. °· You have bleeding that will not stop. °SEEK IMMEDIATE MEDICAL CARE IF: °· You are dizzy or feel faint. °· It is hard to breathe or you feel short of breath. °· There is a change in discomfort in your chest. °· Your speech is slurred or you have trouble moving an arm or leg on one side of your body. °· You get a serious muscle cramp that does not go away. °· Your fingers or toes turn cold or blue. °  °This information is not intended to replace advice given to you by your health care provider. Make sure you discuss any questions you have with your health care provider. °  °Document Released: 04/29/2013 Document Revised: 07/30/2014  Document Reviewed: 04/29/2013 °Elsevier Interactive Patient Education ©2016 [employer] ° °

## 2015-09-15 NOTE — Anesthesia Postprocedure Evaluation (Signed)
Anesthesia Post Note  Patient: [name] Jones  Procedure(s) Performed: Procedure(s) (LRB): CARDIOVERSION (N/A)  Patient location during evaluation: PACU Anesthesia Type: General Level of consciousness: awake and alert Pain management: pain level controlled Vital Signs Assessment: post-procedure vital signs reviewed and stable Respiratory status: spontaneous breathing, nonlabored ventilation, respiratory function stable and patient connected to nasal cannula oxygen Cardiovascular status: blood pressure returned to baseline and stable Postop Assessment: no signs of nausea or vomiting Anesthetic complications: no    Last Vitals:  Filed Vitals:   09/15/15 1320 09/15/15 1330  BP: 135/76 117/91  Pulse: 66 68  Temp:    Resp: 20 18    Last Pain: There were no vitals filed for this visit.               Ladarrion Telfair S

## 2015-09-15 NOTE — CV Procedure (Signed)
DCC: Anesthesia Dr Okey Dupre 60 mg propofol 60 mg lidocaine  DCC x1  200 J Afib rate115 converted to NSR rate 70  No immediate neurologic sequelae.   On Rx NOAC  [name] Jones

## 2015-09-15 NOTE — Interval H&P Note (Signed)
History and Physical Interval Note:  09/15/2015 12:58 PM  [name] Jones  has presented today for surgery, with the diagnosis of AFIB  The various methods of treatment have been discussed with the patient and family. After consideration of risks, benefits and other options for treatment, the patient has consented to  Procedure(s): CARDIOVERSION (N/A) as a surgical intervention .  The patient's history has been reviewed, patient examined, no change in status, stable for surgery.  I have reviewed the patient's chart and labs.  Questions were answered to the patient's satisfaction.     Charlton Haws

## 2015-09-16 ENCOUNTER — Encounter (HOSPITAL_COMMUNITY): Payer: Self-pay | Admitting: Cardiovascular Disease

## 2015-09-20 ENCOUNTER — Telehealth: Payer: Self-pay | Admitting: Cardiology

## 2015-09-20 NOTE — Telephone Encounter (Signed)
Called pt back, added for available extender slot. Tentatively scheduled w Wilburt Finlay tomorrow at 2:30pm. Pt will call back once she can verify transportation.

## 2015-09-20 NOTE — Telephone Encounter (Signed)
Pt of Dr. Swaziland.  Notes fatigue for a few days, HR when checked is 46-51. She had DCCV 09/15/15  her BP has also been up and down a bit, between 150-170 systolic.  Relevant meds: Amiodarone  taken DAILY NOON Diltiazem  taken DAILY AM toprol XL  taken DAILY AM  She also takes Ocupress (carteolol) eyedrops, which is a longstanding medication.  No recent medication changes.  Concerned mainly for the rate and increased fatigue since Sunday. She is not short of breath, dizzy or lightheaded. Pt aware I will defer for advice. Routed to [employer].

## 2015-09-20 NOTE — Telephone Encounter (Signed)
Pt had a cardioversion on Thursday. She is concerned, her heart rate is 48 today,yesterday it was 51. She said her blood pressure this morning was 113/59.Please call to advise.

## 2015-09-21 ENCOUNTER — Ambulatory Visit (INDEPENDENT_AMBULATORY_CARE_PROVIDER_SITE_OTHER): Payer: Medicare Other | Admitting: Physician Assistant

## 2015-09-21 ENCOUNTER — Ambulatory Visit (INDEPENDENT_AMBULATORY_CARE_PROVIDER_SITE_OTHER): Payer: Medicare Other

## 2015-09-21 ENCOUNTER — Encounter: Payer: Self-pay | Admitting: Physician Assistant

## 2015-09-21 VITALS — BP 110/42 | HR 49 | Ht 60.0 in | Wt 117.0 lb

## 2015-09-21 DIAGNOSIS — R001 Bradycardia, unspecified: Secondary | ICD-10-CM | POA: Insufficient documentation

## 2015-09-21 DIAGNOSIS — I5032 Chronic diastolic (congestive) heart failure: Secondary | ICD-10-CM

## 2015-09-21 DIAGNOSIS — I10 Essential (primary) hypertension: Secondary | ICD-10-CM

## 2015-09-21 DIAGNOSIS — I480 Paroxysmal atrial fibrillation: Secondary | ICD-10-CM

## 2015-09-21 NOTE — Progress Notes (Signed)
Patient ID: [name] Jones, female   DOB: [birth year], 82 y.o.   MRN: [id number]    Date:  09/21/2015   ID:  [name], Jones [name] Jones, MRN [id number]  PCP:  Lorenda Peck, MD  Primary Cardiologist:  [place]   Chief Complaint  Patient presents with  . Follow-up    no chest pain, some shortness of breath, no swelling, some lightheadedness, no cramping'     History of Present Illness: [name] Jones is a 82 y.o. female  Was seen on 09/05/2015 for evaluation of heart failure and atrial fibrillation. History also includes hypertension. She was started on Xarelto back in November when her atrial fibrillation was first identified. At that time she was diuresed with IV Lasix. She had some hyponatremia. She had an echocardiogram  06/01/2015 revealing ejection fraction of 60-65% , trivial aortic valve regurgitation. Severe MR. Left atrium was moderate severely dilated. Right atrium was moderate to severely dilated. Severe tricuspid regurgitation. Pulmonary arterial pressure was severely elevated.  She is on amiodarone and on thyroid 23rd she underwent DC CV to sinus rhythm   [name] Jones presents for evaluation of fatigue.   For the last 3 days she reports feeling anxious , short of breath T. Shortness of breath is mostly with exertion and resolves when she rests. Her blood pressures been up and down ranging from the low 100s to 191/92.  Sunday she became a little bit dizzy when walking to the bedroom.   She has a strange feeling in her chest that she could not quite characterize. She can tell tat her heart rate is slow  And feels that it is slower than it used to be. She watches her weight daily and it fluctuates only about 2 or 3 pounds. The patient currently denies nausea, vomiting, fever, chest pain,  orthopnea, PND, cough, congestion, abdominal pain, hematochezia, melena, lower extremity edema, claudication.  Wt Readings from Last 3 Encounters:  09/21/15 117 lb (53.071 kg)  09/15/15  113 lb (51.256 kg)  09/05/15 116 lb 7 oz (52.816 kg)     Past Medical History  Diagnosis Date  . Hypertension   . Hyponatremia   . Chronic diastolic CHF (congestive heart failure) (HCC)   . Atrial fibrillation (HCC)   . Glaucoma   . Iron deficiency anemia   . Mitral insufficiency   . Tricuspid insufficiency   . TIA (transient ischemic attack) 1997    Current Outpatient Prescriptions  Medication Sig Dispense Refill  . amiodarone (PACERONE) 200 MG tablet Take 1 tablet (200 mg total) by mouth daily. 30 tablet 3  . calcium carbonate (OS-CAL - DOSED IN MG OF ELEMENTAL CALCIUM) 1250 (500 CA) MG tablet Take 1 tablet by mouth.    . carteolol (OCUPRESS) 1 % ophthalmic solution Place 1 drop into both eyes 2 (two) times daily.    . CVS TUSSIN DM 100-10 MG/5ML liquid As needed TAKE (1TEASPOONFUL) BY MOUTH EVERY 4 (FOUR) HOURS AS NEEDED for cough  0  . diltiazem (CARDIZEM CD) 240 MG 24 hr capsule Take 1 capsule (240 mg total) by mouth daily. 30 capsule 3  . ferrous sulfate 325 (65 FE) MG tablet Take 1 tablet (325 mg total) by mouth daily with breakfast. 30 tablet 3  . furosemide (LASIX) 40 MG tablet Take 1 tablet (40 mg total) by mouth 2 (two) times daily. 60 tablet 3  . latanoprost (XALATAN) 0.005 % ophthalmic solution Place 1 drop into both eyes at bedtime.    Marland Kitchen  losartan (COZAAR) 50 MG tablet Take 1 tablet by mouth daily. Take 1 tab by mouth daily    . metoprolol succinate (TOPROL-XL) 50 MG 24 hr tablet Take 25 mg by mouth daily.    . Multiple Vitamin (MULTIVITAMIN WITH MINERALS) TABS tablet Take 1 tablet by mouth daily.    . mupirocin ointment (BACTROBAN) 2 % APPLY ON SKIN EVERY DAY  1  . potassium chloride SA (K-DUR,KLOR-CON) 10 MEQ tablet Take 1 tablet (10 mEq total) by mouth 2 (two) times daily. 60 tablet 3  . rivaroxaban (XARELTO) 20 MG TABS tablet Take 20 mg by mouth daily with supper.    . vitamin C (ASCORBIC ACID) 500 MG tablet Take 500 mg by mouth daily.     No current  facility-administered medications for this visit.    Allergies:    Allergies  Allergen Reactions  . Lisinopril Cough    Social History:  The patient  reports that she has never smoked. She does not have any smokeless tobacco history on file. She reports that she drinks alcohol. She reports that she does not use illicit drugs.   Family history:   Family History  Problem Relation Age of Onset  . Heart disease Mother   . Ovarian cancer Mother 61  . Heart attack Father 20  . Heart attack Brother   . Heart attack Son 52  . Hypertension Son     ROS:  Please see the history of present illness.  All other systems reviewed and negative.   PHYSICAL EXAM: VS:  BP 110/42 mmHg  Pulse 49  Ht 5' (1.524 m)  Wt 117 lb (53.071 kg)  BMI 22.85 kg/m2 Well nourished, well developed, in no acute distress HEENT: Pupils are equal round react to light accommodation extraocular movements are intact.  Neck: no JVDNo cervical lymphadenopathy. Cardiac: Regular rate and rhythm without murmurs rubs or gallops  However, she has intermittent irregularity. Lungs:  clear to auscultation bilaterally, no wheezing, rhonchi or rales Abd: soft, nontender, positive bowel sounds all quadrants, no hepatosplenomegaly Ext: no lower extremity edema.  2+ radial and dorsalis pedis pulses. Skin: warm and dry Neuro:  Grossly normal  EKG:   Sinus bradycardia rate 49 bpm  ASSESSMENT AND PLAN:  Problem List Items Addressed This Visit    HTN (hypertension)   Relevant Orders   EKG 12-Lead   Chronic diastolic CHF (congestive heart failure) (HCC)   Relevant Orders   EKG 12-Lead   Bradycardia by electrocardiogram - Primary   Atrial fibrillation (HCC)   Relevant Orders   EKG 12-Lead    Other Visit Diagnoses    Bradycardia        Relevant Orders    Cardiac event monitor       The patient presents after cardioversion last Thursday. She was asked to be seen because she's been feeling fatigued, anxious, a little  dizzy and short of breath with exertion. She is still in this sinus rhythm by EKG however, her heart rate is 49.   On exam her heart rate seems to be a little bit irregular  And slow. I decreased her metoprolol 25 mg. Also put her on a CardioNet monitor for a week. Blood pressure is well-controlled right now she will continue to monitor. She is a follow-up appointment on the 24th with Rhonda Barrett.  She does appear euvolemic at this time. She is also on Cardizem 240 and amiodarone 200 mg daily. Immediately consider further titration down of Cardizem and metoprolol.

## 2015-09-21 NOTE — Patient Instructions (Addendum)
Your physician has recommended you make the following change in your medication: decrease the metoprolol succ to 25 mg daily. ( 1/2 tablet)  Your physician has recommended that you wear an event monitor. Event monitors are medical devices that record the heart's electrical activity. Doctors most often [place] these monitors to diagnose arrhythmias. Arrhythmias are problems with the speed or rhythm of the heartbeat. The monitor is a small, portable device. You can wear one while you do your normal daily activities. This is usually used to diagnose what is causing palpitations/syncope (passing out). This will be placed on at [address] TODAY and be worn for 7 days.   Keep appointment scheduled with Theodore Demark.

## 2015-09-26 ENCOUNTER — Other Ambulatory Visit: Payer: Self-pay

## 2015-09-26 MED ORDER — FUROSEMIDE 40 MG PO TABS: 40.0000 mg | ORAL_TABLET | Freq: Two times a day (BID) | ORAL | Status: DC

## 2015-09-26 MED ORDER — DILTIAZEM HCL ER COATED BEADS 240 MG PO CP24: 240.0000 mg | ORAL_CAPSULE | Freq: Every day | ORAL | Status: DC

## 2015-09-26 MED ORDER — AMIODARONE HCL 200 MG PO TABS: 200.0000 mg | ORAL_TABLET | Freq: Every day | ORAL | Status: DC

## 2015-09-26 NOTE — Telephone Encounter (Signed)
Rx(s) sent to pharmacy electronically.  

## 2015-09-29 ENCOUNTER — Ambulatory Visit: Payer: Medicare Other | Admitting: Cardiology

## 2015-10-03 ENCOUNTER — Other Ambulatory Visit: Payer: Self-pay | Admitting: Cardiology

## 2015-10-03 NOTE — Telephone Encounter (Signed)
REFILL 

## 2015-10-14 ENCOUNTER — Encounter: Payer: Self-pay | Admitting: Physician Assistant

## 2015-10-14 ENCOUNTER — Ambulatory Visit (INDEPENDENT_AMBULATORY_CARE_PROVIDER_SITE_OTHER): Payer: Medicare Other | Admitting: Physician Assistant

## 2015-10-14 VITALS — BP 144/62 | HR 51 | Ht 60.0 in | Wt 118.7 lb

## 2015-10-14 DIAGNOSIS — I480 Paroxysmal atrial fibrillation: Secondary | ICD-10-CM

## 2015-10-14 DIAGNOSIS — Z7901 Long term (current) use of anticoagulants: Secondary | ICD-10-CM

## 2015-10-14 MED ORDER — DILTIAZEM HCL ER COATED BEADS 180 MG PO CP24: 180.0000 mg | ORAL_CAPSULE | Freq: Every day | ORAL | Status: DC

## 2015-10-14 NOTE — Patient Instructions (Signed)
Your physician has recommended you make the following change in your medication: your diltiazem has been decreased to 180 mg daily and a new prescription has been sent to your pharmacy.  Rhonda requests that you wait 2 weeks before having any procedures done.  Your physician recommends that you schedule a follow-up appointment in: 2 months with Dr [place].

## 2015-10-14 NOTE — Progress Notes (Signed)
Cardiology Office Note   Date:  10/14/2015   ID:  [name], Jones [birth year], MRN [id number]  PCP:  Lorenda Peck, MD  Cardiologist:  Dr [place]  Barrett, Rhonda, PA-C   No chief complaint on file.   History of Present Illness: [name] Jones is a 82 y.o. female with a history of D-CHF, HTN, afib on Xarelto s/p DCCV, low Na+, anemia, TIA.  [name] Jones presents for follow-up of her previous office visit where she was significantly bradycardic. Her metoprolol was decreased and an event monitor was ordered. She is here today in follow-up.  The event monitor was reviewed. It was sinus rhythm and sinus bradycardia with a heart rate as low as 42. There was no atrial fibrillation or other arrhythmia.  Since her metoprolol was decreased, she has been following her blood pressure and heart rate more carefully. Her blood pressure has been fine, but her heart rate has been low. She has gotten frequent readings in the 40s although it is not as low as it was prior to the decrease in the beta blocker. She is still complaining of fatigue. She feels that she gets very tired easily and doesn't feel like doing much.  Additionally, she wonders if she can drive. She also needs to have her teeth cleaned and wonders if she can stop the Xarelto for this.   Past Medical History  Diagnosis Date  . Hypertension   . Hyponatremia   . Chronic diastolic CHF (congestive heart failure) (HCC)   . Atrial fibrillation (HCC)   . Glaucoma   . Iron deficiency anemia   . Mitral insufficiency   . Tricuspid insufficiency   . TIA (transient ischemic attack) 1997    Past Surgical History  Procedure Laterality Date  . Abdominal hysterectomy    . Tonsillectomy    . Diagnostic laparoscopy    . Cataract extraction    . Strabismus surgery    . Cardioversion N/A 09/15/2015    Procedure: CARDIOVERSION;  Surgeon: Wendall Stade, MD;  Location: [facility] END[facility];  Service: Cardiovascular;  Laterality:  N/A;    Current Outpatient Prescriptions  Medication Sig Dispense Refill  . amiodarone (PACERONE) 200 MG tablet Take 1 tablet (200 mg total) by mouth daily. 30 tablet 11  . calcium carbonate (OS-CAL - DOSED IN MG OF ELEMENTAL CALCIUM) 1250 (500 CA) MG tablet Take 1 tablet by mouth.    . carteolol (OCUPRESS) 1 % ophthalmic solution Place 1 drop into both eyes 2 (two) times daily.    . CVS TUSSIN DM 100-10 MG/5ML liquid As needed TAKE (1TEASPOONFUL) BY MOUTH EVERY 4 (FOUR) HOURS AS NEEDED for cough  0  . diltiazem (CARDIZEM CD) 240 MG 24 hr capsule Take 1 capsule (240 mg total) by mouth daily. 30 capsule 11  . ferrous sulfate 325 (65 FE) MG tablet Take 1 tablet (325 mg total) by mouth daily with breakfast. 30 tablet 3  . furosemide (LASIX) 40 MG tablet Take 1 tablet (40 mg total) by mouth 2 (two) times daily. 60 tablet 11  . KLOR-CON M10 10 MEQ tablet TAKE 1 TABLET (10 MEQ TOTAL) BY MOUTH 2 (TWO) TIMES DAILY. 60 tablet 3  . latanoprost (XALATAN) 0.005 % ophthalmic solution Place 1 drop into both eyes at bedtime.    Marland Kitchen losartan (COZAAR) 50 MG tablet Take 1 tablet by mouth daily. Take 1 tab by mouth daily    . metoprolol succinate (TOPROL-XL) 50 MG 24 hr tablet Take  25 mg by mouth daily.    . Multiple Vitamin (MULTIVITAMIN WITH MINERALS) TABS tablet Take 1 tablet by mouth daily.    . mupirocin ointment (BACTROBAN) 2 % APPLY ON SKIN EVERY DAY  1  . rivaroxaban (XARELTO) 20 MG TABS tablet Take 20 mg by mouth daily with supper.    . vitamin C (ASCORBIC ACID) 500 MG tablet Take 500 mg by mouth daily.     No current facility-administered medications for this visit.    Allergies:   Lisinopril    Social History:  The patient  reports that she has never smoked. She has never used smokeless tobacco. She reports that she drinks alcohol. She reports that she does not use illicit drugs.   Family History:  The patient's family history includes Heart attack in her brother; Heart attack (age of onset:  3) in her son; Heart attack (age of onset: 16) in her father; Heart disease in her mother; Hypertension in her son; Ovarian cancer (age of onset: 55) in her mother.    ROS:  Please see the history of present illness. All other systems are reviewed and negative.    PHYSICAL EXAM: VS:  BP 144/62 mmHg  Pulse 51  Ht 5' (1.524 m)  Wt 118 lb 11.2 oz (53.842 kg)  BMI 23.18 kg/m2 , BMI Body mass index is 23.18 kg/(m^2). GEN: Well nourished, well developed, female in no acute distress HEENT: normal for age  Neck: no JVD, no carotid bruit, no masses Cardiac: RRR; no murmur, no rubs, or gallops Respiratory:  clear to auscultation bilaterally, normal work of breathing GI: soft, nontender, nondistended, + BS MS: no deformity or atrophy; no edema; distal pulses are 2+ in all 4 extremities  Skin: warm and dry, no rash Neuro:  Strength and sensation are intact Psych: euthymic mood, full affect   EKG:  EKG is not ordered today.  Recent Labs: 06/01/2015: B Natriuretic Peptide 286.8* 09/09/2015: ALT 20; BUN 18; Creat 0.89*; Hemoglobin 13.2; Platelets 239; Potassium 4.1; Sodium 135; TSH 2.54    Lipid Panel    Component Value Date/Time   CHOL 161 06/08/2015 2322   TRIG 76 06/08/2015 2322   HDL 53 06/08/2015 2322   CHOLHDL 3.0 06/08/2015 2322   VLDL 15 06/08/2015 2322   LDLCALC 93 06/08/2015 2322     Wt Readings from Last 3 Encounters:  10/14/15 118 lb 11.2 oz (53.842 kg)  09/21/15 117 lb (53.071 kg)  09/15/15 113 lb (51.256 kg)     Other studies Reviewed: Additional studies/ records that were reviewed today include: Hospital records, office notes and testing.  ASSESSMENT AND PLAN:  1.  Paroxysmal atrial fibrillation: No atrial fibrillation was seen on the monitor she were recently. She is maintaining sinus rhythm on Cardizem, metoprolol and amiodarone. However, she is bradycardic and I believe she is symptomatic from this.   Therefore, we will decrease the Cardizem from 240 mg down  to 180 mg. If this does not improve her heart rate, we will go down further to 120 mg and may have to stop it completely. I would prefer to keep her on a small dose of the Cardizem and the metoprolol but the metoprolol is preferred.  2. Chronic anticoagulation: Xarelto, CHADS2VASC=7. Advised her that even though she has gone back into atrial fibrillation, which greatly prefer that she does not stop the Xarelto for least 4 weeks after cardioversion. She is willing to delay her dental procedure until after that time.   Current medicines  are reviewed at length with the patient today.  The patient does not have concerns regarding medicines.  The following changes have been made:  Decreased Cardizem  Labs/ tests ordered today include:  No orders of the defined types were placed in this encounter.     Disposition:   FU with Dr. [place]  Signed, Theodore DemarkBarrett, Rhonda, PA-C  10/14/2015 3:41 PM    [facility] Phone: [phone]) [phone]; Fax: [phone]  This note was written with the assistance of speech recognition software. Please excuse any transcriptional errors.

## 2015-10-27 ENCOUNTER — Other Ambulatory Visit: Payer: Self-pay | Admitting: *Deleted

## 2015-10-27 MED ORDER — RIVAROXABAN 20 MG PO TABS: 20.0000 mg | ORAL_TABLET | Freq: Every day | ORAL | Status: DC

## 2015-10-27 MED ORDER — LOSARTAN POTASSIUM 50 MG PO TABS: 50.0000 mg | ORAL_TABLET | Freq: Every day | ORAL | Status: DC

## 2015-10-28 ENCOUNTER — Telehealth: Payer: Self-pay | Admitting: *Deleted

## 2015-10-28 DIAGNOSIS — I5032 Chronic diastolic (congestive) heart failure: Secondary | ICD-10-CM

## 2015-10-28 DIAGNOSIS — I480 Paroxysmal atrial fibrillation: Secondary | ICD-10-CM

## 2015-10-28 MED ORDER — AMIODARONE HCL 200 MG PO TABS: 200.0000 mg | ORAL_TABLET | Freq: Every day | ORAL | Status: DC

## 2015-10-28 MED ORDER — FUROSEMIDE 40 MG PO TABS: 40.0000 mg | ORAL_TABLET | Freq: Two times a day (BID) | ORAL | Status: DC

## 2015-10-28 NOTE — Telephone Encounter (Signed)
Received call from pt stating she needed refills. She was recently seen on 10/14/15 and forgot to ask for refills. Refills for amiodorone and lasix sent to pt pharmacy.

## 2015-11-02 ENCOUNTER — Encounter (HOSPITAL_COMMUNITY): Payer: Self-pay

## 2015-11-02 ENCOUNTER — Other Ambulatory Visit: Payer: Self-pay

## 2015-11-02 ENCOUNTER — Emergency Department (HOSPITAL_COMMUNITY): Payer: Medicare Other

## 2015-11-02 ENCOUNTER — Emergency Department (HOSPITAL_COMMUNITY)
Admission: EM | Admit: 2015-11-02 | Discharge: 2015-11-03 | Disposition: A | Discharge status: Home / Self Care | Payer: Medicare Other | Attending: Emergency Medicine | Admitting: Emergency Medicine

## 2015-11-02 DIAGNOSIS — Z7901 Long term (current) use of anticoagulants: Secondary | ICD-10-CM | POA: Diagnosis not present

## 2015-11-02 DIAGNOSIS — R0602 Shortness of breath: Secondary | ICD-10-CM | POA: Insufficient documentation

## 2015-11-02 DIAGNOSIS — R5383 Other fatigue: Secondary | ICD-10-CM | POA: Insufficient documentation

## 2015-11-02 DIAGNOSIS — D509 Iron deficiency anemia, unspecified: Secondary | ICD-10-CM | POA: Diagnosis not present

## 2015-11-02 DIAGNOSIS — I10 Essential (primary) hypertension: Secondary | ICD-10-CM | POA: Diagnosis not present

## 2015-11-02 DIAGNOSIS — I4891 Unspecified atrial fibrillation: Secondary | ICD-10-CM | POA: Diagnosis not present

## 2015-11-02 DIAGNOSIS — Z8673 Personal history of transient ischemic attack (TIA), and cerebral infarction without residual deficits: Secondary | ICD-10-CM | POA: Diagnosis not present

## 2015-11-02 DIAGNOSIS — Z8639 Personal history of other endocrine, nutritional and metabolic disease: Secondary | ICD-10-CM | POA: Insufficient documentation

## 2015-11-02 DIAGNOSIS — Z79899 Other long term (current) drug therapy: Secondary | ICD-10-CM | POA: Insufficient documentation

## 2015-11-02 DIAGNOSIS — I5032 Chronic diastolic (congestive) heart failure: Secondary | ICD-10-CM | POA: Insufficient documentation

## 2015-11-02 DIAGNOSIS — H409 Unspecified glaucoma: Secondary | ICD-10-CM | POA: Insufficient documentation

## 2015-11-02 LAB — URINALYSIS, ROUTINE W REFLEX MICROSCOPIC
Bilirubin Urine: NEGATIVE
Glucose, UA: NEGATIVE mg/dL
Ketones, ur: NEGATIVE mg/dL
Leukocytes, UA: NEGATIVE
Nitrite: NEGATIVE
Protein, ur: NEGATIVE mg/dL
Specific Gravity, Urine: 1.011 (ref 1.005–1.030)
pH: 6 (ref 5.0–8.0)

## 2015-11-02 LAB — COMPREHENSIVE METABOLIC PANEL
ALT: 28 U/L (ref 14–54)
AST: 29 U/L (ref 15–41)
Albumin: 4 g/dL (ref 3.5–5.0)
Alkaline Phosphatase: 78 U/L (ref 38–126)
Anion gap: 11 (ref 5–15)
BUN: 21 mg/dL — ABNORMAL HIGH (ref 6–20)
CO2: 26 mmol/L (ref 22–32)
Calcium: 9.5 mg/dL (ref 8.9–10.3)
Chloride: 96 mmol/L — ABNORMAL LOW (ref 101–111)
Creatinine, Ser: 0.96 mg/dL (ref 0.44–1.00)
GFR calc Af Amer: 59 mL/min — ABNORMAL LOW (ref >60)
GFR calc non Af Amer: 51 mL/min — ABNORMAL LOW (ref >60)
Glucose, Bld: 127 mg/dL — ABNORMAL HIGH (ref 65–99)
Potassium: 4.1 mmol/L (ref 3.5–5.1)
Sodium: 133 mmol/L — ABNORMAL LOW (ref 135–145)
Total Bilirubin: 0.5 mg/dL (ref 0.3–1.2)
Total Protein: 6.7 g/dL (ref 6.5–8.1)

## 2015-11-02 LAB — URINE MICROSCOPIC-ADD ON: WBC, UA: NONE SEEN WBC/hpf (ref 0–5)

## 2015-11-02 LAB — CBC WITH DIFFERENTIAL/PLATELET
Basophils Absolute: 0 10*3/uL (ref 0.0–0.1)
Basophils Relative: 0 %
Eosinophils Absolute: 0.1 10*3/uL (ref 0.0–0.7)
Eosinophils Relative: 1 %
HCT: 35.7 % — ABNORMAL LOW (ref 36.0–46.0)
Hemoglobin: 12.4 g/dL (ref 12.0–15.0)
Lymphocytes Relative: 17 %
Lymphs Abs: 1.3 10*3/uL (ref 0.7–4.0)
MCH: 31.1 pg (ref 26.0–34.0)
MCHC: 34.7 g/dL (ref 30.0–36.0)
MCV: 89.5 fL (ref 78.0–100.0)
Monocytes Absolute: 0.7 10*3/uL (ref 0.1–1.0)
Monocytes Relative: 9 %
Neutro Abs: 5.6 10*3/uL (ref 1.7–7.7)
Neutrophils Relative %: 73 %
Platelets: 189 10*3/uL (ref 150–400)
RBC: 3.99 MIL/uL (ref 3.87–5.11)
RDW: 13 % (ref 11.5–15.5)
WBC: 7.7 10*3/uL (ref 4.0–10.5)

## 2015-11-02 MED ORDER — IBUPROFEN 200 MG PO TABS
400.0000 mg | ORAL_TABLET | Freq: Once | ORAL | Status: AC
  Administered 2015-11-02: 400 mg via ORAL
  Filled 2015-11-02: qty 2

## 2015-11-02 NOTE — ED Notes (Signed)
Pt reports that she has been experiencing generalized weakness for a week, periods of SOB today and a headache and dizziness that she attributes to HNT.  She has a hx of HNT, which is usually controlled well w/ medication.  The home health person stated that she is "not herself... Lethargic"  Upon arrival bp was 240/110, she has not complained of SOB since getting in the ambulance, lungs were clear.

## 2015-11-02 NOTE — ED Notes (Signed)
Pt off unit for xray

## 2015-11-02 NOTE — ED Provider Notes (Signed)
CSN: [id number]     Arrival date & time 11/02/15  2011 History   First MD Initiated Contact with Patient 11/02/15 2012     Chief Complaint  Patient presents with  . Shortness of Breath  . Fatigue     (Consider location/radiation/quality/duration/timing/severity/associated sxs/prior Treatment) Patient is a 82 y.o. female presenting with shortness of breath.  Shortness of Breath Severity:  Mild Onset quality:  Gradual Duration:  2 minutes Timing:  Intermittent Chronicity:  New Relieved by:  None tried Worsened by:  Nothing tried Ineffective treatments:  None tried Associated symptoms: no abdominal pain, no chest pain, no cough, no ear pain, no fever and no neck pain   Risk factors: no hx of PE/DVT and no prolonged immobilization     Past Medical History  Diagnosis Date  . Hypertension   . Hyponatremia   . Chronic diastolic CHF (congestive heart failure) (HCC)   . Atrial fibrillation (HCC)   . Glaucoma   . Iron deficiency anemia   . Mitral insufficiency   . Tricuspid insufficiency   . TIA (transient ischemic attack) 1997   Past Surgical History  Procedure Laterality Date  . Abdominal hysterectomy    . Tonsillectomy    . Diagnostic laparoscopy    . Cataract extraction    . Strabismus surgery    . Cardioversion N/A 09/15/2015    Procedure: CARDIOVERSION;  Surgeon: Wendall StadePeter C Nishan, MD;  [facility];  Service: Cardiovascular;  Laterality: N/A;   Family History  Problem Relation Age of Onset  . Heart disease Mother   . Ovarian cancer Mother [id number]  . Heart attack Father 2876  . Heart attack Brother   . Heart attack Son 352  . Hypertension Son    Social History  Substance Use Topics  . Smoking status: Never Smoker   . Smokeless tobacco: Never Used  . Alcohol Use: 0.0 oz/week    0 Standard drinks or equivalent per week     Comment: occasional   OB History    No data available     Review of Systems  Constitutional: Positive for fatigue. Negative for fever.   HENT: Negative for congestion and ear pain.   Eyes: Negative for pain.  Respiratory: Positive for shortness of breath. Negative for cough.   Cardiovascular: Negative for chest pain.  Gastrointestinal: Negative for abdominal pain.  Genitourinary: Negative for dysuria.  Musculoskeletal: Negative for back pain and neck pain.  All other systems reviewed and are negative.     Allergies  Lisinopril  Home Medications   Prior to Admission medications   Medication Sig Start Date End Date Taking? Authorizing Provider  amiodarone (PACERONE) 200 MG tablet Take 1 tablet (200 mg total) by mouth daily. 10/28/15  Yes Peter M [place], MD  calcium carbonate (OS-CAL - DOSED IN MG OF ELEMENTAL CALCIUM) 1250 (500 CA) MG tablet Take 1 tablet by mouth daily with breakfast.    Yes Historical Provider, MD  carteolol (OCUPRESS) 1 % ophthalmic solution Place 1 drop into both eyes 2 (two) times daily.   Yes Historical Provider, MD  CVS TUSSIN DM 100-10 MG/5ML liquid Take 5 mLs by mouth every 4 (four) hours as needed for cough. As needed TAKE 5ML (1TEASPOONFUL) BY MOUTH EVERY 4 (FOUR) HOURS AS NEEDED for cough 06/10/15  Yes Historical Provider, MD  diltiazem (CARDIZEM CD) 180 MG 24 hr capsule Take 1 capsule (180 mg total) by mouth daily. 10/14/15  Yes Rhonda G Barrett, PA-C  ferrous sulfate 325 (65  FE) MG tablet Take 1 tablet (325 mg total) by mouth daily with breakfast. 06/10/15  Yes [name], MD  furosemide (LASIX) 40 MG tablet Take 1 tablet (40 mg total) by mouth 2 (two) times daily. 10/28/15  Yes Peter M [place], MD  KLOR-CON M10 10 MEQ tablet TAKE 1 TABLET (10 MEQ TOTAL) BY MOUTH 2 (TWO) TIMES DAILY. 10/03/15  Yes Peter M [place], MD  latanoprost (XALATAN) 0.005 % ophthalmic solution Place 1 drop into both eyes at bedtime.   Yes Historical Provider, MD  losartan (COZAAR) 50 MG tablet Take 1 tablet (50 mg total) by mouth daily. Take 1 tab by mouth daily 10/27/15  Yes Peter M [place], MD  metoprolol succinate  (TOPROL-XL) 50 MG 24 hr tablet Take 25 mg by mouth daily.   Yes Historical Provider, MD  Multiple Vitamin (MULTIVITAMIN WITH MINERALS) TABS tablet Take 1 tablet by mouth daily.   Yes Historical Provider, MD  mupirocin ointment (BACTROBAN) 2 % Apply 1 application topically daily as needed (for irritation). APPLY ON SKIN EVERY DAY 08/17/15  Yes Historical Provider, MD  rivaroxaban (XARELTO) 20 MG TABS tablet Take 1 tablet (20 mg total) by mouth daily with supper. 10/27/15  Yes Peter M [place], MD  vitamin C (ASCORBIC ACID) 500 MG tablet Take 500 mg by mouth daily.   Yes Historical Provider, MD   There were no vitals taken for this visit. Physical Exam  Constitutional: She is oriented to person, place, and time. She appears well-developed and well-nourished.  HENT:  Head: Normocephalic and atraumatic.  Neck: Normal range of motion.  Cardiovascular: Normal rate and regular rhythm.   Pulmonary/Chest: Effort normal. No stridor. No respiratory distress. She has no wheezes.  Abdominal: Soft. She exhibits no distension.  Musculoskeletal: Normal range of motion. She exhibits no edema or tenderness.  Neurological: She is alert and oriented to person, place, and time. No cranial nerve deficit. Coordination normal.  Skin: Skin is warm and dry.  Nursing note and vitals reviewed.   ED Course  Procedures (including critical care time) Labs Review Labs Reviewed  CBC WITH DIFFERENTIAL/PLATELET - Abnormal; Notable for the following:    HCT 35.7 (*)    All other components within normal limits  COMPREHENSIVE METABOLIC PANEL - Abnormal; Notable for the following:    Sodium 133 (*)    Chloride 96 (*)    Glucose, Bld 127 (*)    BUN 21 (*)    GFR calc non Af Amer 51 (*)    GFR calc Af Amer 59 (*)    All other components within normal limits  URINALYSIS, ROUTINE W REFLEX MICROSCOPIC (NOT AT [facility]) - Abnormal; Notable for the following:    Hgb urine dipstick SMALL (*)    All other components within normal  limits  URINE MICROSCOPIC-ADD ON - Abnormal; Notable for the following:    Squamous Epithelial / LPF 0-5 (*)    Bacteria, UA RARE (*)    All other components within normal limits    Imaging Review Dg Chest 2 View  11/02/2015  CLINICAL DATA:  Shortness of breath with hypertension EXAM: CHEST  2 VIEW COMPARISON:  June 06, 2015 FINDINGS: There is mild bibasilar scarring. There is no edema or consolidation. Heart is upper normal in size with pulmonary vascularity within normal limits. No adenopathy. There is atherosclerotic calcification aorta. There is mild anterior wedging of a lower thoracic vertebral body. IMPRESSION: No edema or consolidation.  Mild bibasilar scarring. Electronically Signed   By: Chrissie Noa  Margarita Grizzle III M.D.   On: 11/02/2015 21:28   I have personally reviewed and evaluated these images and lab results as part of my medical decision-making.   EKG Interpretation None      MDM   Final diagnoses:  None   Sob and fatigue acute onset today but resolved prior to EMS and here she has no complaints. Observed here and BP not too high. Workup negative. Unsure of cause of symptoms, will need to follow closely with PCP. Doubt PE, ACS, infection or neurologic causes at this time. Does have HR in 50's, maybe it gets lower causing her symptoms but observed in ED for >3 hours and asymptomatic. Also has h/o of HR around there multiple times in past and this is an acute presentation so doubt it is related but may be something to consider if she has repeat symptoms.   New Prescriptions: New Prescriptions   No medications on file     I have personally and contemperaneously reviewed labs and imaging and used in my decision making as above.   A medical screening exam was performed and I feel the patient has had an appropriate workup for their chief complaint at this time and likelihood of emergent condition existing is low. Their vital signs are stable. They have been counseled on  decision, discharge, follow up and which symptoms necessitate immediate return to the emergency department.  They verbally stated understanding and agreement with plan and discharged in stable condition.      Marily Memos, MD 11/03/15 1059

## 2015-11-07 ENCOUNTER — Telehealth: Payer: Self-pay | Admitting: Cardiology

## 2015-11-07 NOTE — Telephone Encounter (Signed)
I don't think any of her meds would cause a cough. She may have some blood tinged sputum on Xarelto. I agree that iron is probably the cause of her nausea. If this has resolved we will observe. Amiodarone can cause nausea but would like to continue to help keep her in NSR.   Inaki Vantine [place] MD, [facility]

## 2015-11-07 NOTE — Telephone Encounter (Signed)
SPOKE TO PATIENT SHE STATES STOMACH HAS BEEN ACHING ,NAGGING HEADACHE Patient states she thinks the iron tablet is causing the issue She stopped last Friday--(sat and Sunday) she states that she feels a little better Per patient Dr SwazilandJORDAN started the medications- but she went to primary recently - iron level was normal  Patient also had a question- ( coughing - at times  she has blood tinge sputum) she wanted to know if it has anything to do with taking amiodarone  Patient went to ER last week -on WED. B/P 267/120 - it went down - today's pressure was 117/50  Pulse 48  Patient aware will defer to Dr SwazilandJORDAN

## 2015-11-07 NOTE — Telephone Encounter (Signed)
Pt calling c/o BP running high last week-went to ER-they couldn't find anything wrong-last few weeks having nausea-coughing up blood-very little thinks it's coming from the Amiodarone-pls call [phone]--pls advise

## 2015-11-07 NOTE — Telephone Encounter (Signed)
Spoke to patient. She verbalized understanding. 

## 2015-12-13 ENCOUNTER — Emergency Department (HOSPITAL_COMMUNITY)
Admission: EM | Admit: 2015-12-13 | Discharge: 2015-12-14 | Disposition: A | Discharge status: Home / Self Care | Payer: Medicare Other | Attending: Emergency Medicine | Admitting: Emergency Medicine

## 2015-12-13 ENCOUNTER — Encounter (HOSPITAL_COMMUNITY): Payer: Self-pay | Admitting: Emergency Medicine

## 2015-12-13 ENCOUNTER — Ambulatory Visit (HOSPITAL_COMMUNITY): Admit: 2015-12-13 | Payer: Self-pay | Admitting: Cardiovascular Disease

## 2015-12-13 ENCOUNTER — Encounter (HOSPITAL_COMMUNITY): Payer: Self-pay

## 2015-12-13 ENCOUNTER — Emergency Department (HOSPITAL_COMMUNITY): Payer: Medicare Other

## 2015-12-13 DIAGNOSIS — H409 Unspecified glaucoma: Secondary | ICD-10-CM | POA: Diagnosis not present

## 2015-12-13 DIAGNOSIS — Z7901 Long term (current) use of anticoagulants: Secondary | ICD-10-CM | POA: Diagnosis not present

## 2015-12-13 DIAGNOSIS — I10 Essential (primary) hypertension: Secondary | ICD-10-CM | POA: Insufficient documentation

## 2015-12-13 DIAGNOSIS — Z862 Personal history of diseases of the blood and blood-forming organs and certain disorders involving the immune mechanism: Secondary | ICD-10-CM | POA: Diagnosis not present

## 2015-12-13 DIAGNOSIS — I5032 Chronic diastolic (congestive) heart failure: Secondary | ICD-10-CM | POA: Diagnosis not present

## 2015-12-13 DIAGNOSIS — R058 Other specified cough: Secondary | ICD-10-CM

## 2015-12-13 DIAGNOSIS — G933 Postviral fatigue syndrome: Secondary | ICD-10-CM | POA: Insufficient documentation

## 2015-12-13 DIAGNOSIS — R05 Cough: Secondary | ICD-10-CM

## 2015-12-13 DIAGNOSIS — Z79899 Other long term (current) drug therapy: Secondary | ICD-10-CM | POA: Diagnosis not present

## 2015-12-13 DIAGNOSIS — I4891 Unspecified atrial fibrillation: Secondary | ICD-10-CM | POA: Diagnosis not present

## 2015-12-13 DIAGNOSIS — Z8673 Personal history of transient ischemic attack (TIA), and cerebral infarction without residual deficits: Secondary | ICD-10-CM | POA: Diagnosis not present

## 2015-12-13 DIAGNOSIS — R0602 Shortness of breath: Secondary | ICD-10-CM | POA: Diagnosis present

## 2015-12-13 LAB — I-STAT TROPONIN, ED: Troponin i, poc: 0.01 ng/mL (ref 0.00–0.08)

## 2015-12-13 SURGERY — CARDIOVERSION
Anesthesia: Monitor Anesthesia Care

## 2015-12-13 MED ORDER — ALBUTEROL SULFATE (2.5 MG/3ML) 0.083% IN NEBU
5.0000 mg | INHALATION_SOLUTION | Freq: Once | RESPIRATORY_TRACT | Status: AC
  Administered 2015-12-13: 5 mg via RESPIRATORY_TRACT
  Filled 2015-12-13: qty 6

## 2015-12-13 MED ORDER — BENZONATATE 100 MG PO CAPS
200.0000 mg | ORAL_CAPSULE | Freq: Once | ORAL | Status: AC
  Administered 2015-12-14: 200 mg via ORAL
  Filled 2015-12-13: qty 2

## 2015-12-13 MED ORDER — MAGNESIUM SULFATE 2 GM/50ML IV SOLN
2.0000 g | Freq: Once | INTRAVENOUS | Status: AC
  Administered 2015-12-14: 2 g via INTRAVENOUS
  Filled 2015-12-13: qty 50

## 2015-12-13 NOTE — ED Notes (Signed)
Pt arrives via EMS from home with shortness of breath and barky cough ongoing this week. Also reports sore throat. States SHOB worse with exertion, cough producing yellow sputum with some blood present. Seen yesterday by PCP and given antibiotic injection.   EMS established IV, gave 125 MG solumedrol and two duonebs, expiratory wheezing noted.

## 2015-12-13 NOTE — ED Provider Notes (Signed)
History  By signing my name below, I, [name] Jones, attest that this documentation has been prepared under the direction and in the presence of [name] Crumble, MD. Electronically Signed: Earmon Jones, ED Scribe. 12/14/2015. 2:26 AM  Chief Complaint  Patient presents with  . Cough  . Shortness of Breath   The history is provided by the patient, medical records and the EMS personnel. No language interpreter was used.   HPI Comments:  [name] Jones is a 82 y.o. female with PMHx of HTN, CHF, atrial fibrillation brought in by EMS, who presents to the Emergency Department complaining of worsening cough that began yesterday. She reports associated sore throat. Today she reports some wheezing and SOB. She states she went to her PCP yesterday and received an antibiotic injection. She states she received a breathing treatment from EMS that gave some relief. Pt reports taking Delsym and Tylenol, last dose earlier this evening. Exertion worsens her SOB. She denies alleviating factors. She denies fevers, chills, CP. She denies any recent travel. She denies any known sick contacts. She denies PMHx of asthma or COPD. Cardiologist is Dr. Peter Swaziland.  Past Medical History  Diagnosis Date  . Hypertension   . Hyponatremia   . Chronic diastolic CHF (congestive heart failure) (HCC)   . Atrial fibrillation (HCC)   . Glaucoma   . Iron deficiency anemia   . Mitral insufficiency   . Tricuspid insufficiency   . TIA (transient ischemic attack) 1997   Past Surgical History  Procedure Laterality Date  . Abdominal hysterectomy    . Tonsillectomy    . Diagnostic laparoscopy    . Cataract extraction    . Strabismus surgery    . Cardioversion N/A 09/15/2015    Procedure: CARDIOVERSION;  Surgeon: Wendall Stade, MD;  Location: [facility] ENDOSCOPY;  Service: Cardiovascular;  Laterality: N/A;   Family History  Problem Relation Age of Onset  . Heart disease Mother   . Ovarian cancer Mother 27  . Heart attack  Father 7  . Heart attack Brother   . Heart attack Son 47  . Hypertension Son    Social History  Substance Use Topics  . Smoking status: Never Smoker   . Smokeless tobacco: Never Used  . Alcohol Use: 0.0 oz/week    0 Standard drinks or equivalent per week     Comment: occasional   OB History    No data available     Review of Systems A complete 10 system review of systems was obtained and all systems are negative except as noted in the HPI and PMH.   Allergies  Lisinopril  Home Medications   Prior to Admission medications   Medication Sig Start Date End Date Taking? Authorizing Provider  acetaminophen (TYLENOL) 325 MG tablet Take 650 mg by mouth at bedtime.   Yes Historical Provider, MD  amiodarone (PACERONE) 200 MG tablet Take 1 tablet (200 mg total) by mouth daily. 10/28/15  Yes Peter M Swaz[place]and, MD  calcium carbonate (OS-CAL - DOSED IN MG OF ELEMENTAL CALCIUM) 1250 (500 CA) MG tablet Take 1 tablet by mouth daily with breakfast.    Yes Historical Provider, MD  carteolol (OCUPRESS) 1 % ophthalmic solution Place 1 drop into both eyes 2 (two) times daily.   Yes Historical Provider, MD  [facility] TUSSIN DM 100-10 MG/5ML liquid Take 5 mLs by mouth every 4 (four) hours as needed for cough. As needed TAKE (1TEASPOONFUL) BY MOUTH EVERY 4 (FOUR) HOURS AS NEEDED for cough 06/10/15  Yes Historical Provider, MD  diltiazem (CARDIZEM CD) 180 MG 24 hr capsule Take 1 capsule (180 mg total) by mouth daily. 10/14/15  Yes Rhonda G Barrett, PA-C  furosemide (LASIX) 40 MG tablet Take 1 tablet (40 mg total) by mouth 2 (two) times daily. 10/28/15  Yes Peter M [place]az[place], MD  KLOR-CON M10 10 MEQ tablet TAKE 1 TABLET (10 MEQ TOTAL) BY MOUTH 2 (TWO) TIMES DAILY. 10/03/15  Yes Peter M [place], MD  latanoprost (XALATAN) 0.005 % ophthalmic solution Place 1 drop into both eyes at bedtime.   Yes Historical Provider, MD  losartan (COZAAR) 50 MG tablet Take 1 tablet (50 mg total) by mouth daily. Take 1 tab by mouth daily  10/27/15  Yes Peter M [place]az[place], MD  metoprolol succinate (TOPROL-XL) 50 MG 24 hr tablet Take 25 mg by mouth daily.   Yes Historical Provider, MD  Multiple Vitamin (MULTIVITAMIN WITH MINERALS) TABS tablet Take 1 tablet by mouth daily.   Yes Historical Provider, MD  rivaroxaban (XARELTO) 20 MG TABS tablet Take 1 tablet (20 mg total) by mouth daily with supper. 10/27/15  Yes Peter M [place], MD  vitamin C (ASCORBIC ACID) 500 MG tablet Take 500 mg by mouth daily.   Yes Historical Provider, MD  ferrous sulfate 325 (65 FE) MG tablet Take 1 tablet (325 mg total) by mouth daily with breakfast. Patient not taking: Reported on 12/13/2015 06/10/15   [name], MD   Triage Vitals: BP 226/83 mmHg  Pulse 69  Temp(Src) 98.7 F (37.1 C) (Oral)  Resp 18  Ht 5' (1.524 m)  Wt 113 lb (51.256 kg)  BMI 22.07 kg/m2  SpO2 99% Physical Exam  Constitutional: She is oriented to person, place, and time. She appears well-developed and well-nourished. No distress.  Frequent coughing on examination.  HENT:  Head: Normocephalic and atraumatic.  Nose: Nose normal.  Mouth/Throat: Oropharynx is clear and moist. No oropharyngeal exudate.  Eyes: Conjunctivae and EOM are normal. Pupils are equal, round, and reactive to light. No scleral icterus.  Neck: Normal range of motion. Neck supple. No JVD present. No tracheal deviation present. No thyromegaly present.  Cardiovascular: Normal rate, regular rhythm and normal heart sounds.  Exam reveals no gallop and no friction rub.   No murmur heard. Pulmonary/Chest: Effort normal. No respiratory distress. She has wheezes. She has rhonchi. She exhibits no tenderness.  Abdominal: Soft. Bowel sounds are normal. She exhibits no distension and no mass. There is no tenderness. There is no rebound and no guarding.  Musculoskeletal: Normal range of motion. She exhibits no edema or tenderness.  Lymphadenopathy:    She has no cervical adenopathy.  Neurological: She is alert and oriented to  person, place, and time. No cranial nerve deficit. She exhibits normal muscle tone.  Skin: Skin is warm and dry. No rash noted. No erythema. No pallor.  Nursing note and vitals reviewed.   ED Course  Procedures (including critical care time) DIAGNOSTIC STUDIES: Oxygen Saturation is 99% on RA, normal by my interpretation.   COORDINATION OF CARE: 11:09 PM- Will order labs and informed pt of negative CXR. Pt verbalizes understanding and agrees to plan.  Medications  albuterol (PROVENTIL) (2.5 MG/3ML) 0.083% nebulizer solution 5 mg (5 mg Nebulization Given 12/13/15 2257)  magnesium sulfate IVPB 2 g 50 mL (2 g Intravenous New Bag/Given 12/14/15 0022)  benzonatate (TESSALON) capsule 200 mg (200 mg Oral Given 12/14/15 0023)   Labs Review Labs Reviewed  CBC WITH DIFFERENTIAL/PLATELET - Abnormal; Notable for the following:  WBC 14.8 (*)    RBC 3.73 (*)    Hemoglobin 11.7 (*)    HCT 34.2 (*)    Neutro Abs 13.8 (*)    Lymphs Abs 0.6 (*)    All other components within normal limits  BRAIN NATRIURETIC PEPTIDE - Abnormal; Notable for the following:    B Natriuretic Peptide 196.7 (*)    All other components within normal limits  BASIC METABOLIC PANEL - Abnormal; Notable for the following:    Sodium 132 (*)    Chloride 98 (*)    Glucose, Bld 129 (*)    BUN 24 (*)    Creatinine, Ser 1.01 (*)    GFR calc non Af Amer 48 (*)    GFR calc Af Amer 55 (*)    All other components within normal limits  PROTIME-INR - Abnormal; Notable for the following:    Prothrombin Time 31.2 (*)    INR 3.08 (*)    All other components within normal limits  I-STAT TROPOININ, ED    Imaging Review Dg Chest 2 View  12/13/2015  CLINICAL DATA:  Initial evaluation for acute cough, cold symptoms for 2 days. EXAM: CHEST  2 VIEW COMPARISON:  Prior study from 11/02/2015. FINDINGS: Transverse heart size at the upper limits of normal, stable. Mediastinal silhouette within normal limits. Atheromatous plaque within the  aortic arch. Lungs are normally inflated. Bibasilar linear scarring is similar to prior. No focal infiltrates identified. No pulmonary edema or pleural effusion. No pneumothorax. No acute osseus abnormality.  Diffuse osteopenia noted. IMPRESSION: 1. No active cardiopulmonary disease. 2. Mild bibasilar scarring, similar to prior. Electronically Signed   By: Rise Mu M.D.   On: 12/13/2015 22:55   Ct Chest Wo Contrast  12/14/2015  CLINICAL DATA:  Wheezing and shortness of breath. Symptoms for 1 week. Cough. EXAM: CT CHEST WITHOUT CONTRAST TECHNIQUE: Multidetector CT imaging of the chest was performed following the standard protocol without IV contrast. COMPARISON:  Radiographs yesterday. FINDINGS: Mediastinum/Lymph Nodes: Mild atherosclerosis of the thoracic aorta. No aneurysm or periaortic soft tissue stranding. Coronary artery calcifications are seen. No pericardial effusion. No mediastinal or evidence of hilar adenopathy. Lungs/Pleura: Small left pleural effusion. Linear atelectasis or scarring in the left lower and right middle lobe. There is smooth septal thickening, mild. No airspace consolidation to suggest pneumonia. Upper abdomen: No acute findings. Musculoskeletal: No chest wall mass or suspicious bone lesions identified. Minimal anterior wedging of T11 vertebral body. Prominent Schmorl's node superior endplate of T12. IMPRESSION: 1. Small left pleural effusion. Smooth septal thickening is suspicious for mild pulmonary edema. 2. Linear atelectasis or scarring.  No pneumonia. 3. Atherosclerosis including coronary artery calcifications. Electronically Signed   By: Rubye Oaks M.D.   On: 12/14/2015 02:16   I have personally reviewed and evaluated these images and lab results as part of my medical decision-making.   EKG Interpretation   Date/Time:  Wednesday Dec 14 2015 00:03:00 EDT Ventricular Rate:  66 PR Interval:  197 QRS Duration: 90 QT Interval:  424 QTC Calculation: 444 R  Axis:   64 Text Interpretation:  Sinus rhythm Consider left atrial enlargement No  significant change since last tracing Confirmed by Erroll Luna  [phone]) on 12/14/2015 12:21:54 AM      MDM   Final diagnoses:  None    Patient presents emergency department for shortness of breath and cough. Chest x-rays negative for pneumonia, will obtain CT scan as she is high risk, 82 years old, to evaluate for  pneumonia.  Labs are unremarkable. Patient given DuoNeb and LawyerTessalon Perles for her cough. Patient also given magnesium for wheezing. Upon repeat evaluation, patient states she feels much better, cough is still present but improved. We'll discharge home with Jerilynn Somessalon Perles prescription to take as needed. CT scan is negative for pneumonia. She appears well and in no acute distress, vital signs were within her normal limits and she is safe for discharge.   I personally performed the services described in this documentation, which was scribed in my presence. The recorded information has been reviewed and is accurate.     [name] CrumbleAdeleke Eashan Schipani, MD 12/14/15 [phone]

## 2015-12-14 ENCOUNTER — Encounter (HOSPITAL_COMMUNITY): Payer: Self-pay | Admitting: Radiology

## 2015-12-14 ENCOUNTER — Emergency Department (HOSPITAL_COMMUNITY): Payer: Medicare Other

## 2015-12-14 DIAGNOSIS — G933 Postviral fatigue syndrome: Secondary | ICD-10-CM | POA: Diagnosis not present

## 2015-12-14 LAB — CBC WITH DIFFERENTIAL/PLATELET
Basophils Absolute: 0 10*3/uL (ref 0.0–0.1)
Basophils Relative: 0 %
Eosinophils Absolute: 0.1 10*3/uL (ref 0.0–0.7)
Eosinophils Relative: 0 %
HCT: 34.2 % — ABNORMAL LOW (ref 36.0–46.0)
Hemoglobin: 11.7 g/dL — ABNORMAL LOW (ref 12.0–15.0)
Lymphocytes Relative: 4 %
Lymphs Abs: 0.6 10*3/uL — ABNORMAL LOW (ref 0.7–4.0)
MCH: 31.4 pg (ref 26.0–34.0)
MCHC: 34.2 g/dL (ref 30.0–36.0)
MCV: 91.7 fL (ref 78.0–100.0)
Monocytes Absolute: 0.4 10*3/uL (ref 0.1–1.0)
Monocytes Relative: 3 %
Neutro Abs: 13.8 10*3/uL — ABNORMAL HIGH (ref 1.7–7.7)
Neutrophils Relative %: 93 %
Platelets: 156 10*3/uL (ref 150–400)
RBC: 3.73 MIL/uL — ABNORMAL LOW (ref 3.87–5.11)
RDW: 12.5 % (ref 11.5–15.5)
WBC: 14.8 10*3/uL — ABNORMAL HIGH (ref 4.0–10.5)

## 2015-12-14 LAB — BASIC METABOLIC PANEL
Anion gap: 10 (ref 5–15)
BUN: 24 mg/dL — ABNORMAL HIGH (ref 6–20)
CO2: 24 mmol/L (ref 22–32)
Calcium: 9.3 mg/dL (ref 8.9–10.3)
Chloride: 98 mmol/L — ABNORMAL LOW (ref 101–111)
Creatinine, Ser: 1.01 mg/dL — ABNORMAL HIGH (ref 0.44–1.00)
GFR calc Af Amer: 55 mL/min — ABNORMAL LOW (ref >60)
GFR calc non Af Amer: 48 mL/min — ABNORMAL LOW (ref >60)
Glucose, Bld: 129 mg/dL — ABNORMAL HIGH (ref 65–99)
Potassium: 4.3 mmol/L (ref 3.5–5.1)
Sodium: 132 mmol/L — ABNORMAL LOW (ref 135–145)

## 2015-12-14 LAB — BRAIN NATRIURETIC PEPTIDE: B Natriuretic Peptide: 196.7 pg/mL — ABNORMAL HIGH (ref 0.0–100.0)

## 2015-12-14 LAB — PROTIME-INR
INR: 3.08 — ABNORMAL HIGH (ref 0.00–1.49)
Prothrombin Time: 31.2 seconds — ABNORMAL HIGH (ref 11.6–15.2)

## 2015-12-14 MED ORDER — BENZONATATE 100 MG PO CAPS: 100.0000 mg | ORAL_CAPSULE | Freq: Three times a day (TID) | ORAL | Status: DC | PRN

## 2015-12-14 NOTE — ED Notes (Signed)
Pt left at this time with all belongings.  

## 2015-12-14 NOTE — Discharge Instructions (Signed)
Cough, Adult Ms. [name] Jones, take prescription cough medicine 3 times a day as needed for your cough.  See your primary doctor within 3 days for close follow up. If symptoms worsen, come back to the ED immediately. Thank you. A cough helps to clear your throat and lungs. A cough may last only 2-3 weeks (acute), or it may last longer than 8 weeks (chronic). Many different things can cause a cough. A cough may be a sign of an illness or another medical condition. HOME CARE  Pay attention to any changes in your cough.  Take medicines only as told by your doctor.  If you were prescribed an antibiotic medicine, take it as told by your doctor. Do not stop taking it even if you start to feel better.  Talk with your doctor before you try using a cough medicine.  Drink enough fluid to keep your pee (urine) clear or pale yellow.  If the air is dry, use a cold steam vaporizer or humidifier in your home.  Stay away from things that make you cough at work or at home.  If your cough is worse at night, try using extra pillows to raise your head up higher while you sleep.  Do not smoke, and try not to be around smoke. If you need help quitting, ask your doctor.  Do not have caffeine.  Do not drink alcohol.  Rest as needed. GET HELP IF:  You have new problems (symptoms).  You cough up yellow fluid (pus).  Your cough does not get better after 2-3 weeks, or your cough gets worse.  Medicine does not help your cough and you are not sleeping well.  You have pain that gets worse or pain that is not helped with medicine.  You have a fever.  You are losing weight and you do not know why.  You have night sweats. GET HELP RIGHT AWAY IF:  You cough up blood.  You have trouble breathing.  Your heartbeat is very fast.   This information is not intended to replace advice given to you by your health care provider. Make sure you discuss any questions you have with your health care provider.     Document Released: 03/22/2011 Document Revised: 03/30/2015 Document Reviewed: 09/15/2014 Elsevier Interactive Patient Education [employer]2016 Elsevier Inc.

## 2015-12-23 ENCOUNTER — Ambulatory Visit (INDEPENDENT_AMBULATORY_CARE_PROVIDER_SITE_OTHER): Payer: Medicare Other | Admitting: Cardiology

## 2015-12-23 ENCOUNTER — Encounter: Payer: Self-pay | Admitting: Cardiology

## 2015-12-23 VITALS — BP 182/74 | HR 55 | Ht 60.0 in | Wt 115.0 lb

## 2015-12-23 DIAGNOSIS — R001 Bradycardia, unspecified: Secondary | ICD-10-CM | POA: Diagnosis not present

## 2015-12-23 DIAGNOSIS — I5032 Chronic diastolic (congestive) heart failure: Secondary | ICD-10-CM

## 2015-12-23 DIAGNOSIS — I10 Essential (primary) hypertension: Secondary | ICD-10-CM

## 2015-12-23 DIAGNOSIS — I480 Paroxysmal atrial fibrillation: Secondary | ICD-10-CM | POA: Diagnosis not present

## 2015-12-23 MED ORDER — AMLODIPINE BESYLATE 2.5 MG PO TABS: 2.5000 mg | ORAL_TABLET | Freq: Every day | ORAL | Status: DC

## 2015-12-23 MED ORDER — LOSARTAN POTASSIUM 100 MG PO TABS: 100.0000 mg | ORAL_TABLET | Freq: Every day | ORAL | Status: DC

## 2015-12-23 NOTE — Progress Notes (Signed)
Cardiology Office Note   Date:  12/23/2015   ID:  [name] Jones, [name] Jones [birth year], MRN [id number]  PCP:  Lorenda Peck, MD  Cardiologist:   Dexter Signor Swaz[place], MD   Chief Complaint  Patient presents with  . 2 MONTHS  . Shortness of Breath      History of Present Illness: [name] Jones is a 82 y.o. female who presents for follow up CHF and atrial fibrillation.  She has a history of HTN. In early November 2016 she was noted to have new onset atrial fibrillation. She was started on Xarelto. She deteriorated and was admitted with acute diastolic CHF. She was diuresed with IV lasix. She had hyponatremia. She had an Echo in May 2016 while in [facility]R showing Normal LV function with grade 2 diastolic dysfunction. There was moderate MR and mild to moderate TR. Mild pulmonary HTN. Repeat Echo in hospital showed normal LV function with severe MR and TR and mod to severe biatrial enlargement.  She later underwent DCCV on 09/15/15. On follow up she was noted to be bradycardic with HR into low 40s. Her metoprolol and cardizem doses were reduced.   On follow up today she notes recent viral URI. Seen in ED and started on Tessalon and Mucinex. Still with nonproductive cough. No fever. SOB is better. No palpitations. No bleeding. No edema.     Past Medical History  Diagnosis Date  . Hypertension   . Hyponatremia   . Chronic diastolic CHF (congestive heart failure) (HCC)   . Atrial fibrillation (HCC)   . Glaucoma   . Iron deficiency anemia   . Mitral insufficiency   . Tricuspid insufficiency   . TIA (transient ischemic attack) 1997    Past Surgical History  Procedure Laterality Date  . Abdominal hysterectomy    . Tonsillectomy    . Diagnostic laparoscopy    . Cataract extraction    . Strabismus surgery    . Cardioversion N/A 09/15/2015    Procedure: CARDIOVERSION;  Surgeon: Wendall Stade, MD;  Location: [facility] ENDOSCOPY;  Service: Cardiovascular;  Laterality: N/A;     Current  Outpatient Prescriptions  Medication Sig Dispense Refill  . acetaminophen (TYLENOL) 325 MG tablet Take 650 mg by mouth at bedtime.    Marland Kitchen amiodarone (PACERONE) 200 MG tablet Take 1 tablet (200 mg total) by mouth daily. 90 tablet 3  . calcium carbonate (OS-CAL - DOSED IN MG OF ELEMENTAL CALCIUM) 1250 (500 CA) MG tablet Take 1 tablet by mouth daily with breakfast.     . carteolol (OCUPRESS) 1 % ophthalmic solution Place 1 drop into both eyes 2 (two) times daily.    . [facility] TUSSIN DM 100-10 MG/5ML liquid Take 5 mLs by mouth every 4 (four) hours as needed for cough. As needed TAKE (1TEASPOONFUL) BY MOUTH EVERY 4 (FOUR) HOURS AS NEEDED for cough  0  . furosemide (LASIX) 40 MG tablet Take 1 tablet (40 mg total) by mouth 2 (two) times daily. 180 tablet 3  . KLOR-CON M10 10 MEQ tablet TAKE 1 TABLET (10 MEQ TOTAL) BY MOUTH 2 (TWO) TIMES DAILY. 60 tablet 3  . latanoprost (XALATAN) 0.005 % ophthalmic solution Place 1 drop into both eyes at bedtime.    . metoprolol succinate (TOPROL-XL) 50 MG 24 hr tablet Take 25 mg by mouth daily.    . Multiple Vitamin (MULTIVITAMIN WITH MINERALS) TABS tablet Take 1 tablet by mouth daily.    . rivaroxaban (XARELTO) 20 MG TABS tablet Take  1 tablet (20 mg total) by mouth daily with supper. 30 tablet 11  . vitamin C (ASCORBIC ACID) 500 MG tablet Take 500 mg by mouth daily.    Marland Kitchen amLODipine (NORVASC) 2.5 MG tablet Take 1 tablet (2.5 mg total) by mouth daily. 180 tablet 3  . losartan (COZAAR) 100 MG tablet Take 1 tablet (100 mg total) by mouth daily. 90 tablet 3  . [DISCONTINUED] ferrous sulfate 325 (65 FE) MG tablet Take 1 tablet (325 mg total) by mouth daily with breakfast. (Patient not taking: Reported on 12/13/2015) 30 tablet 3   No current facility-administered medications for this visit.    Allergies:   Lisinopril    Social History:  The patient  reports that she has never smoked. She has never used smokeless tobacco. She reports that she drinks alcohol. She reports that  she does not use illicit drugs.   Family History:  The patient's family history includes Heart attack in her brother; Heart attack (age of onset: 37) in her son; Heart attack (age of onset: 24) in her father; Heart disease in her mother; Hypertension in her son; Ovarian cancer (age of onset: 77) in her mother.    ROS:  Please see the history of present illness.   Otherwise, review of systems are positive for none.   All other systems are reviewed and negative.    PHYSICAL EXAM: VS:  BP 182/74 mmHg  Pulse 55  Ht 5' (1.524 m)  Wt 52.164 kg (115 lb)  BMI 22.46 kg/m2 , BMI Body mass index is 22.46 kg/(m^2). GEN: Well nourished, elderly, in no acute distress HEENT: normal Neck: no JVD, carotid bruits, or masses Cardiac: RRR; normal S1-2. Gr 2/6 systolic murmur at the apex. No edema. Respiratory:  clear to auscultation bilaterally, normal work of breathing GI: soft, nontender, nondistended, + BS MS: no deformity or atrophy Skin: warm and dry, no rash Neuro:  Strength and sensation are intact Psych: euthymic mood, full affect   EKG:  EKG is ordered today. Reviewed from ED 12/14/15- NSR  rate 66. Otherwise normal.   I have personally reviewed and interpreted this study.     Recent Labs: 09/09/2015: TSH 2.54 11/02/2015: ALT 28 12/13/2015: B Natriuretic Peptide 196.7*; BUN 24*; Creatinine, Ser 1.01*; Hemoglobin 11.7*; Platelets 156; Potassium 4.3; Sodium 132*    Lipid Panel    Component Value Date/Time   CHOL 161 06/08/2015 2322   TRIG 76 06/08/2015 2322   HDL 53 06/08/2015 2322   CHOLHDL 3.0 06/08/2015 2322   VLDL 15 06/08/2015 2322   LDLCALC 93 06/08/2015 2322      Wt Readings from Last 3 Encounters:  12/23/15 52.164 kg (115 lb)  12/13/15 51.256 kg (113 lb)  10/14/15 53.842 kg (118 lb 11.2 oz)      Other studies Reviewed: Additional studies/ records that were reviewed today include: Hospital records. Review of the above records demonstrates:  Echo:Study  Conclusions  - Left ventricle: The cavity size was normal. Systolic function was normal. The estimated ejection fraction was in the range of 60% to 65%. Wall motion was normal; there were no regional wall motion abnormalities. - Aortic valve: There was trivial regurgitation. - Mitral valve: Calcified annulus. There was severe regurgitation. - Left atrium: The atrium was moderately to severely dilated. - Right atrium: The atrium was moderately to severely dilated. - Tricuspid valve: There was severe regurgitation. - Pulmonary arteries: Systolic pressure was moderately to severely increased. - Pericardium, extracardiac: There was a right pleural effusion.  There was a left pleural effusion.   ASSESSMENT AND PLAN:  1.  Atrial fibrillation s/p DCCV in February. Maintaining NSR on amiodarone. Will continue 200 mg daily. Continue metoprolol and Xarelto.   2. Chronic diastolic CHF. Exacerbated by Afib. On appropriate diuretic therapy and appears euvolemic.   3. Hyponatremia. Follow up lab work.  4. Moderate to severe MR and TR.  I suspect worsening MR/TR related to her acutely decompensated state. Will treat medically. She is not a candidate for valve surgery.   5. Iron deficiency anemia.  6. HTN- poorly controlled. Recommend stopping Cardizem due to bradycardia. Will increase losartan to 100 mg daily. Start amlodipine 2.5 mg daily.   Current medicines are reviewed at length with the patient today.  The patient does not have concerns regarding medicines.  The following changes have been made:  no change  Labs/ tests ordered today include:   Orders Placed This Encounter  Procedures  . Comprehensive Metabolic Panel (CMET)  . TSH   DCCV scheduled.   Disposition:   FU with 3 months with CMET and TSH.  Signed, Deaundre Allston SwazilandJordan, MD  12/23/2015 5:51 PM    [facility] [address], [place], [place], [address] Phone [phone], Fax [phone]

## 2015-12-23 NOTE — Patient Instructions (Signed)
Stop taking diltiazem (Cardizem CD)  Start amlodipine 2.5 mg daily for BP  Increase losartan to 100 mg daily  We will follow up in 3 months with lab work

## 2015-12-28 ENCOUNTER — Telehealth: Payer: Self-pay | Admitting: Cardiology

## 2015-12-28 NOTE — Telephone Encounter (Signed)
Returned call to patient.She stated since she increased losartan to 100 mg daily on 12/23/15 her B/P has been low.Stated she felt better taking losartan 50 mg daily.She has felt dizzy,heaviness in chest and pain across mid back since increasing losartan.Stated when she does too much she has pain across mid back this is not new.She has had this for a while.Stated she has a UTI and started on cipro 500 mg twice a day this past Monday 6/2.B/P readings 93/44 pulse 41,123/62 63,114/60 47,102/54 45,101/48 48,109/49 46,152/65 61,99/46 47.Dr.Jordan out of office this afternoon.Advised to take losartan 50 mg in morning. Advised I will speak to him 12/29/15 and call you back.

## 2015-12-28 NOTE — Telephone Encounter (Signed)
Pt called in stating that since she has been taking Losartan she has been experiencing low BP(99/46) and a low pulse rate(46). She is wanting to speak with Elnita MaxwellCheryl as soon as possible about adjusting this medication. Please f/u with her.  Thanks

## 2016-01-03 NOTE — Telephone Encounter (Signed)
Returned call to patient 12/29/15.Dr.Jordan advised stop amlodipine.Continue losartan 100 mg daily.Advised continue to monitor B/P and call back if continues to be low or if she does not feel better.

## 2016-01-16 ENCOUNTER — Other Ambulatory Visit: Payer: Self-pay | Admitting: *Deleted

## 2016-01-16 MED ORDER — METOPROLOL SUCCINATE ER 25 MG PO TB24: 25.0000 mg | ORAL_TABLET | Freq: Every day | ORAL | Status: DC

## 2016-01-16 NOTE — Telephone Encounter (Signed)
Rx(s) sent to pharmacy electronically.  

## 2016-02-15 ENCOUNTER — Other Ambulatory Visit: Payer: Self-pay | Admitting: Cardiology

## 2016-02-16 NOTE — Telephone Encounter (Signed)
REFILL 

## 2016-02-28 ENCOUNTER — Telehealth: Payer: Self-pay | Admitting: Cardiology

## 2016-02-28 NOTE — Telephone Encounter (Signed)
Mrs. [name] Jones is calling because Dr. SwazilandJordan placed her on a medication (Losartan 100mg  ) and she felt lethargic and she cut it back to 50mg  as well as her Lasik 40mg  she is wanting to make sure she is not doing any harm to herself . As well as she has a cough . Also she is needing another lab order sent to her , she misplaced the one she had . Please call

## 2016-02-28 NOTE — Telephone Encounter (Signed)
Returned call to patient-  Pt states has continued to feel tired and have no energy since medication change 6/7.   Pt reports she decreased her Losartan to 50 mg daily (prescribed as 100mg  daily) and her Lasik to 40mg  daily (prescribed as 40mg  BID) approximately 1 week ago and is now feeling better, increase in energy.  Pt states she monitors her BP and weight every morning, BP- 130/60 105/52, 111/54.  She reports her weight is decreased 1 lb in the last week.    Pt also reports a productive cough-yellow/grey/brown mucous, denies fever/chills. Reports she has to take cough syrup every night to sleep. Reports SOB but denies increased from baseline, able to speak in full sentences without distress noted.  Advised to contact PCP regarding cough.    Advised that I will route to MD Swazil[place]Jordan regarding medications and s/sx she is having.  Advised to continue medications until further recommendations from MD.  Pt verbalized understanding.  Pt also requesting a copy of lab requisitions for future lab work to be resent-misplaced first copy.  Copies mailed to patient, address verified.

## 2016-02-28 NOTE — Telephone Encounter (Signed)
Agree with recommendations. Continue current cardiac Rx.   Peter SwazilandJordan MD, [facility]

## 2016-03-01 NOTE — Telephone Encounter (Signed)
Spoke to patient.Dr.Jordan agreed with recommendations.Stated she was feeling better since she decreased losartan to 50 mg daily.Stated she continues to have a cough,couging yellow phlegm.Stated she will be seeing PCP in a couple of weeks about cough.Advised to keep appointment with Corine ShelterLuke Kilroy PA 03/28/16 at 1:30 pm.

## 2016-03-22 LAB — TSH: TSH: 2.55 mIU/L

## 2016-03-23 LAB — COMPREHENSIVE METABOLIC PANEL
ALT: 20 U/L (ref 6–29)
AST: 21 U/L (ref 10–35)
Albumin: 4.1 g/dL (ref 3.6–5.1)
Alkaline Phosphatase: 70 U/L (ref 33–130)
BUN: 16 mg/dL (ref 7–25)
CO2: 25 mmol/L (ref 20–31)
Calcium: 8.8 mg/dL (ref 8.6–10.4)
Chloride: 95 mmol/L — ABNORMAL LOW (ref 98–110)
Creat: 0.93 mg/dL — ABNORMAL HIGH (ref 0.60–0.88)
Glucose, Bld: 83 mg/dL (ref 65–99)
Potassium: 4 mmol/L (ref 3.5–5.3)
Sodium: 130 mmol/L — ABNORMAL LOW (ref 135–146)
Total Bilirubin: 0.4 mg/dL (ref 0.2–1.2)
Total Protein: 6.1 g/dL (ref 6.1–8.1)

## 2016-03-27 ENCOUNTER — Emergency Department (HOSPITAL_COMMUNITY): Payer: Medicare Other

## 2016-03-27 ENCOUNTER — Inpatient Hospital Stay (HOSPITAL_COMMUNITY)
Admission: EM | Admit: 2016-03-27 | Discharge: 2016-03-30 | DRG: 194 | Disposition: A | Discharge status: Home / Self Care | Payer: Medicare Other | Attending: Internal Medicine | Admitting: Internal Medicine

## 2016-03-27 ENCOUNTER — Encounter (HOSPITAL_COMMUNITY): Payer: Self-pay | Admitting: Emergency Medicine

## 2016-03-27 DIAGNOSIS — I480 Paroxysmal atrial fibrillation: Secondary | ICD-10-CM | POA: Diagnosis not present

## 2016-03-27 DIAGNOSIS — H409 Unspecified glaucoma: Secondary | ICD-10-CM | POA: Diagnosis present

## 2016-03-27 DIAGNOSIS — J189 Pneumonia, unspecified organism: Principal | ICD-10-CM | POA: Diagnosis present

## 2016-03-27 DIAGNOSIS — Z8673 Personal history of transient ischemic attack (TIA), and cerebral infarction without residual deficits: Secondary | ICD-10-CM | POA: Diagnosis not present

## 2016-03-27 DIAGNOSIS — I482 Chronic atrial fibrillation: Secondary | ICD-10-CM | POA: Diagnosis present

## 2016-03-27 DIAGNOSIS — I5032 Chronic diastolic (congestive) heart failure: Secondary | ICD-10-CM | POA: Diagnosis present

## 2016-03-27 DIAGNOSIS — R059 Cough, unspecified: Secondary | ICD-10-CM

## 2016-03-27 DIAGNOSIS — Z79899 Other long term (current) drug therapy: Secondary | ICD-10-CM | POA: Diagnosis not present

## 2016-03-27 DIAGNOSIS — R0902 Hypoxemia: Secondary | ICD-10-CM | POA: Diagnosis present

## 2016-03-27 DIAGNOSIS — R0600 Dyspnea, unspecified: Secondary | ICD-10-CM | POA: Diagnosis not present

## 2016-03-27 DIAGNOSIS — Z7901 Long term (current) use of anticoagulants: Secondary | ICD-10-CM

## 2016-03-27 DIAGNOSIS — Z8249 Family history of ischemic heart disease and other diseases of the circulatory system: Secondary | ICD-10-CM | POA: Diagnosis not present

## 2016-03-27 DIAGNOSIS — I081 Rheumatic disorders of both mitral and tricuspid valves: Secondary | ICD-10-CM | POA: Diagnosis present

## 2016-03-27 DIAGNOSIS — R05 Cough: Secondary | ICD-10-CM

## 2016-03-27 DIAGNOSIS — Z888 Allergy status to other drugs, medicaments and biological substances status: Secondary | ICD-10-CM | POA: Diagnosis not present

## 2016-03-27 DIAGNOSIS — I110 Hypertensive heart disease with heart failure: Secondary | ICD-10-CM | POA: Diagnosis present

## 2016-03-27 LAB — COMPREHENSIVE METABOLIC PANEL
ALT: 26 U/L (ref 14–54)
AST: 29 U/L (ref 15–41)
Albumin: 4.6 g/dL (ref 3.5–5.0)
Alkaline Phosphatase: 89 U/L (ref 38–126)
Anion gap: 9 (ref 5–15)
BUN: 16 mg/dL (ref 6–20)
CO2: 25 mmol/L (ref 22–32)
Calcium: 9.1 mg/dL (ref 8.9–10.3)
Chloride: 97 mmol/L — ABNORMAL LOW (ref 101–111)
Creatinine, Ser: 0.78 mg/dL (ref 0.44–1.00)
GFR calc Af Amer: >60 mL/min (ref >60)
GFR calc non Af Amer: >60 mL/min (ref >60)
Glucose, Bld: 106 mg/dL — ABNORMAL HIGH (ref 65–99)
Potassium: 3.9 mmol/L (ref 3.5–5.1)
Sodium: 131 mmol/L — ABNORMAL LOW (ref 135–145)
Total Bilirubin: 0.6 mg/dL (ref 0.3–1.2)
Total Protein: 7.3 g/dL (ref 6.5–8.1)

## 2016-03-27 LAB — URINALYSIS, ROUTINE W REFLEX MICROSCOPIC
Bilirubin Urine: NEGATIVE
Glucose, UA: NEGATIVE mg/dL
Ketones, ur: NEGATIVE mg/dL
Leukocytes, UA: NEGATIVE
Nitrite: NEGATIVE
Protein, ur: 30 mg/dL — AB
Specific Gravity, Urine: 1.01 (ref 1.005–1.030)
pH: 7.5 (ref 5.0–8.0)

## 2016-03-27 LAB — CBC WITH DIFFERENTIAL/PLATELET
Basophils Absolute: 0 K/uL (ref 0.0–0.1)
Basophils Relative: 0 %
Eosinophils Absolute: 0.2 K/uL (ref 0.0–0.7)
Eosinophils Relative: 2 %
HCT: 36.4 % (ref 36.0–46.0)
Hemoglobin: 12.6 g/dL (ref 12.0–15.0)
Lymphocytes Relative: 4 %
Lymphs Abs: 0.5 K/uL — ABNORMAL LOW (ref 0.7–4.0)
MCH: 31.5 pg (ref 26.0–34.0)
MCHC: 34.6 g/dL (ref 30.0–36.0)
MCV: 91 fL (ref 78.0–100.0)
Monocytes Absolute: 0.5 K/uL (ref 0.1–1.0)
Monocytes Relative: 5 %
Neutro Abs: 9.9 K/uL — ABNORMAL HIGH (ref 1.7–7.7)
Neutrophils Relative %: 89 %
Platelets: 213 K/uL (ref 150–400)
RBC: 4 MIL/uL (ref 3.87–5.11)
RDW: 12.5 % (ref 11.5–15.5)
WBC: 11.1 K/uL — ABNORMAL HIGH (ref 4.0–10.5)

## 2016-03-27 LAB — I-STAT CG4 LACTIC ACID, ED: Lactic Acid, Venous: 1.05 mmol/L (ref 0.5–1.9)

## 2016-03-27 LAB — I-STAT TROPONIN, ED: Troponin i, poc: 0.02 ng/mL (ref 0.00–0.08)

## 2016-03-27 LAB — URINE MICROSCOPIC-ADD ON

## 2016-03-27 LAB — BRAIN NATRIURETIC PEPTIDE: B Natriuretic Peptide: 181.2 pg/mL — ABNORMAL HIGH (ref 0.0–100.0)

## 2016-03-27 LAB — STREP PNEUMONIAE URINARY ANTIGEN: Strep Pneumo Urinary Antigen: NEGATIVE

## 2016-03-27 MED ORDER — ACETAMINOPHEN 650 MG RE SUPP: 650.0000 mg | Freq: Four times a day (QID) | RECTAL | Status: DC | PRN

## 2016-03-27 MED ORDER — RIVAROXABAN 20 MG PO TABS
20.0000 mg | ORAL_TABLET | Freq: Every day | ORAL | Status: DC
  Administered 2016-03-27: 20 mg via ORAL
  Filled 2016-03-27: qty 1

## 2016-03-27 MED ORDER — SODIUM CHLORIDE 0.9 % IV SOLN: 250.0000 mL | INTRAVENOUS | Status: DC | PRN

## 2016-03-27 MED ORDER — AZITHROMYCIN 250 MG PO TABS
500.0000 mg | ORAL_TABLET | Freq: Once | ORAL | Status: AC
  Administered 2016-03-27: 500 mg via ORAL
  Filled 2016-03-27: qty 2

## 2016-03-27 MED ORDER — DEXTROSE 5 % IV SOLN
1.0000 g | INTRAVENOUS | Status: DC
  Administered 2016-03-27 – 2016-03-30 (×4): 1 g via INTRAVENOUS
  Filled 2016-03-27 (×4): qty 10

## 2016-03-27 MED ORDER — LATANOPROST 0.005 % OP SOLN
1.0000 [drp] | Freq: Every day | OPHTHALMIC | Status: DC
  Administered 2016-03-27 – 2016-03-29 (×3): 1 [drp] via OPHTHALMIC
  Filled 2016-03-27: qty 2.5

## 2016-03-27 MED ORDER — SODIUM CHLORIDE 0.9% FLUSH: 3.0000 mL | INTRAVENOUS | Status: DC | PRN

## 2016-03-27 MED ORDER — ACETAMINOPHEN 325 MG PO TABS
650.0000 mg | ORAL_TABLET | Freq: Four times a day (QID) | ORAL | Status: DC | PRN
  Administered 2016-03-28: 650 mg via ORAL
  Filled 2016-03-27: qty 2

## 2016-03-27 MED ORDER — CALCIUM CARBONATE 1250 (500 CA) MG PO TABS
1.0000 | ORAL_TABLET | Freq: Every evening | ORAL | Status: DC
  Administered 2016-03-27 – 2016-03-29 (×3): 500 mg via ORAL
  Filled 2016-03-27 (×3): qty 1

## 2016-03-27 MED ORDER — FLUOROURACIL 5 % EX CREA: 1.0000 "application " | TOPICAL_CREAM | Freq: Two times a day (BID) | CUTANEOUS | Status: DC

## 2016-03-27 MED ORDER — ADULT MULTIVITAMIN W/MINERALS CH
1.0000 | ORAL_TABLET | Freq: Every evening | ORAL | Status: DC
  Administered 2016-03-27 – 2016-03-29 (×3): 1 via ORAL
  Filled 2016-03-27 (×3): qty 1

## 2016-03-27 MED ORDER — ONDANSETRON HCL 4 MG/2ML IJ SOLN: 4.0000 mg | Freq: Four times a day (QID) | INTRAMUSCULAR | Status: DC | PRN

## 2016-03-27 MED ORDER — TIMOLOL MALEATE 0.5 % OP SOLN
1.0000 [drp] | Freq: Two times a day (BID) | OPHTHALMIC | Status: DC
  Administered 2016-03-27 – 2016-03-30 (×7): 1 [drp] via OPHTHALMIC
  Filled 2016-03-27: qty 5

## 2016-03-27 MED ORDER — METOPROLOL SUCCINATE ER 25 MG PO TB24
25.0000 mg | ORAL_TABLET | Freq: Every day | ORAL | Status: DC
  Administered 2016-03-27 – 2016-03-30 (×4): 25 mg via ORAL
  Filled 2016-03-27 (×4): qty 1

## 2016-03-27 MED ORDER — VITAMIN C 500 MG PO TABS
500.0000 mg | ORAL_TABLET | Freq: Every day | ORAL | Status: DC
  Administered 2016-03-28 – 2016-03-30 (×3): 500 mg via ORAL
  Filled 2016-03-27 (×4): qty 1

## 2016-03-27 MED ORDER — DEXTROSE 5 % IV SOLN
500.0000 mg | INTRAVENOUS | Status: DC
  Administered 2016-03-28 – 2016-03-30 (×3): 500 mg via INTRAVENOUS
  Filled 2016-03-27 (×3): qty 500

## 2016-03-27 MED ORDER — SODIUM CHLORIDE 0.9% FLUSH
3.0000 mL | Freq: Two times a day (BID) | INTRAVENOUS | Status: DC
  Administered 2016-03-27 – 2016-03-30 (×5): 3 mL via INTRAVENOUS

## 2016-03-27 MED ORDER — LEVOFLOXACIN 750 MG PO TABS: 750.0000 mg | ORAL_TABLET | Freq: Once | ORAL | Status: DC

## 2016-03-27 MED ORDER — GUAIFENESIN-DM 100-10 MG/5ML PO SYRP
5.0000 mL | ORAL_SOLUTION | Freq: Four times a day (QID) | ORAL | Status: DC
  Administered 2016-03-27 – 2016-03-30 (×13): 5 mL via ORAL
  Filled 2016-03-27 (×14): qty 10

## 2016-03-27 MED ORDER — ALBUTEROL SULFATE (2.5 MG/3ML) 0.083% IN NEBU
5.0000 mg | INHALATION_SOLUTION | Freq: Once | RESPIRATORY_TRACT | Status: AC
Start: 2016-03-27 — End: 2016-03-27
  Administered 2016-03-27: 5 mg via RESPIRATORY_TRACT
  Filled 2016-03-27: qty 6

## 2016-03-27 MED ORDER — IPRATROPIUM-ALBUTEROL 0.5-2.5 (3) MG/3ML IN SOLN: 3.0000 mL | Freq: Four times a day (QID) | RESPIRATORY_TRACT | Status: DC

## 2016-03-27 MED ORDER — IPRATROPIUM-ALBUTEROL 0.5-2.5 (3) MG/3ML IN SOLN
3.0000 mL | Freq: Once | RESPIRATORY_TRACT | Status: AC
  Administered 2016-03-27: 3 mL via RESPIRATORY_TRACT
  Filled 2016-03-27: qty 3

## 2016-03-27 MED ORDER — AMIODARONE HCL 200 MG PO TABS
200.0000 mg | ORAL_TABLET | Freq: Every day | ORAL | Status: DC
  Administered 2016-03-27 – 2016-03-30 (×4): 200 mg via ORAL
  Filled 2016-03-27 (×4): qty 1

## 2016-03-27 MED ORDER — ONDANSETRON HCL 4 MG PO TABS: 4.0000 mg | ORAL_TABLET | Freq: Four times a day (QID) | ORAL | Status: DC | PRN

## 2016-03-27 MED ORDER — IPRATROPIUM-ALBUTEROL 0.5-2.5 (3) MG/3ML IN SOLN
3.0000 mL | Freq: Three times a day (TID) | RESPIRATORY_TRACT | Status: DC
Start: 2016-03-27 — End: 2016-03-28
  Administered 2016-03-27 – 2016-03-28 (×3): 3 mL via RESPIRATORY_TRACT
  Filled 2016-03-27 (×3): qty 3

## 2016-03-27 MED ORDER — IPRATROPIUM-ALBUTEROL 0.5-2.5 (3) MG/3ML IN SOLN
3.0000 mL | RESPIRATORY_TRACT | Status: DC | PRN
  Administered 2016-03-29: 3 mL via RESPIRATORY_TRACT
  Filled 2016-03-27: qty 3

## 2016-03-27 NOTE — Progress Notes (Signed)
Pt blood pressure is 193/83.  amniodarone and toprolo xl given, states she takes losatan at home.  MD made aware.  Will continue to monitor closely and follow out any new orders.

## 2016-03-27 NOTE — ED Notes (Signed)
Urine sample was not obtained when patient went to restroom. Patient will call out when ready to give sample.

## 2016-03-27 NOTE — Progress Notes (Signed)
Attempted to collect pertussis PCR.  Unable to due to not having the correct collection device in the hospital.  Lab and charge nurse made aware.  Will report to night shift.

## 2016-03-27 NOTE — ED Notes (Signed)
Bed: WA13 Expected date:  Expected time:  Means of arrival:  Comments: EMS 

## 2016-03-27 NOTE — H&P (Signed)
History and Physical    [name] Jones:[id number] DOB: 12/28/1923 DOA: 03/27/2016  PCP: Lorenda PeckOBERTS, RONALD WAYNE, MD   Patient coming from: Home  Chief Complaint: Cough and dyspnea.   HPI: [name] Jones is a 82 y.o. female with medical history significant of diastolic heart failure and atrial fibrillation, presents to hospital with the chief complaint of persistent and worsening cough. She does have chronic cough since November last year, but her cough has worsened over last 24 hours, being more persistent, severe, productive, no improving or worsening factors, associated with hoarseness but no fevers or chills. No chest pain, no angina, no PND or orthopnea. No recent vaccinations. She lives at home independently, but she has 24-hour sitters. Apparently her sitters had small children, but no evidence of sick contacts. Last night her cough was severe and she decided to come to emergency room for further evaluation. She has been prescribed a walker for ambulation, that she does not use all the time,  she is able to do her routine activities of daily living without significant problems including driving.  About a week ago she started herself on antibiotic therapy with ciprofloxacin suspecting she was suffering from a urinary tract infection.   ED Course: IV antibiotics and IV fluids.  Review of Systems: 1. General no fevers or chills 2. Cardiovascular no angina, claudication no PND or orthopnea 3. Pulmonary positive for cough and mild dyspnea, no wheezing or hemoptysis 4. Gastrointestinal no nausea, vomiting or diarrhea 5. Musculoskeletal no joint pain 6. Dermatology no rashes 7. Urology no dysuria or increased urinary frequency, one episode of hematuria 8. Endocrine no tremors heat or cold intolerance 9. Neurology no seizures or paresthesias 10. ENT no runny nose or sore throat   Past Medical History:  Diagnosis Date  . Atrial fibrillation (HCC)   . Chronic diastolic CHF  (congestive heart failure) (HCC)   . Glaucoma   . Hypertension   . Hyponatremia   . Iron deficiency anemia   . Mitral insufficiency   . TIA (transient ischemic attack) 1997  . Tricuspid insufficiency     Past Surgical History:  Procedure Laterality Date  . ABDOMINAL HYSTERECTOMY    . CARDIOVERSION N/A 09/15/2015   Procedure: CARDIOVERSION;  Surgeon: Wendall StadePeter C Nishan, MD;  [facility];  Service: Cardiovascular;  Laterality: N/A;  . CATARACT EXTRACTION    . DIAGNOSTIC LAPAROSCOPY    . STRABISMUS SURGERY    . TONSILLECTOMY       reports that she has never smoked. She has never used smokeless tobacco. She reports that she drinks alcohol. She reports that she does not use drugs.  Allergies  Allergen Reactions  . Lisinopril Cough    Family History  Problem Relation Age of Onset  . Heart disease Mother   . Ovarian cancer Mother [id number]  . Heart attack Father 176  . Heart attack Brother   . Heart attack Son 2652  . Hypertension Son      Prior to Admission medications   Medication Sig Start Date End Date Taking? Authorizing Provider  acetaminophen (TYLENOL) 325 MG tablet Take 650 mg by mouth at bedtime as needed for moderate pain.    Yes Historical Provider, MD  amiodarone (PACERONE) 200 MG tablet Take 1 tablet (200 mg total) by mouth daily. Patient taking differently: Take 200 mg by mouth daily with lunch.  10/28/15  Yes Peter M [place]Jordan, MD  calcium carbonate (OS-CAL - DOSED IN MG OF ELEMENTAL CALCIUM) 1250 (500 CA) MG  tablet Take 1 tablet by mouth every evening.    Yes Historical Provider, MD  carteolol (OCUPRESS) 1 % ophthalmic solution Place 1 drop into both eyes 2 (two) times daily.   Yes Historical Provider, MD  ciprofloxacin (CIPRO) 250 MG tablet Take 250 mg by mouth 2 (two) times daily.   Yes Historical Provider, MD  [employer] TUSSIN DM 100-10 MG/5ML liquid Take 5 mLs by mouth every 4 (four) hours as needed for cough.  06/10/15  Yes Historical Provider, MD  fluorouracil (EFUDEX)  5 % cream Apply 1 application topically 2 (two) times daily. Apply to legs; For 3 weeks 03/23/16-04/06/16 03/23/16  Yes Historical Provider, MD  furosemide (LASIX) 40 MG tablet Take 1 tablet (40 mg total) by mouth 2 (two) times daily. 10/28/15  Yes Peter M [place], MD  KLOR-CON M10 10 MEQ tablet TAKE 1 TABLET (10 MEQ TOTAL) BY MOUTH 2 (TWO) TIMES DAILY. 02/16/16  Yes Peter M [place], MD  latanoprost (XALATAN) 0.005 % ophthalmic solution Place 1 drop into both eyes at bedtime.   Yes Historical Provider, MD  losartan (COZAAR) 50 MG tablet Take 50 mg by mouth daily.   Yes Historical Provider, MD  metoprolol succinate (TOPROL-XL) 25 MG 24 hr tablet Take 1 tablet (25 mg total) by mouth daily. 01/16/16  Yes Peter M [place], MD  Multiple Vitamin (MULTIVITAMIN WITH MINERALS) TABS tablet Take 1 tablet by mouth every evening.    Yes Historical Provider, MD  rivaroxaban (XARELTO) 20 MG TABS tablet Take 1 tablet (20 mg total) by mouth daily with supper. 10/27/15  Yes Peter M [place], MD  vitamin C (ASCORBIC ACID) 500 MG tablet Take 500 mg by mouth daily with lunch.    Yes Historical Provider, MD  losartan (COZAAR) 100 MG tablet Take 1 tablet (100 mg total) by mouth daily. Patient not taking: Reported on 03/27/2016 12/23/15   Peter M [place], MD    Physical Exam: Vitals:   03/27/16 0924 03/27/16 0930 03/27/16 0945 03/27/16 1008  BP:  (!) 224/87 189/72   Pulse: 79 69 63 67  Resp:  11 11 18   Temp:      TempSrc:      SpO2: (!) 88% 99% 96% 98%      Constitutional: Deconditioned. Ill-looking appearing Vitals:   03/27/16 0924 03/27/16 0930 03/27/16 0945 03/27/16 1008  BP:  (!) 224/87 189/72   Pulse: 79 69 63 67  Resp:  11 11 18   Temp:      TempSrc:      SpO2: (!) 88% 99% 96% 98%   Eyes: PERRL, lids Normal , conjunctivae mild pallor but no icterus.  Nose and ears no deformities ENMT: Mucous membranes are dry. Posterior pharynx clear of any exudate or lesions.Normal dentition.  Neck: normal, supple, no masses, no  thyromegaly, positive lymphadenopathy in the right submandibular region, soft and mobile, tender to palpation. Respiratory: Poor ventilation, no wheezing, mild bibasilar crackles. Normal respiratory effort. No accessory muscle use. No stridor.  Cardiovascular: Regular rate and rhythm, no murmurs / rubs / gallops. No extremity edema. 2+ pedal pulses. No carotid bruits.  Abdomen: no tenderness, no masses palpated. No hepatosplenomegaly. Bowel sounds positive.  Musculoskeletal: no clubbing / cyanosis.  Good ROM, no contractures. Normal muscle tone. Hypertrophic distal interphalangeal joints.  Skin: no significant rashes, lesions, ulcers. No induration Neurologic: CN 2-12 grossly intact. Sensation intact, DTR normal. Strength 5/5 in all 4.    Labs on Admission: I have personally reviewed following labs and imaging studies  CBC:  Recent Labs Lab 03/27/16 0851  WBC 11.1*  NEUTROABS 9.9*  HGB 12.6  HCT 36.4  MCV 91.0  PLT 213   Basic Metabolic Panel:  Recent Labs Lab 03/22/16 1009 03/27/16 0851  NA 130* 131*  K 4.0 3.9  CL 95* 97*  CO2 25 25  GLUCOSE 83 106*  BUN 16 16  CREATININE 0.93* 0.78  CALCIUM 8.8 9.1   GFR: CrCl cannot be calculated (Unknown ideal weight.). Liver Function Tests:  Recent Labs Lab 03/22/16 1009 03/27/16 0851  AST 21 29  ALT 20 26  ALKPHOS 70 89  BILITOT 0.4 0.6  PROT 6.1 7.3  ALBUMIN 4.1 4.6   No results for input(s): LIPASE, AMYLASE in the last 168 hours. No results for input(s): AMMONIA in the last 168 hours. Coagulation Profile: No results for input(s): INR, PROTIME in the last 168 hours. Cardiac Enzymes: No results for input(s): CKTOTAL, CKMB, CKMBINDEX, TROPONINI in the last 168 hours. BNP (last 3 results) No results for input(s): PROBNP in the last 8760 hours. HbA1C: No results for input(s): HGBA1C in the last 72 hours. CBG: No results for input(s): GLUCAP in the last 168 hours. Lipid Profile: No results for input(s): CHOL, HDL,  LDLCALC, TRIG, CHOLHDL, LDLDIRECT in the last 72 hours. Thyroid Function Tests: No results for input(s): TSH, T4TOTAL, FREET4, T3FREE, THYROIDAB in the last 72 hours. Anemia Panel: No results for input(s): VITAMINB12, FOLATE, FERRITIN, TIBC, IRON, RETICCTPCT in the last 72 hours. Urine analysis:    Component Value Date/Time   COLORURINE YELLOW 11/02/2015 2233   APPEARANCEUR CLEAR 11/02/2015 2233   LABSPEC 1.011 11/02/2015 2233   PHURINE 6.0 11/02/2015 2233   GLUCOSEU NEGATIVE 11/02/2015 2233   HGBUR SMALL (A) 11/02/2015 2233   BILIRUBINUR NEGATIVE 11/02/2015 2233   KETONESUR NEGATIVE 11/02/2015 2233   PROTEINUR NEGATIVE 11/02/2015 2233   NITRITE NEGATIVE 11/02/2015 2233   LEUKOCYTESUR NEGATIVE 11/02/2015 2233   Sepsis Labs: !!!!!!!!!!!!!!!!!!!!!!!!!!!!!!!!!!!!!!!!!!!! @LABRCNTIP (procalcitonin:4,lacticidven:4) )No results found for this or any previous visit (from the past 240 hour(s)).   Radiological Exams on Admission: Dg Chest 2 View  Result Date: 03/27/2016 CLINICAL DATA:  Worsening cough, congestion, and wheezing since yesterday. EXAM: CHEST  2 VIEW COMPARISON:  CT of the chest 12/14/2015 FINDINGS: The heart size is normal. Chronic interstitial coarsening is evident. There is of linear atelectasis are present. There is blunting of costophrenic angles bilaterally. Asymmetric left lower lobe airspace disease is slightly increased and worrisome for early pneumonia. Atherosclerotic changes are present at the aortic arch. Degenerative changes and scoliosis are present in the thoracic spine without change. The visualized soft tissues and bony thorax are otherwise unremarkable. IMPRESSION: 1. Small left pleural effusion and airspace disease is worrisome for early pneumonia. 2. Stable chronic interstitial coarsening. 3. Atherosclerosis. Electronically Signed   By: Marin Roberts M.D.   On: 03/27/2016 08:28    EKG: Independently reviewed. Normal sinus rhythm, rate of 72 bpm, normal  axis, normal intervals.  Chest films. Personally review AP and lateral film. Well penetrated, well-inflated, no significant rotation. Increased lung markings bilaterally, very faint opacity at the left base, also a very small pleural effusion on the same side.  Assessment/Plan Active Problems:   CAP (community acquired pneumonia)   Pneumonia   This is a 82 year old female who comes from home, she does have chronic atrial fibrillation and diastolic heart failure. She presents with the acute worsening of cough, which has been productive and severe associated with hoarseness. No recent vaccinations. No evident sick  contacts. On initial physical examination her blood pressure is 193/83, heart rate 73, respiratory 20 -22, oxygen saturation 97-99% on room air. She does have a right submandibular lymphadenopathy. Lungs with bibasilar rales. Heart rhythmic S1-S2 present, no lower extremity edema. Sodium 131, potassium 3.9, creatinine 0.78, serum bicarbonate 25, glucose 106, white count 11.1, hematocrit is 36.4, hemoglobin 12.6. Her chest x-ray does have a very faint left lower lobe opacity with a small pleural effusion. Her EKG has normal intervals and is sinus rhythm.  Working diagnosis. Severe cough due to possible community acquired pneumonia to rule out pertussis infection.  1. Community acquired pneumonia. Will place patient on antibiotic therapy with ceftriaxone and azithromycin intravenously. The infiltrate on chest x-ray is very small, will repeat imaging the morning after hydration. Continue oximetry monitoring and submental oxygen per nasal cannula to target O2 saturation more than 92%. Will add bronchodilator therapy with DuoNeb every 6 hours and as needed q 4  Hours. Follow-up on blood cultures, cell count, temperature curve. Follow up on Legionella and pneumococcal antigens. Her predominant symptom is cough, will check on pertussis PCR and patient will be continued on macrolide therapy. Guaifenesin  DM every 6 hours for cough suppressant therapy.  2. Atrial fibrillation. Currently rate controlled, continue amiodarone 200 mg daily. She is on sinus rhythm with normal intervals including corrected QT. Continue metoprolol 25 g daily. Anticoagulation with rivaroxaban.  3. Glaucoma. Continue eyedrops  Patient continued to be at high risk of developing worsening cough and pneumonia.  DVT prophylaxis: xarelto Code Status: Full  Family Communication: I spoke with patient's companion at bedside and all questions were addressed.  Disposition Plan: Home  Consults called: none  Admission status: Inpatient    Remie Mathison Annett Gula MD [facility] Pager [phone]  If 7PM-7AM, please contact night-coverage [email or web address] Password [email or web address]  03/27/2016, 11:05 AM

## 2016-03-27 NOTE — ED Triage Notes (Signed)
Per EMS: Pt woke up yesterday feeling shob with cough.  Got worse overnight.  Audible wheezing upon EMS arrival.  Felt clammy to touch.  Denies chest pain, NV.  Has had 10 mg albuterol 0.5 mg atrovent, 125 mg solumedrol en route.  20 g in lt hand.  States this has happened once before last year when she had pna.

## 2016-03-27 NOTE — ED Provider Notes (Signed)
[facility]-EMERGENCY DEPT Provider Note   CSN: [id number] Arrival date & time: 03/27/16  7829     History   Chief Complaint Chief Complaint  Patient presents with  . Shortness of Breath  . Cough    HPI [name] Jones is a 82 y.o. female.   Shortness of Breath  This is a new problem. The problem occurs intermittently.The current episode started yesterday. The problem has been gradually worsening. Associated symptoms include cough and sputum production. Pertinent negatives include no fever, no chest pain, no syncope, no leg pain and no leg swelling. It is unknown what precipitated the problem. Risk factors: CHF. She has tried nothing for the symptoms. She has had no prior hospitalizations. She has had no prior ED visits. Associated medical issues include heart failure.  Cough  Associated symptoms include shortness of breath. Pertinent negatives include no chest pain.     Patient is a very pleasant 82 year old female presenting with cough and shortness of breath.  Past Medical History:  Diagnosis Date  . Atrial fibrillation (HCC)   . Chronic diastolic CHF (congestive heart failure) (HCC)   . Glaucoma   . Hypertension   . Hyponatremia   . Iron deficiency anemia   . Mitral insufficiency   . TIA (transient ischemic attack) 1997  . Tricuspid insufficiency     Patient Active Problem List   Diagnosis Date Noted  . Bradycardia by electrocardiogram 09/21/2015  . Chronic diastolic CHF (congestive heart failure) (HCC) 09/05/2015  . Atrial fibrillation (HCC) 09/05/2015  . HTN (hypertension) 09/05/2015  . Hyponatremia 09/05/2015    Past Surgical History:  Procedure Laterality Date  . ABDOMINAL HYSTERECTOMY    . CARDIOVERSION N/A 09/15/2015   Procedure: CARDIOVERSION;  Surgeon: Wendall Stade, MD;  Location: [facility] ENDOSCOPY;  Service: Cardiovascular;  Laterality: N/A;  . CATARACT EXTRACTION    . DIAGNOSTIC LAPAROSCOPY    . STRABISMUS SURGERY    . TONSILLECTOMY      OB History    No data available       Home Medications    Prior to Admission medications   Medication Sig Start Date End Date Taking? Authorizing Provider  acetaminophen (TYLENOL) 325 MG tablet Take 650 mg by mouth at bedtime as needed for moderate pain.    Yes Historical Provider, MD  amiodarone (PACERONE) 200 MG tablet Take 1 tablet (200 mg total) by mouth daily. Patient taking differently: Take 200 mg by mouth daily with lunch.  10/28/15  Yes Peter M [place], MD  calcium carbonate (OS-CAL - DOSED IN MG OF ELEMENTAL CALCIUM) 1250 (500 CA) MG tablet Take 1 tablet by mouth every evening.    Yes Historical Provider, MD  carteolol (OCUPRESS) 1 % ophthalmic solution Place 1 drop into both eyes 2 (two) times daily.   Yes Historical Provider, MD  ciprofloxacin (CIPRO) 250 MG tablet Take 250 mg by mouth 2 (two) times daily.   Yes Historical Provider, MD  [employer] TUSSIN DM 100-10 MG/5ML liquid Take 5 mLs by mouth every 4 (four) hours as needed for cough.  06/10/15  Yes Historical Provider, MD  fluorouracil (EFUDEX) 5 % cream Apply 1 application topically 2 (two) times daily. Apply to legs; For 3 weeks 03/23/16-04/06/16 03/23/16  Yes Historical Provider, MD  furosemide (LASIX) 40 MG tablet Take 1 tablet (40 mg total) by mouth 2 (two) times daily. 10/28/15  Yes Peter M [place], MD  KLOR-CON M10 10 MEQ tablet TAKE 1 TABLET (10 MEQ TOTAL) BY MOUTH 2 (TWO) TIMES DAILY.  02/16/16  Yes Peter M [place], MD  latanoprost (XALATAN) 0.005 % ophthalmic solution Place 1 drop into both eyes at bedtime.   Yes Historical Provider, MD  losartan (COZAAR) 50 MG tablet Take 50 mg by mouth daily.   Yes Historical Provider, MD  metoprolol succinate (TOPROL-XL) 25 MG 24 hr tablet Take 1 tablet (25 mg total) by mouth daily. 01/16/16  Yes Peter M [place], MD  Multiple Vitamin (MULTIVITAMIN WITH MINERALS) TABS tablet Take 1 tablet by mouth every evening.    Yes Historical Provider, MD  rivaroxaban (XARELTO) 20 MG TABS tablet Take 1 tablet (20 mg total) by  mouth daily with supper. 10/27/15  Yes Peter M [place], MD  vitamin C (ASCORBIC ACID) 500 MG tablet Take 500 mg by mouth daily with lunch.    Yes Historical Provider, MD  losartan (COZAAR) 100 MG tablet Take 1 tablet (100 mg total) by mouth daily. Patient not taking: Reported on 03/27/2016 12/23/15   Peter M [place], MD    Family History Family History  Problem Relation Age of Onset  . Heart disease Mother   . Ovarian cancer Mother 26  . Heart attack Father 73  . Heart attack Brother   . Heart attack Son 50  . Hypertension Son     Social History Social History  Substance Use Topics  . Smoking status: Never Smoker  . Smokeless tobacco: Never Used  . Alcohol use 0.0 oz/week     Comment: occasional     Allergies   Lisinopril   Review of Systems Review of Systems  Constitutional: Negative for fever.  Respiratory: Positive for cough, sputum production and shortness of breath.   Cardiovascular: Negative for chest pain, leg swelling and syncope.  All other systems reviewed and are negative.    Physical Exam Updated Vital Signs BP 189/72   Pulse 63   Temp 97.9 F (36.6 C) (Oral)   Resp 11   SpO2 96%   Physical Exam  Constitutional: She is oriented to person, place, and time. She appears well-developed and well-nourished.  HENT:  Head: Normocephalic and atraumatic.  Eyes: Right eye exhibits no discharge.  Cardiovascular: Normal rate and normal heart sounds.   No murmur heard. Pulmonary/Chest: No respiratory distress. She has wheezes. She has rales.  Abdominal: Soft. She exhibits no distension. There is no tenderness.  Neurological: She is oriented to person, place, and time.  Skin: Skin is warm and dry. She is not diaphoretic.  Psychiatric: She has a normal mood and affect.  Nursing note and vitals reviewed.    ED Treatments / Results  Labs (all labs ordered are listed, but only abnormal results are displayed) Labs Reviewed  COMPREHENSIVE METABOLIC PANEL -  Abnormal; Notable for the following:       Result Value   Sodium 131 (*)    Chloride 97 (*)    Glucose, Bld 106 (*)    All other components within normal limits  CBC WITH DIFFERENTIAL/PLATELET - Abnormal; Notable for the following:    WBC 11.1 (*)    Neutro Abs 9.9 (*)    Lymphs Abs 0.5 (*)    All other components within normal limits  BRAIN NATRIURETIC PEPTIDE - Abnormal; Notable for the following:    B Natriuretic Peptide 181.2 (*)    All other components within normal limits  URINE CULTURE  URINALYSIS, ROUTINE W REFLEX MICROSCOPIC (NOT AT [facility])  I-STAT TROPOININ, ED  I-STAT CG4 LACTIC ACID, ED    EKG  EKG Interpretation  Date/Time:  Tuesday March 27 2016 07:48:14 EDT Ventricular Rate:  72 PR Interval:    QRS Duration: 89 QT Interval:  408 QTC Calculation: 447 R Axis:   77 Text Interpretation:  Sinus rhythm No significant change since last tracing Confirmed by Kandis MannanMACKUEN, COURTNEY ([id number]) on 03/27/2016 9:18:35 AM       Radiology Dg Chest 2 View  Result Date: 03/27/2016 CLINICAL DATA:  Worsening cough, congestion, and wheezing since yesterday. EXAM: CHEST  2 VIEW COMPARISON:  CT of the chest 12/14/2015 FINDINGS: The heart size is normal. Chronic interstitial coarsening is evident. There is of linear atelectasis are present. There is blunting of costophrenic angles bilaterally. Asymmetric left lower lobe airspace disease is slightly increased and worrisome for early pneumonia. Atherosclerotic changes are present at the aortic arch. Degenerative changes and scoliosis are present in the thoracic spine without change. The visualized soft tissues and bony thorax are otherwise unremarkable. IMPRESSION: 1. Small left pleural effusion and airspace disease is worrisome for early pneumonia. 2. Stable chronic interstitial coarsening. 3. Atherosclerosis. Electronically Signed   By: Marin Robertshristopher  Mattern M.D.   On: 03/27/2016 08:28    Procedures Procedures (including critical care  time)  Medications Ordered in ED Medications  ipratropium-albuterol (DUONEB) 0.5-2.5 (3) MG/3ML nebulizer solution 3 mL (not administered)  albuterol (PROVENTIL) (2.5 MG/3ML) 0.083% nebulizer solution 5 mg (5 mg Nebulization Given 03/27/16 0757)  azithromycin (ZITHROMAX) tablet 500 mg (500 mg Oral Given 03/27/16 0926)     Initial Impression / Assessment and Plan / ED Course  I have reviewed the triage vital signs and the nursing notes.  Pertinent labs & imaging results that were available during my care of the patient were reviewed by me and considered in my medical decision making (see chart for details).  Clinical Course   Patient is a pleasant 82 year old female with history of hypertension, CHF presenting today with cough and shortness of breath. Patient reports that since yesterday she started feeling increasing cough, this is similar to the last time she had pneumonia. On chest x-ray she has pneumonia. Patient has not had recent hospitalization.     Daughter at bedside-  Apparently Patient was taking Cipro this week for a UTI. She was self treating. I will therefore treat with azithromycin, not levofloxacin given that she developed pneumonia while on this class of abx. However, this means she essentially has failed out patient treatment.      Patient does have hypertension but has not taken her her morning medications.  Patient working to breathe and does have barking cough, solumedrol given pta. Patient hypoxic and wheezing on mabulatmion.  Given age, and hypoxia with ambulation and failutre of outaptient treatment, will admit.     Final Clinical Impressions(s) / ED Diagnoses   Final diagnoses:  None    New Prescriptions New Prescriptions   No medications on file     Ikechukwu Cerny Randall AnLyn Cru Kritikos, MD 03/27/16 [phone]

## 2016-03-28 ENCOUNTER — Ambulatory Visit: Payer: Medicare Other | Admitting: Cardiology

## 2016-03-28 ENCOUNTER — Inpatient Hospital Stay (HOSPITAL_COMMUNITY): Payer: Medicare Other

## 2016-03-28 LAB — CBC
HCT: 32.9 % — ABNORMAL LOW (ref 36.0–46.0)
Hemoglobin: 11.7 g/dL — ABNORMAL LOW (ref 12.0–15.0)
MCH: 31.3 pg (ref 26.0–34.0)
MCHC: 35.6 g/dL (ref 30.0–36.0)
MCV: 88 fL (ref 78.0–100.0)
Platelets: 232 10*3/uL (ref 150–400)
RBC: 3.74 MIL/uL — ABNORMAL LOW (ref 3.87–5.11)
RDW: 12.5 % (ref 11.5–15.5)
WBC: 14.2 10*3/uL — ABNORMAL HIGH (ref 4.0–10.5)

## 2016-03-28 LAB — COMPREHENSIVE METABOLIC PANEL
ALT: 29 U/L (ref 14–54)
AST: 31 U/L (ref 15–41)
Albumin: 4.3 g/dL (ref 3.5–5.0)
Alkaline Phosphatase: 75 U/L (ref 38–126)
Anion gap: 10 (ref 5–15)
BUN: 18 mg/dL (ref 6–20)
CO2: 25 mmol/L (ref 22–32)
Calcium: 9.3 mg/dL (ref 8.9–10.3)
Chloride: 96 mmol/L — ABNORMAL LOW (ref 101–111)
Creatinine, Ser: 0.77 mg/dL (ref 0.44–1.00)
GFR calc Af Amer: >60 mL/min (ref >60)
GFR calc non Af Amer: >60 mL/min (ref >60)
Glucose, Bld: 113 mg/dL — ABNORMAL HIGH (ref 65–99)
Potassium: 4.1 mmol/L (ref 3.5–5.1)
Sodium: 131 mmol/L — ABNORMAL LOW (ref 135–145)
Total Bilirubin: 0.5 mg/dL (ref 0.3–1.2)
Total Protein: 7 g/dL (ref 6.5–8.1)

## 2016-03-28 LAB — URINE CULTURE: Culture: NO GROWTH

## 2016-03-28 LAB — EXPECTORATED SPUTUM ASSESSMENT W GRAM STAIN, RFLX TO RESP C: Special Requests: NORMAL

## 2016-03-28 LAB — LEGIONELLA PNEUMOPHILA SEROGP 1 UR AG: L. pneumophila Serogp 1 Ur Ag: NEGATIVE

## 2016-03-28 LAB — EXPECTORATED SPUTUM ASSESSMENT W REFEX TO RESP CULTURE

## 2016-03-28 MED ORDER — RIVAROXABAN 15 MG PO TABS
15.0000 mg | ORAL_TABLET | Freq: Every day | ORAL | Status: DC
  Administered 2016-03-28 – 2016-03-29 (×2): 15 mg via ORAL
  Filled 2016-03-28 (×2): qty 1

## 2016-03-28 MED ORDER — IPRATROPIUM-ALBUTEROL 0.5-2.5 (3) MG/3ML IN SOLN
3.0000 mL | Freq: Two times a day (BID) | RESPIRATORY_TRACT | Status: DC
  Administered 2016-03-28 – 2016-03-30 (×4): 3 mL via RESPIRATORY_TRACT
  Filled 2016-03-28 (×4): qty 3

## 2016-03-28 NOTE — Progress Notes (Signed)
Patient ID: [name] Octavelizabeth W Coull, female   DOB: [birth year], 82 y.o.   MRN: [id number]    PROGRESS NOTE    [name] Jones  WUX:[id number] DOB: [birth year] DOA: 03/27/2016  PCP: Lorenda PeckOBERTS, RONALD WAYNE, MD   Brief Narrative:  82 y.o. female with medical history significant for diastolic heart failure and atrial fibrillation, presented to hospital with the chief complaint of persistent and worsening productive cough of yellow sputum, subjective fevers and chills.   Assessment & Plan:  LLL PNA, CAP, unknown pathogen - improving on IV Zithromax and Rocephin, today is day #2 of ABX - it would be premature to change to PO ABX yet as pt still with significant productive cough and rhonchi at the LLL area and clinical deterioration is expected if IV ABX not continued  - will also provide BD's as needed, antitussives to help with coughing spells - reassess in AM and transition to oral ABX if appropriate   Chronic a-fib - rate controlled - continue Xarelto, metoprolol, amiodarone   Glaucoma - continue home eye drops   DVT prophylaxis: SCD's Code Status: Full  Family Communication: Patient at bedside  Disposition Plan: Home in 1- 2 days once of IV ABX  Consultants:   None  Procedures:   None  Antimicrobials:   Zithromax 9/5 -->  Rocephin 9/5 -->   Subjective: Reports feeling better but still significant cough.   Objective: Vitals:   03/28/16 0827 03/28/16 0832 03/28/16 0837 03/28/16 1453  BP: (!) 147/57   (!) 118/50  Pulse:    62  Resp:    20  Temp:    97.6 F (36.4 C)  TempSrc:    Oral  SpO2:  95% 95% 98%  Weight:      Height:        Intake/Output Summary (Last 24 hours) at 03/28/16 1615 Last data filed at 03/28/16 1400  Gross per 24 hour  Intake             1533 ml  Output              600 ml  Net              933 ml   Filed Weights   03/27/16 1800  Weight: 52.6 kg (115 lb 15.4 oz)    Examination:  General exam: Appears calm and comfortable  Respiratory  system: diminished breath sounds at bases especially left lower area with rhonchi Cardiovascular system: IRRR, No JVD, rubs, gallops or clicks. No pedal edema. Gastrointestinal system: Abdomen is nondistended, soft and nontender. No organomegaly or masses felt.  Central nervous system: Alert and oriented. No focal neurological deficits. Extremities: Symmetric 5 x 5 power.  Data Reviewed: I have personally reviewed following labs and imaging studies  CBC:  Recent Labs Lab 03/27/16 0851 03/28/16 0524  WBC 11.1* 14.2*  NEUTROABS 9.9*  --   HGB 12.6 11.7*  HCT 36.4 32.9*  MCV 91.0 88.0  PLT 213 232   Basic Metabolic Panel:  Recent Labs Lab 03/22/16 1009 03/27/16 0851 03/28/16 0524  NA 130* 131* 131*  K 4.0 3.9 4.1  CL 95* 97* 96*  CO2 25 25 25   GLUCOSE 83 106* 113*  BUN 16 16 18   CREATININE 0.93* 0.78 0.77  CALCIUM 8.8 9.1 9.3   Liver Function Tests:  Recent Labs Lab 03/22/16 1009 03/27/16 0851 03/28/16 0524  AST 21 29 31   ALT 20 26 29   ALKPHOS 70 89 75  BILITOT 0.4 0.6 0.5  PROT 6.1 7.3 7.0  ALBUMIN 4.1 4.6 4.3   Urine analysis:    Component Value Date/Time   COLORURINE YELLOW 03/27/2016 0835   APPEARANCEUR CLEAR 03/27/2016 0835   LABSPEC 1.010 03/27/2016 0835   PHURINE 7.5 03/27/2016 0835   GLUCOSEU NEGATIVE 03/27/2016 0835   HGBUR SMALL (A) 03/27/2016 0835   BILIRUBINUR NEGATIVE 03/27/2016 0835   KETONESUR NEGATIVE 03/27/2016 0835   PROTEINUR 30 (A) 03/27/2016 0835   NITRITE NEGATIVE 03/27/2016 0835   LEUKOCYTESUR NEGATIVE 03/27/2016 0835   Recent Results (from the past 240 hour(s))  Urine culture     Status: None   Collection Time: 03/27/16  8:35 AM  Result Value Ref Range Status   Specimen Description URINE, RANDOM  Final   Special Requests NONE  Final   Culture NO GROWTH Performed at [facility]   Final   Report Status 03/28/2016 FINAL  Final  Culture, blood (routine x 2) Call MD if unable to obtain prior to antibiotics being given      Status: None (Preliminary result)   Collection Time: 03/27/16 12:23 PM  Result Value Ref Range Status   Specimen Description BLOOD RIGHT ARM  Final   Special Requests BOTTLES DRAWN AEROBIC AND ANAEROBIC  10CC  Final   Culture   Final    NO GROWTH 1 DAY Performed at [facility]    Report Status PENDING  Incomplete  Culture, blood (routine x 2) Call MD if unable to obtain prior to antibiotics being given     Status: None (Preliminary result)   Collection Time: 03/27/16 12:24 PM  Result Value Ref Range Status   Specimen Description BLOOD RIGHT ARM  Final   Special Requests BOTTLES DRAWN AEROBIC AND ANAEROBIC  10CC  Final   Culture   Final    NO GROWTH 1 DAY Performed at [facility]    Report Status PENDING  Incomplete  Culture, sputum-assessment     Status: None   Collection Time: 03/28/16  6:05 AM  Result Value Ref Range Status   Specimen Description SPUTUM  Final   Special Requests Normal  Final   Sputum evaluation   Final    MICROSCOPIC FINDINGS SUGGEST THAT THIS SPECIMEN IS NOT REPRESENTATIVE OF LOWER RESPIRATORY SECRETIONS. PLEASE RECOLLECT. NOTIFIED Samantha Crimes RN AT 1191 ON 09.06.17 BY SHUEA    Report Status 03/28/2016 FINAL  Final      Radiology Studies: Dg Chest 2 View  Result Date: 03/28/2016 CLINICAL DATA:  Cough and congestion.  Medicated hypertension. EXAM: CHEST  2 VIEW COMPARISON:  03/27/2016; 11/02/2015; 06/06/2015; chest CT - 12/14/2015 FINDINGS: Grossly unchanged cardiac silhouette and mediastinal contours. Atherosclerotic plaque within the thoracic aorta. The lungs remain hyperexpanded with flattening of the bilateral hemidiaphragms. Chronic trace left-sided pleural effusion, similar to the 10/2015 examination. No focal airspace opacities. No pleural effusion or pneumothorax. No evidence of edema. No acute osseus abnormalities. IMPRESSION: 1. Hyperexpanded lungs without acute cardiopulmonary disease. 2. Chronic trace left-sided effusion.  No  evidence of edema. 3.  Aortic Atherosclerosis (ICD10-170.0) Electronically Signed   By: Simonne Come M.D.   On: 03/28/2016 08:19   Dg Chest 2 View  Result Date: 03/27/2016 CLINICAL DATA:  Worsening cough, congestion, and wheezing since yesterday. EXAM: CHEST  2 VIEW COMPARISON:  CT of the chest 12/14/2015 FINDINGS: The heart size is normal. Chronic interstitial coarsening is evident. There is of linear atelectasis are present. There is blunting of costophrenic angles bilaterally. Asymmetric left lower lobe airspace disease is  slightly increased and worrisome for early pneumonia. Atherosclerotic changes are present at the aortic arch. Degenerative changes and scoliosis are present in the thoracic spine without change. The visualized soft tissues and bony thorax are otherwise unremarkable. IMPRESSION: 1. Small left pleural effusion and airspace disease is worrisome for early pneumonia. 2. Stable chronic interstitial coarsening. 3. Atherosclerosis. Electronically Signed   By: Marin Roberts M.D.   On: 03/27/2016 08:28      Scheduled Meds: . amiodarone  200 mg Oral Q lunch  . azithromycin  500 mg Intravenous Q24H  . calcium carbonate  1 tablet Oral QPM  . cefTRIAXone (ROCEPHIN)  IV  1 g Intravenous Q24H  . guaiFENesin-dextromethorphan  5 mL Oral Q6H  . ipratropium-albuterol  3 mL Nebulization BID  . latanoprost  1 drop Both Eyes QHS  . metoprolol succinate  25 mg Oral Daily  . multivitamin with minerals  1 tablet Oral QPM  . rivaroxaban  15 mg Oral Q supper  . sodium chloride flush  3 mL Intravenous Q12H  . timolol  1 drop Both Eyes BID  . vitamin C  500 mg Oral Q lunch   Continuous Infusions:    LOS: 1 day    Time spent: 20 minutes    Debbora Presto, MD [facility] Pager [phone]  If 7PM-7AM, please contact night-coverage [email or web address] Password [email or web address] 03/28/2016, 4:15 PM

## 2016-03-29 LAB — CBC
HCT: 30 % — ABNORMAL LOW (ref 36.0–46.0)
Hemoglobin: 10.5 g/dL — ABNORMAL LOW (ref 12.0–15.0)
MCH: 31.3 pg (ref 26.0–34.0)
MCHC: 35 g/dL (ref 30.0–36.0)
MCV: 89.3 fL (ref 78.0–100.0)
Platelets: 183 10*3/uL (ref 150–400)
RBC: 3.36 MIL/uL — ABNORMAL LOW (ref 3.87–5.11)
RDW: 12.7 % (ref 11.5–15.5)
WBC: 10.5 10*3/uL (ref 4.0–10.5)

## 2016-03-29 LAB — BASIC METABOLIC PANEL
Anion gap: 7 (ref 5–15)
BUN: 18 mg/dL (ref 6–20)
CO2: 27 mmol/L (ref 22–32)
Calcium: 8.8 mg/dL — ABNORMAL LOW (ref 8.9–10.3)
Chloride: 99 mmol/L — ABNORMAL LOW (ref 101–111)
Creatinine, Ser: 0.89 mg/dL (ref 0.44–1.00)
GFR calc Af Amer: >60 mL/min (ref >60)
GFR calc non Af Amer: 55 mL/min — ABNORMAL LOW (ref >60)
Glucose, Bld: 93 mg/dL (ref 65–99)
Potassium: 4.1 mmol/L (ref 3.5–5.1)
Sodium: 133 mmol/L — ABNORMAL LOW (ref 135–145)

## 2016-03-29 LAB — BORDETELLA PERTUSSIS PCR
B parapertussis, DNA: NEGATIVE
B pertussis, DNA: NEGATIVE

## 2016-03-29 NOTE — Evaluation (Addendum)
Physical Therapy Evaluation-1x Patient Details Name: [name] Jones MRN: [id number] DOB: Nov 27, 1923 Today's Date: 03/29/2016   History of Present Illness  82 yo female admitted with Pna  Clinical Impression  On eval, pt was supervision level assist for mobility. She walked ~160 feet in hallway. O2 sats 98% on RA. Do not feel pt has any follow up PT needs-pt agrees with this as well. Recommend daily ambulation in hallway with nursing supervision. 1x eval. Encouraged pt to use her rollator for ambulation safety if/when needed-pt agreeable.     Follow Up Recommendations No PT follow up    Equipment Recommendations  None recommended by PT    Recommendations for Other Services       Precautions / Restrictions Precautions Precautions: Fall Restrictions Weight Bearing Restrictions: No      Mobility  Bed Mobility Overal bed mobility: Needs Assistance Bed Mobility: Supine to Sit     Supine to sit: Modified independent (Device/Increase time)        Transfers Overall transfer level: Needs assistance   Transfers: Sit to/from Stand Sit to Stand: Modified independent (Device/Increase time)            Ambulation/Gait Ambulation/Gait assistance: Supervision Ambulation Distance (Feet): 160 Feet Assistive device: None (pt pushed dynamap) Gait Pattern/deviations: Step-through pattern     General Gait Details: slow gait speed. Pt tolerated distance well.   Stairs            Wheelchair Mobility    Modified Rankin (Stroke Patients Only)       Balance                                             Pertinent Vitals/Pain Pain Assessment: No/denies pain    Home Living Family/patient expects to be discharged to:: Private residence Living Arrangements: Spouse/significant other Available Help at Discharge: Personal care attendant (mostly for pt's husband) Type of Home: House Home Access: Stairs to enter   Entergy CorporationEntrance Stairs-Number of Steps:  1 Home Layout: One level Home Equipment: Walker - 4 wheels;Walker - 2 wheels;Cane - single point      Prior Function           Comments: 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 Drives, cooks. Hires cleaning lady. uses rollator as needed but not always     Hand Dominance        Extremity/Trunk Assessment   Upper Extremity Assessment: Overall WFL for tasks assessed           Lower Extremity Assessment: Generalized weakness      Cervical / Trunk Assessment: Normal  Communication   Communication: No difficulties  Cognition Arousal/Alertness: Awake/Jones Behavior During Therapy: WFL for tasks assessed/performed Overall Cognitive Status: Within Functional Limits for tasks assessed                      General Comments      Exercises        Assessment/Plan    PT Assessment Patent does not need any further PT services (Recommend daily ambulation in hallway with nursing supervision)  PT Diagnosis     PT Problem List    PT Treatment Interventions     PT Goals (Current goals can be found in the Care Plan section) Acute Rehab PT Goals Patient Stated Goal: home soon. regain independence PT Goal  Formulation: All assessment and education complete, DC therapy    Frequency     Barriers to discharge        Co-evaluation               End of Session   Activity Tolerance: Patient tolerated treatment well Patient left: in bed;with call bell/phone within reach           Time: 6295-2841 PT Time Calculation (min) (ACUTE ONLY): 19 min   Charges:   PT Evaluation $PT Eval Low Complexity: 1 Procedure     PT G Codes:        [name] Jones, MPT Pager: [phone]

## 2016-03-29 NOTE — Plan of Care (Signed)
Problem: Pain Managment: Goal: General experience of comfort will improve Outcome: Completed/Met Date Met: 03/29/16 Denies pain.  Problem: Physical Regulation: Goal: Will remain free from infection Continue   Problem: Activity: Goal: Risk for activity intolerance will decrease Continue.  Up with stand by assist to bathroom

## 2016-03-29 NOTE — Progress Notes (Addendum)
Patient ID: [name] Jones, female   DOB: 1923-10-13, 82 y.o.   MRN: [id number]    PROGRESS NOTE    NIYA BEHLER  WUJ:[id number] DOB: 15-Apr-1924 DOA: 03/27/2016  PCP: Lorenda Peck, MD   Brief Narrative:  82 y.o. female with medical history significant for diastolic heart failure and atrial fibrillation, presented to hospital with the chief complaint of persistent and worsening productive cough of yellow sputum, subjective fevers and chills.   Assessment & Plan:  LLL PNA, CAP, unknown pathogen - improving on IV Zithromax and Rocephin, today is day #3 of ABX - pt appears to be improving but still with rather uncomfortable productive cough  - will continue to provide BD's as needed, antitussives to help with coughing spells - will change to PO levaqiun, continue ABX for total 7 days, stop date Sept 11th, 2017   Chronic a-fib - rate controlled - continue Xarelto, metoprolol, amiodarone  - please note that dose of Xarelto was readjusted to 15 mg PO QD based on age, weight, renal function   Glaucoma - continue home eye drops   DVT prophylaxis: SCD's Code Status: Full  Family Communication: Patient at bedside  Disposition Plan: Home in [place] if continues to improve of PO ABX  Consultants:   None  Procedures:   None  Antimicrobials:   Zithromax 9/5 --> 9/7  Rocephin 9/5 --> 9/7  Levaquin 9/7 --> 9/11   Subjective: Reports feeling better but still significant cough.   Objective: Vitals:   03/29/16 0520 03/29/16 1019 03/29/16 1026 03/29/16 1345  BP: (!) 171/58 (!) 155/44  (!) 118/43  Pulse: 66 61 67 (!) 58  Resp: 18  18 18   Temp: 99.8 F (37.7 C)   98 F (36.7 C)  TempSrc: Oral   Oral  SpO2: 97%  97% 100%  Weight:      Height:        Intake/Output Summary (Last 24 hours) at 03/29/16 1533 Last data filed at 03/29/16 1350  Gross per 24 hour  Intake              900 ml  Output             1251 ml  Net             -351 ml   Filed Weights   03/27/16 1800  Weight: 52.6 kg (115 lb 15.4 oz)    Examination:  General exam: Appears calm and comfortable  Respiratory system: diminished breath sounds at bases especially left lower area with rhonchi still present  Cardiovascular system: IRRR, No JVD, rubs, gallops or clicks. No pedal edema. Gastrointestinal system: Abdomen is nondistended, soft and nontender. No organomegaly or masses felt.  Central nervous system: Alert and oriented. No focal neurological deficits. Extremities: Symmetric 5 x 5 power.  Data Reviewed: I have personally reviewed following labs and imaging studies  CBC:  Recent Labs Lab 03/27/16 0851 03/28/16 0524 03/29/16 0458  WBC 11.1* 14.2* 10.5  NEUTROABS 9.9*  --   --   HGB 12.6 11.7* 10.5*  HCT 36.4 32.9* 30.0*  MCV 91.0 88.0 89.3  PLT 213 232 183   Basic Metabolic Panel:  Recent Labs Lab 03/27/16 0851 03/28/16 0524 03/29/16 0458  NA 131* 131* 133*  K 3.9 4.1 4.1  CL 97* 96* 99*  CO2 25 25 27   GLUCOSE 106* 113* 93  BUN 16 18 18   CREATININE 0.78 0.77 0.89  CALCIUM 9.1 9.3 8.8*   Liver Function Tests:  Recent Labs Lab 03/27/16 0851 03/28/16 0524  AST 29 31  ALT 26 29  ALKPHOS 89 75  BILITOT 0.6 0.5  PROT 7.3 7.0  ALBUMIN 4.6 4.3   Urine analysis:    Component Value Date/Time   COLORURINE YELLOW 03/27/2016 0835   APPEARANCEUR CLEAR 03/27/2016 0835   LABSPEC 1.010 03/27/2016 0835   PHURINE 7.5 03/27/2016 0835   GLUCOSEU NEGATIVE 03/27/2016 0835   HGBUR SMALL (A) 03/27/2016 0835   BILIRUBINUR NEGATIVE 03/27/2016 0835   KETONESUR NEGATIVE 03/27/2016 0835   PROTEINUR 30 (A) 03/27/2016 0835   NITRITE NEGATIVE 03/27/2016 0835   LEUKOCYTESUR NEGATIVE 03/27/2016 0835   Recent Results (from the past 240 hour(s))  Urine culture     Status: None   Collection Time: 03/27/16  8:35 AM  Result Value Ref Range Status   Specimen Description URINE, RANDOM  Final   Special Requests NONE  Final   Culture NO GROWTH Performed at [facility]   Final   Report Status 03/28/2016 FINAL  Final  Culture, blood (routine x 2) Call MD if unable to obtain prior to antibiotics being given     Status: None (Preliminary result)   Collection Time: 03/27/16 12:23 PM  Result Value Ref Range Status   Specimen Description BLOOD RIGHT ARM  Final   Special Requests BOTTLES DRAWN AEROBIC AND ANAEROBIC  10CC  Final   Culture   Final    NO GROWTH 2 DAYS Performed at [facility]    Report Status PENDING  Incomplete  Culture, blood (routine x 2) Call MD if unable to obtain prior to antibiotics being given     Status: None (Preliminary result)   Collection Time: 03/27/16 12:24 PM  Result Value Ref Range Status   Specimen Description BLOOD RIGHT ARM  Final   Special Requests BOTTLES DRAWN AEROBIC AND ANAEROBIC  10CC  Final   Culture   Final    NO GROWTH 2 DAYS Performed at [facility]    Report Status PENDING  Incomplete  Culture, sputum-assessment     Status: None   Collection Time: 03/28/16  6:05 AM  Result Value Ref Range Status   Specimen Description SPUTUM  Final   Special Requests Normal  Final   Sputum evaluation   Final    MICROSCOPIC FINDINGS SUGGEST THAT THIS SPECIMEN IS NOT REPRESENTATIVE OF LOWER RESPIRATORY SECRETIONS. PLEASE RECOLLECT. NOTIFIED Samantha Crimes RN AT 8119 ON 09.06.17 BY SHUEA    Report Status 03/28/2016 FINAL  Final      Radiology Studies: Dg Chest 2 View  Result Date: 03/28/2016 CLINICAL DATA:  Cough and congestion.  Medicated hypertension. EXAM: CHEST  2 VIEW COMPARISON:  03/27/2016; 11/02/2015; 06/06/2015; chest CT - 12/14/2015 FINDINGS: Grossly unchanged cardiac silhouette and mediastinal contours. Atherosclerotic plaque within the thoracic aorta. The lungs remain hyperexpanded with flattening of the bilateral hemidiaphragms. Chronic trace left-sided pleural effusion, similar to the 10/2015 examination. No focal airspace opacities. No pleural effusion or pneumothorax. No evidence of  edema. No acute osseus abnormalities. IMPRESSION: 1. Hyperexpanded lungs without acute cardiopulmonary disease. 2. Chronic trace left-sided effusion.  No evidence of edema. 3.  Aortic Atherosclerosis (ICD10-170.0) Electronically Signed   By: Simonne Come M.D.   On: 03/28/2016 08:19      Scheduled Meds: . amiodarone  200 mg Oral Q lunch  . azithromycin  500 mg Intravenous Q24H  . calcium carbonate  1 tablet Oral QPM  . cefTRIAXone (ROCEPHIN)  IV  1 g Intravenous  Q24H  . guaiFENesin-dextromethorphan  5 mL Oral Q6H  . ipratropium-albuterol  3 mL Nebulization BID  . latanoprost  1 drop Both Eyes QHS  . metoprolol succinate  25 mg Oral Daily  . multivitamin with minerals  1 tablet Oral QPM  . rivaroxaban  15 mg Oral Q supper  . sodium chloride flush  3 mL Intravenous Q12H  . timolol  1 drop Both Eyes BID  . vitamin C  500 mg Oral Q lunch   Continuous Infusions:    LOS: 2 days    Time spent: 20 minutes    Debbora PrestoMAGICK-MYERS, ISKRA, MD [facility] Pager [phone]  If 7PM-7AM, please contact night-coverage [email or web address] Password [email or web address] 03/29/2016, 3:33 PM

## 2016-03-30 DIAGNOSIS — J189 Pneumonia, unspecified organism: Principal | ICD-10-CM

## 2016-03-30 LAB — CBC
HCT: 31.2 % — ABNORMAL LOW (ref 36.0–46.0)
Hemoglobin: 10.9 g/dL — ABNORMAL LOW (ref 12.0–15.0)
MCH: 32.1 pg (ref 26.0–34.0)
MCHC: 34.9 g/dL (ref 30.0–36.0)
MCV: 91.8 fL (ref 78.0–100.0)
Platelets: 195 10*3/uL (ref 150–400)
RBC: 3.4 MIL/uL — ABNORMAL LOW (ref 3.87–5.11)
RDW: 12.7 % (ref 11.5–15.5)
WBC: 8.2 10*3/uL (ref 4.0–10.5)

## 2016-03-30 LAB — BASIC METABOLIC PANEL
Anion gap: 8 (ref 5–15)
BUN: 13 mg/dL (ref 6–20)
CO2: 27 mmol/L (ref 22–32)
Calcium: 8.7 mg/dL — ABNORMAL LOW (ref 8.9–10.3)
Chloride: 98 mmol/L — ABNORMAL LOW (ref 101–111)
Creatinine, Ser: 0.74 mg/dL (ref 0.44–1.00)
GFR calc Af Amer: >60 mL/min (ref >60)
GFR calc non Af Amer: >60 mL/min (ref >60)
Glucose, Bld: 90 mg/dL (ref 65–99)
Potassium: 4 mmol/L (ref 3.5–5.1)
Sodium: 133 mmol/L — ABNORMAL LOW (ref 135–145)

## 2016-03-30 MED ORDER — RIVAROXABAN 15 MG PO TABS: 15.0000 mg | ORAL_TABLET | Freq: Every day | ORAL | 3 refills | Status: DC

## 2016-03-30 MED ORDER — SACCHAROMYCES BOULARDII 250 MG PO CAPS: 250.0000 mg | ORAL_CAPSULE | Freq: Two times a day (BID) | ORAL | 0 refills | Status: DC

## 2016-03-30 MED ORDER — CEFPODOXIME PROXETIL 200 MG PO TABS: 200.0000 mg | ORAL_TABLET | Freq: Two times a day (BID) | ORAL | 0 refills | Status: DC

## 2016-03-30 MED ORDER — BENZONATATE 100 MG PO CAPS
100.0000 mg | ORAL_CAPSULE | Freq: Three times a day (TID) | ORAL | Status: DC | PRN
  Administered 2016-03-30: 100 mg via ORAL
  Filled 2016-03-30: qty 1

## 2016-03-30 NOTE — Discharge Summary (Signed)
Discharge Summary  [name] Jones:[id number] DOB: 29-Oct-1923  PCP: Lorenda Peck, MD  Admit date: 03/27/2016 Discharge date: 03/30/2016   Recommendations for Outpatient Follow-up:  1. PCP 2 weeks 2. Cardiology 2 weeks   Discharge Diagnoses:  Active Hospital Problems   Diagnosis Date Noted  . CAP (community acquired pneumonia) 03/27/2016  . Pneumonia 03/27/2016    Resolved Hospital Problems   Diagnosis Date Noted Date Resolved  No resolved problems to display.   Discharge Condition: Stable   Diet recommendation: Cardiac Diet   Vitals:   03/30/16 0604 03/30/16 0933  BP: (!) 175/70   Pulse: 64 83  Resp: 18   Temp: 98.5 F (36.9 C)    History of present illness:  82 y.o.femalewith medical history significant for diastolic heart failure and atrial fibrillation, presented to hospital with the chief complaint of persistent and worsening productive cough of yellow sputum, subjective fevers and chills.   Hospital Course:  LLL PNA, CAP, unknown pathogen - improving on IV Zithromax and Rocephin, discharge home today on room air, complete total 7 day course with oral Vantin - pt appears to be quite improved, she was seen by PT and they don't recommend any skilled therapy - she feels well and is agreeable to discharge home today  Chronic a-fib - rate controlled - continue Xarelto, metoprolol, amiodarone  - please note that dose of Xarelto was readjusted to 15 mg PO QD based on age, weight, renal function. Being discharged on this adjusted dose  Glaucoma - continue home eye drops   Procedures:  None   Consultations:  None   Discharge Exam: BP (!) 175/70 (BP Location: Left Arm)   Pulse 83   Temp 98.5 F (36.9 C) (Oral)   Resp 18   Ht 5' (1.524 m)   Wt 57.1 kg (125 lb 14.1 oz)   SpO2 98%   BMI 24.58 kg/m  General:  Alert, oriented, calm, in no acute distress  Eyes: pupils round and reactive to light and accomodation, clear sclerea Neck:  supple, no masses, trachea mildline  Cardiovascular: RRR, no murmurs or rubs, no peripheral edema  Respiratory: clear to auscultation bilaterally, no wheezes, no crackles  Abdomen: soft, nontender, nondistended, normal bowel tones heard  Skin: dry, no rashes  Musculoskeletal: no joint effusions, normal range of motion  Psychiatric: appropriate affect, normal speech  Neurologic: extraocular muscles intact, clear speech, moving all extremities with intact sensorium    Discharge Instructions You were cared for by a hospitalist during your hospital stay. If you have any questions about your discharge medications or the care you received while you were in the hospital after you are discharged, you can call the unit and asked to speak with the hospitalist on call if the hospitalist that took care of you is not available. Once you are discharged, your primary care physician will handle any further medical issues. Please note that NO REFILLS for any discharge medications will be authorized once you are discharged, as it is imperative that you return to your primary care physician (or establish a relationship with a primary care physician if you do not have one) for your aftercare needs so that they can reassess your need for medications and monitor your lab values.  Discharge Instructions    Diet - low sodium heart healthy    Complete by:  As directed   Increase activity slowly    Complete by:  As directed       Medication List  STOP taking these medications   ciprofloxacin 250 MG tablet Commonly known as:  CIPRO   losartan 100 MG tablet Commonly known as:  COZAAR   losartan 50 MG tablet Commonly known as:  COZAAR     TAKE these medications   acetaminophen 325 MG tablet Commonly known as:  TYLENOL Take 650 mg by mouth at bedtime as needed for moderate pain.   amiodarone 200 MG tablet Commonly known as:  PACERONE Take 1 tablet (200 mg total) by mouth daily. What changed:  when to  take this   calcium carbonate 1250 (500 Ca) MG tablet Commonly known as:  OS-CAL - dosed in mg of elemental calcium Take 1 tablet by mouth every evening.   carteolol 1 % ophthalmic solution Commonly known as:  OCUPRESS Place 1 drop into both eyes 2 (two) times daily.   cefpodoxime 200 MG tablet Commonly known as:  VANTIN Take 1 tablet (200 mg total) by mouth 2 (two) times daily.   [employer] TUSSIN DM 10-100 MG/5ML liquid Generic drug:  dextromethorphan-guaiFENesin Take 5 mLs by mouth every 4 (four) hours as needed for cough.   fluorouracil 5 % cream Commonly known as:  EFUDEX Apply 1 application topically 2 (two) times daily. Apply to legs; For 3 weeks 03/23/16-04/06/16   furosemide 40 MG tablet Commonly known as:  LASIX Take 1 tablet (40 mg total) by mouth 2 (two) times daily.   KLOR-CON M10 10 MEQ tablet Generic drug:  potassium chloride TAKE 1 TABLET (10 MEQ TOTAL) BY MOUTH 2 (TWO) TIMES DAILY.   latanoprost 0.005 % ophthalmic solution Commonly known as:  XALATAN Place 1 drop into both eyes at bedtime.   metoprolol succinate 25 MG 24 hr tablet Commonly known as:  TOPROL-XL Take 1 tablet (25 mg total) by mouth daily.   multivitamin with minerals Tabs tablet Take 1 tablet by mouth every evening.   Rivaroxaban 15 MG Tabs tablet Commonly known as:  XARELTO Take 1 tablet (15 mg total) by mouth daily with supper. What changed:  medication strength  how much to take   saccharomyces boulardii 250 MG capsule Commonly known as:  FLORASTOR Take 1 capsule (250 mg total) by mouth 2 (two) times daily.   vitamin C 500 MG tablet Commonly known as:  ASCORBIC ACID Take 500 mg by mouth daily with lunch.      Allergies  Allergen Reactions  . Lisinopril Cough   Follow-up Information    ROBERTS, Vernie AmmonsONALD WAYNE, MD Follow up in 2 week(s).   Specialty:  Internal Medicine Contact information: [address] [place] [address] [phone]        Peter SwazilandJordan, MD  Follow up in 2 week(s).   Specialty:  Cardiology Contact information: [address] [place] [address] [phone]            The results of significant diagnostics from this hospitalization (including imaging, microbiology, ancillary and laboratory) are listed below for reference.    Significant Diagnostic Studies: Dg Chest 2 View  Result Date: 03/28/2016 CLINICAL DATA:  Cough and congestion.  Medicated hypertension. EXAM: CHEST  2 VIEW COMPARISON:  03/27/2016; 11/02/2015; 06/06/2015; chest CT - 12/14/2015 FINDINGS: Grossly unchanged cardiac silhouette and mediastinal contours. Atherosclerotic plaque within the thoracic aorta. The lungs remain hyperexpanded with flattening of the bilateral hemidiaphragms. Chronic trace left-sided pleural effusion, similar to the 10/2015 examination. No focal airspace opacities. No pleural effusion or pneumothorax. No evidence of edema. No acute osseus abnormalities. IMPRESSION: 1. Hyperexpanded lungs  without acute cardiopulmonary disease. 2. Chronic trace left-sided effusion.  No evidence of edema. 3.  Aortic Atherosclerosis (ICD10-170.0) Electronically Signed   By: Simonne Come M.D.   On: 03/28/2016 08:19   Dg Chest 2 View  Result Date: 03/27/2016 CLINICAL DATA:  Worsening cough, congestion, and wheezing since yesterday. EXAM: CHEST  2 VIEW COMPARISON:  CT of the chest 12/14/2015 FINDINGS: The heart size is normal. Chronic interstitial coarsening is evident. There is of linear atelectasis are present. There is blunting of costophrenic angles bilaterally. Asymmetric left lower lobe airspace disease is slightly increased and worrisome for early pneumonia. Atherosclerotic changes are present at the aortic arch. Degenerative changes and scoliosis are present in the thoracic spine without change. The visualized soft tissues and bony thorax are otherwise unremarkable. IMPRESSION: 1. Small left pleural effusion and airspace disease is worrisome for  early pneumonia. 2. Stable chronic interstitial coarsening. 3. Atherosclerosis. Electronically Signed   By: Marin Roberts M.D.   On: 03/27/2016 08:28    Microbiology: Recent Results (from the past 240 hour(s))  Urine culture     Status: None   Collection Time: 03/27/16  8:35 AM  Result Value Ref Range Status   Specimen Description URINE, RANDOM  Final   Special Requests NONE  Final   Culture NO GROWTH Performed at [facility]   Final   Report Status 03/28/2016 FINAL  Final  Culture, blood (routine x 2) Call MD if unable to obtain prior to antibiotics being given     Status: None (Preliminary result)   Collection Time: 03/27/16 12:23 PM  Result Value Ref Range Status   Specimen Description BLOOD RIGHT ARM  Final   Special Requests BOTTLES DRAWN AEROBIC AND ANAEROBIC  10CC  Final   Culture   Final    NO GROWTH 3 DAYS Performed at [facility]    Report Status PENDING  Incomplete  Culture, blood (routine x 2) Call MD if unable to obtain prior to antibiotics being given     Status: None (Preliminary result)   Collection Time: 03/27/16 12:24 PM  Result Value Ref Range Status   Specimen Description BLOOD RIGHT ARM  Final   Special Requests BOTTLES DRAWN AEROBIC AND ANAEROBIC  10CC  Final   Culture   Final    NO GROWTH 3 DAYS Performed at [facility]    Report Status PENDING  Incomplete  Culture, sputum-assessment     Status: None   Collection Time: 03/28/16  6:05 AM  Result Value Ref Range Status   Specimen Description SPUTUM  Final   Special Requests Normal  Final   Sputum evaluation   Final    MICROSCOPIC FINDINGS SUGGEST THAT THIS SPECIMEN IS NOT REPRESENTATIVE OF LOWER RESPIRATORY SECRETIONS. PLEASE RECOLLECT. NOTIFIED Samantha Crimes RN AT 1610 ON 09.06.17 BY SHUEA    Report Status 03/28/2016 FINAL  Final  Bordetella pertussis PCR     Status: None   Collection Time: 03/28/16  6:12 AM  Result Value Ref Range Status   B pertussis, DNA Negative  Negative Final   B parapertussis, DNA Negative Negative Final    Comment: (NOTE) This test was developed and its performance characteristics determined by [employer]. It has not been cleared or approved by the U.S. Food and Drug Administration. The FDA has determined that such clearance or approval is not necessary. This test is used for clinical purposes. It should not be regarded as investigational or research. Performed At: [facility] [address]  [address] [place], [place] [id number] Mila Homer MD WU:[id number]      Labs: Basic Metabolic Panel:  Recent Labs Lab 03/27/16 0851 03/28/16 0524 03/29/16 0458 03/30/16 0518  NA 131* 131* 133* 133*  K 3.9 4.1 4.1 4.0  CL 97* 96* 99* 98*  CO2 25 25 27 27   GLUCOSE 106* 113* 93 90  BUN 16 18 18 13   CREATININE 0.78 0.77 0.89 0.74  CALCIUM 9.1 9.3 8.8* 8.7*   Liver Function Tests:  Recent Labs Lab 03/27/16 0851 03/28/16 0524  AST 29 31  ALT 26 29  ALKPHOS 89 75  BILITOT 0.6 0.5  PROT 7.3 7.0  ALBUMIN 4.6 4.3   No results for input(s): LIPASE, AMYLASE in the last 168 hours. No results for input(s): AMMONIA in the last 168 hours. CBC:  Recent Labs Lab 03/27/16 0851 03/28/16 0524 03/29/16 0458 03/30/16 0518  WBC 11.1* 14.2* 10.5 8.2  NEUTROABS 9.9*  --   --   --   HGB 12.6 11.7* 10.5* 10.9*  HCT 36.4 32.9* 30.0* 31.2*  MCV 91.0 88.0 89.3 91.8  PLT 213 232 183 195   Cardiac Enzymes: No results for input(s): CKTOTAL, CKMB, CKMBINDEX, TROPONINI in the last 168 hours. BNP: BNP (last 3 results)  Recent Labs  06/01/15 1250 12/13/15 2346 03/27/16 0851  BNP 286.8* 196.7* 181.2*    ProBNP (last 3 results) No results for input(s): PROBNP in the last 8760 hours.  CBG: No results for input(s): GLUCAP in the last 168 hours.  Time spent: 41 minutes were spent in preparing this discharge including medication reconciliation, counseling, and coordination of care.  Signed:  Mir Vergie Living  [facility] 03/30/2016, 12:38 PM

## 2016-03-30 NOTE — Plan of Care (Signed)
Problem: Safety: Goal: Ability to remain free from injury will improve Outcome: Progressing Pt up with standby assist.  Slightly unsteady.

## 2016-03-30 NOTE — Care Management Note (Signed)
Case Management Note  Patient Details  Name: Towanda Octavelizabeth W Simms MRN: [id number] Date of Birth: [birth year]  Subjective/Objective:                    Action/Plan:d/c home no needs or orders.   Expected Discharge Date:   (UNKNOWN)               Expected Discharge Plan:  Home/Self Care  In-House Referral:     Discharge planning Services  CM Consult  Post Acute Care Choice:    Choice offered to:     DME Arranged:    DME Agency:     HH Arranged:    HH Agency:     Status of Service:  Completed, signed off  If discussed at [employer] Length of Stay Meetings, dates discussed:    Additional Comments:  Lanier ClamMahabir, Perfecto Purdy, RN 03/30/2016, 1:23 PM

## 2016-04-01 LAB — CULTURE, BLOOD (ROUTINE X 2)
Culture: NO GROWTH
Culture: NO GROWTH

## 2016-04-02 ENCOUNTER — Telehealth: Payer: Self-pay | Admitting: Cardiology

## 2016-04-02 MED ORDER — RIVAROXABAN 15 MG PO TABS: 15.0000 mg | ORAL_TABLET | Freq: Every day | ORAL | 0 refills | Status: DC

## 2016-04-02 NOTE — Telephone Encounter (Signed)
New message    Pt can't afford the rx due to cost. Please call.

## 2016-04-02 NOTE — Telephone Encounter (Signed)
Pt calling regarding Xarelto - notes she is in donut hole, on new dose of this medication (decreased recently in hospital).  I put samples for new dose of Xarelto at front desk for patient, she is aware. Will have home caregiver pick up today. Pt voiced acknowledgment. Also confirmed upcoming appt w Franky MachoLuke.

## 2016-04-09 ENCOUNTER — Encounter: Payer: Self-pay | Admitting: Cardiology

## 2016-04-17 ENCOUNTER — Ambulatory Visit: Payer: Medicare Other | Admitting: Cardiology

## 2016-04-19 NOTE — Progress Notes (Signed)
Cardiology Office Note   Date:  04/20/2016   ID:  [name] Jones, DOB 1924/01/15, MRN [id number]  PCP:  Lorenda PeckOBERTS, RONALD WAYNE, MD  Cardiologist:   Mayu Ronk [place], MD   Chief Complaint  Patient presents with  . Follow-up    F/U after hospital.  . Atrial Fibrillation  . Congestive Heart Failure      History of Present Illness: [name] Jones is a 82 y.o. female who presents for follow up CHF and atrial fibrillation.  She has a history of HTN. In early November 2016 she was noted to have new onset atrial fibrillation. She was started on Xarelto. She deteriorated and was admitted with acute diastolic CHF. She was diuresed with IV lasix. She had hyponatremia. She had an Echo in May 2016 while in NSR showing Normal LV function with grade 2 diastolic dysfunction. There was moderate MR and mild to moderate TR. Mild pulmonary HTN. Repeat Echo in hospital showed normal LV function with severe MR and TR and mod to severe biatrial enlargement.  She later underwent DCCV on 09/15/15. On follow up she was noted to be bradycardic with HR into low 40s. Her metoprolol and cardizem doses were reduced.    She was admitted in early September with CAP LLL. Was in NSR at that time. Xarelto dose was reduced.   On follow up today she notes she still has a cough productive of yellow to gray thick sputum. No fever. SOB is better. No palpitations. No bleeding. No edema. Weight has been stable.     Past Medical History:  Diagnosis Date  . Atrial fibrillation (HCC)   . Chronic diastolic CHF (congestive heart failure) (HCC)   . Glaucoma   . Hypertension   . Hyponatremia   . Iron deficiency anemia   . Mitral insufficiency   . TIA (transient ischemic attack) 1997  . Tricuspid insufficiency     Past Surgical History:  Procedure Laterality Date  . ABDOMINAL HYSTERECTOMY    . CARDIOVERSION N/A 09/15/2015   Procedure: CARDIOVERSION;  Surgeon: Wendall StadePeter C Nishan, MD;  [facility];  Service:  Cardiovascular;  Laterality: N/A;  . CATARACT EXTRACTION    . DIAGNOSTIC LAPAROSCOPY    . STRABISMUS SURGERY    . TONSILLECTOMY       Current Outpatient Prescriptions  Medication Sig Dispense Refill  . acetaminophen (TYLENOL) 325 MG tablet Take 650 mg by mouth at bedtime as needed for moderate pain.     Marland Kitchen. amiodarone (PACERONE) 200 MG tablet Take 1 tablet (200 mg total) by mouth daily. (Patient taking differently: Take 200 mg by mouth daily with lunch. ) 90 tablet 3  . calcium carbonate (OS-CAL - DOSED IN MG OF ELEMENTAL CALCIUM) 1250 (500 CA) MG tablet Take 1 tablet by mouth every evening.     . carteolol (OCUPRESS) 1 % ophthalmic solution Place 1 drop into both eyes 2 (two) times daily.    . [employer] TUSSIN DM 100-10 MG/5ML liquid Take 5 mLs by mouth every 4 (four) hours as needed for cough.   0  . furosemide (LASIX) 40 MG tablet Take 1 tablet (40 mg total) by mouth 2 (two) times daily. 180 tablet 3  . KLOR-CON M10 10 MEQ tablet TAKE 1 TABLET (10 MEQ TOTAL) BY MOUTH 2 (TWO) TIMES DAILY. 60 tablet 1  . latanoprost (XALATAN) 0.005 % ophthalmic solution Place 1 drop into both eyes at bedtime.    . metoprolol succinate (TOPROL-XL) 25 MG 24 hr tablet Take  1 tablet (25 mg total) by mouth daily. 30 tablet 4  . Multiple Vitamin (MULTIVITAMIN WITH MINERALS) TABS tablet Take 1 tablet by mouth every evening.     . Rivaroxaban (XARELTO) 15 MG TABS tablet Take 1 tablet (15 mg total) by mouth daily with supper. 35 tablet 0  . saccharomyces boulardii (FLORASTOR) 250 MG capsule Take 1 capsule (250 mg total) by mouth 2 (two) times daily. 10 capsule 0  . vitamin C (ASCORBIC ACID) 500 MG tablet Take 500 mg by mouth daily with lunch.      No current facility-administered medications for this visit.     Allergies:   Lisinopril    Social History:  The patient  reports that she has never smoked. She has never used smokeless tobacco. She reports that she drinks alcohol. She reports that she does not use drugs.    Family History:  The patient's family history includes Heart attack in her brother; Heart attack (age of onset: 58) in her son; Heart attack (age of onset: 72) in her father; Heart disease in her mother; Hypertension in her son; Ovarian cancer (age of onset: 36) in her mother.    ROS:  Please see the history of present illness.   Otherwise, review of systems are positive for none.   All other systems are reviewed and negative.    PHYSICAL EXAM: VS:  BP 138/82   Pulse 72   Ht 5' (1.524 m)   Wt 117 lb (53.1 kg)   BMI 22.85 kg/m  , BMI Body mass index is 22.85 kg/m. GEN: Well nourished, elderly, in no acute distress  HEENT: normal  Neck: no JVD, carotid bruits, or masses Cardiac: RRR; normal S1-2. Gr 2/6 systolic murmur at the apex. No edema. Respiratory:  clear to auscultation bilaterally, normal work of breathing GI: soft, nontender, nondistended, + BS MS: no deformity or atrophy  Skin: warm and dry, no rash Neuro:  Strength and sensation are intact Psych: euthymic mood, full affect   EKG:  EKG is not ordered today.    Recent Labs: 03/22/2016: TSH 2.55 03/27/2016: B Natriuretic Peptide 181.2 03/28/2016: ALT 29 03/30/2016: BUN 13; Creatinine, Ser 0.74; Hemoglobin 10.9; Platelets 195; Potassium 4.0; Sodium 133    Lipid Panel    Component Value Date/Time   CHOL 161 06/08/2015 2322   TRIG 76 06/08/2015 2322   HDL 53 06/08/2015 2322   CHOLHDL 3.0 06/08/2015 2322   VLDL 15 06/08/2015 2322   LDLCALC 93 06/08/2015 2322      Wt Readings from Last 3 Encounters:  04/20/16 117 lb (53.1 kg)  03/30/16 125 lb 14.1 oz (57.1 kg)  12/23/15 115 lb (52.2 kg)      Other studies Reviewed: Additional studies/ records that were reviewed today include: Hospital records from early September and Chest CT from May.   ASSESSMENT AND PLAN:  1.  Atrial fibrillation s/p DCCV in February 2017. Maintaining NSR on amiodarone even in setting of PNA. Will continue amiodarone. Continue metoprolol  and Xarelto.   2. Chronic diastolic CHF. Exacerbated by Afib. On appropriate diuretic therapy and appears euvolemic. BNP in the hospital earlier this month was even lower suggesting that there was not exacerbation of CHF.  3. Hyponatremia. Corrected.   4. Moderate to severe MR and TR.   Will treat medically. She is not a candidate for valve surgery.   5. Iron deficiency anemia.  6. HTN- well controlled.  7. CAP- improved but still some cough. Hopefully this will improve  with time.    Current medicines are reviewed at length with the patient today.  The patient does not have concerns regarding medicines.  The following changes have been made:  no change  Labs/ tests ordered today include:   No orders of the defined types were placed in this encounter.   Disposition:   FU with 3 months   Signed, Jahmeek Shirk [place], MD  04/20/2016 5:18 PM    [facility] [address], [place], [place], [address] Phone [phone], Fax [phone]

## 2016-04-20 ENCOUNTER — Encounter: Payer: Self-pay | Admitting: Cardiology

## 2016-04-20 ENCOUNTER — Ambulatory Visit (INDEPENDENT_AMBULATORY_CARE_PROVIDER_SITE_OTHER): Payer: Medicare Other | Admitting: Cardiology

## 2016-04-20 VITALS — BP 138/82 | HR 72 | Ht 60.0 in | Wt 117.0 lb

## 2016-04-20 DIAGNOSIS — J189 Pneumonia, unspecified organism: Secondary | ICD-10-CM | POA: Diagnosis not present

## 2016-04-20 DIAGNOSIS — I480 Paroxysmal atrial fibrillation: Secondary | ICD-10-CM

## 2016-04-20 DIAGNOSIS — I10 Essential (primary) hypertension: Secondary | ICD-10-CM

## 2016-04-20 DIAGNOSIS — I5032 Chronic diastolic (congestive) heart failure: Secondary | ICD-10-CM

## 2016-04-20 NOTE — Patient Instructions (Addendum)
Continue your current therapy  I will see you in 4 months  

## 2016-04-24 ENCOUNTER — Other Ambulatory Visit: Payer: Self-pay | Admitting: Cardiology

## 2016-05-30 ENCOUNTER — Telehealth: Payer: Self-pay | Admitting: Cardiology

## 2016-05-30 NOTE — Telephone Encounter (Signed)
Spoke with pt states that she is wanting a left knee replacement with Dr Farris HasKramer [facility]. She states that she has had increased pain and limping lately and was wanting to have this done now, she states that she has been needing it for years but she is able to have it now. Please advise

## 2016-05-30 NOTE — Telephone Encounter (Signed)
I think she is at moderate risk for cardiovascular complications with knee surgery but not prohibitive. She would need to hold Xarelto for 48 hours prior to surgery.  Peter SwazilandJordan MD, [facility]

## 2016-05-30 NOTE — Telephone Encounter (Signed)
New message  Needs advice for knee replacement  Please call back

## 2016-05-31 NOTE — Telephone Encounter (Signed)
Returned call to patient.Patient not available.LMTC.

## 2016-06-01 NOTE — Telephone Encounter (Signed)
Returned call to patient 05/31/16.She stated knee surgery has not been scheduled yet.Stated she will have orthopedic Dr fax over clearance.

## 2016-06-09 ENCOUNTER — Emergency Department (HOSPITAL_COMMUNITY): Payer: Medicare Other

## 2016-06-09 ENCOUNTER — Inpatient Hospital Stay (HOSPITAL_COMMUNITY)
Admission: EM | Admit: 2016-06-09 | Discharge: 2016-06-14 | DRG: 552 | Disposition: A | Discharge status: Skilled Nursing Facility | Payer: Medicare Other | Attending: Internal Medicine | Admitting: Internal Medicine

## 2016-06-09 ENCOUNTER — Encounter (HOSPITAL_COMMUNITY): Payer: Self-pay | Admitting: *Deleted

## 2016-06-09 DIAGNOSIS — Z8041 Family history of malignant neoplasm of ovary: Secondary | ICD-10-CM

## 2016-06-09 DIAGNOSIS — Z9889 Other specified postprocedural states: Secondary | ICD-10-CM

## 2016-06-09 DIAGNOSIS — Z9849 Cataract extraction status, unspecified eye: Secondary | ICD-10-CM

## 2016-06-09 DIAGNOSIS — I10 Essential (primary) hypertension: Secondary | ICD-10-CM | POA: Diagnosis not present

## 2016-06-09 DIAGNOSIS — I480 Paroxysmal atrial fibrillation: Secondary | ICD-10-CM | POA: Diagnosis not present

## 2016-06-09 DIAGNOSIS — Z79899 Other long term (current) drug therapy: Secondary | ICD-10-CM

## 2016-06-09 DIAGNOSIS — W1830XA Fall on same level, unspecified, initial encounter: Secondary | ICD-10-CM | POA: Diagnosis present

## 2016-06-09 DIAGNOSIS — Z8673 Personal history of transient ischemic attack (TIA), and cerebral infarction without residual deficits: Secondary | ICD-10-CM

## 2016-06-09 DIAGNOSIS — Z7901 Long term (current) use of anticoagulants: Secondary | ICD-10-CM

## 2016-06-09 DIAGNOSIS — I4891 Unspecified atrial fibrillation: Secondary | ICD-10-CM | POA: Diagnosis present

## 2016-06-09 DIAGNOSIS — I110 Hypertensive heart disease with heart failure: Secondary | ICD-10-CM | POA: Diagnosis present

## 2016-06-09 DIAGNOSIS — S32011A Stable burst fracture of first lumbar vertebra, initial encounter for closed fracture: Secondary | ICD-10-CM | POA: Diagnosis not present

## 2016-06-09 DIAGNOSIS — Z888 Allergy status to other drugs, medicaments and biological substances status: Secondary | ICD-10-CM

## 2016-06-09 DIAGNOSIS — Y92010 Kitchen of single-family (private) house as the place of occurrence of the external cause: Secondary | ICD-10-CM

## 2016-06-09 DIAGNOSIS — S32001A Stable burst fracture of unspecified lumbar vertebra, initial encounter for closed fracture: Secondary | ICD-10-CM

## 2016-06-09 DIAGNOSIS — H409 Unspecified glaucoma: Secondary | ICD-10-CM | POA: Diagnosis present

## 2016-06-09 DIAGNOSIS — Z90710 Acquired absence of both cervix and uterus: Secondary | ICD-10-CM

## 2016-06-09 DIAGNOSIS — I5032 Chronic diastolic (congestive) heart failure: Secondary | ICD-10-CM | POA: Diagnosis present

## 2016-06-09 DIAGNOSIS — Z8249 Family history of ischemic heart disease and other diseases of the circulatory system: Secondary | ICD-10-CM

## 2016-06-09 DIAGNOSIS — K5900 Constipation, unspecified: Secondary | ICD-10-CM | POA: Diagnosis present

## 2016-06-09 LAB — I-STAT CHEM 8, ED
BUN: 23 mg/dL — ABNORMAL HIGH (ref 6–20)
Calcium, Ion: 1.09 mmol/L — ABNORMAL LOW (ref 1.15–1.40)
Chloride: 98 mmol/L — ABNORMAL LOW (ref 101–111)
Creatinine, Ser: 1 mg/dL (ref 0.44–1.00)
Glucose, Bld: 131 mg/dL — ABNORMAL HIGH (ref 65–99)
HCT: 36 % (ref 36.0–46.0)
Hemoglobin: 12.2 g/dL (ref 12.0–15.0)
Potassium: 3.8 mmol/L (ref 3.5–5.1)
Sodium: 135 mmol/L (ref 135–145)
TCO2: 26 mmol/L (ref 0–100)

## 2016-06-09 MED ORDER — SACCHAROMYCES BOULARDII 250 MG PO CAPS
250.0000 mg | ORAL_CAPSULE | Freq: Two times a day (BID) | ORAL | Status: DC
  Administered 2016-06-10 – 2016-06-14 (×8): 250 mg via ORAL
  Filled 2016-06-09 (×8): qty 1

## 2016-06-09 MED ORDER — FUROSEMIDE 40 MG PO TABS
40.0000 mg | ORAL_TABLET | Freq: Two times a day (BID) | ORAL | Status: DC
  Administered 2016-06-10 – 2016-06-14 (×6): 40 mg via ORAL
  Filled 2016-06-09 (×7): qty 1

## 2016-06-09 MED ORDER — IOPAMIDOL (ISOVUE-300) INJECTION 61%
100.0000 mL | Freq: Once | INTRAVENOUS | Status: AC | PRN
Start: 2016-06-09 — End: 2016-06-09
  Administered 2016-06-09: 100 mL via INTRAVENOUS

## 2016-06-09 MED ORDER — HYDRALAZINE HCL 20 MG/ML IJ SOLN: 10.0000 mg | INTRAMUSCULAR | Status: DC | PRN

## 2016-06-09 MED ORDER — DEXTROMETHORPHAN-GUAIFENESIN 10-100 MG/5ML PO LIQD
5.0000 mL | ORAL | Status: DC | PRN
  Filled 2016-06-09: qty 5

## 2016-06-09 MED ORDER — AMIODARONE HCL 200 MG PO TABS
200.0000 mg | ORAL_TABLET | Freq: Every day | ORAL | Status: DC
  Administered 2016-06-10 – 2016-06-14 (×5): 200 mg via ORAL
  Filled 2016-06-09 (×5): qty 1

## 2016-06-09 MED ORDER — MORPHINE SULFATE (PF) 2 MG/ML IV SOLN
2.0000 mg | INTRAVENOUS | Status: DC | PRN
  Administered 2016-06-10 – 2016-06-11 (×2): 2 mg via INTRAVENOUS
  Filled 2016-06-09 (×2): qty 1

## 2016-06-09 MED ORDER — IOPAMIDOL (ISOVUE-300) INJECTION 61%
INTRAVENOUS | Status: AC
Start: 2016-06-09 — End: 2016-06-10
  Filled 2016-06-09: qty 100

## 2016-06-09 MED ORDER — POTASSIUM CHLORIDE CRYS ER 10 MEQ PO TBCR
10.0000 meq | EXTENDED_RELEASE_TABLET | Freq: Two times a day (BID) | ORAL | Status: DC
  Administered 2016-06-10 – 2016-06-14 (×9): 10 meq via ORAL
  Filled 2016-06-09 (×9): qty 1

## 2016-06-09 MED ORDER — ADULT MULTIVITAMIN W/MINERALS CH
1.0000 | ORAL_TABLET | Freq: Every evening | ORAL | Status: DC
  Administered 2016-06-11 – 2016-06-13 (×3): 1 via ORAL
  Filled 2016-06-09 (×4): qty 1

## 2016-06-09 MED ORDER — METOPROLOL SUCCINATE ER 25 MG PO TB24
25.0000 mg | ORAL_TABLET | Freq: Every day | ORAL | Status: DC
  Administered 2016-06-10 – 2016-06-14 (×6): 25 mg via ORAL
  Filled 2016-06-09 (×7): qty 1

## 2016-06-09 MED ORDER — LATANOPROST 0.005 % OP SOLN
1.0000 [drp] | Freq: Every day | OPHTHALMIC | Status: DC
  Administered 2016-06-10 – 2016-06-13 (×5): 1 [drp] via OPHTHALMIC
  Filled 2016-06-09: qty 2.5

## 2016-06-09 MED ORDER — CALCIUM CARBONATE 1250 (500 CA) MG PO TABS
1.0000 | ORAL_TABLET | Freq: Every evening | ORAL | Status: DC
  Administered 2016-06-11 – 2016-06-13 (×2): 500 mg via ORAL
  Filled 2016-06-09 (×6): qty 1

## 2016-06-09 MED ORDER — SODIUM CHLORIDE 0.9 % IJ SOLN
INTRAMUSCULAR | Status: AC
  Administered 2016-06-09
  Filled 2016-06-09: qty 50

## 2016-06-09 MED ORDER — TIMOLOL MALEATE 0.5 % OP SOLN
1.0000 [drp] | Freq: Two times a day (BID) | OPHTHALMIC | Status: DC
  Administered 2016-06-10 – 2016-06-14 (×10): 1 [drp] via OPHTHALMIC
  Filled 2016-06-09: qty 5

## 2016-06-09 MED ORDER — MORPHINE SULFATE (PF) 2 MG/ML IV SOLN
2.0000 mg | Freq: Once | INTRAVENOUS | Status: AC
  Administered 2016-06-09: 2 mg via INTRAVENOUS
  Filled 2016-06-09: qty 1

## 2016-06-09 MED ORDER — ACETAMINOPHEN 325 MG PO TABS
650.0000 mg | ORAL_TABLET | Freq: Every evening | ORAL | Status: DC | PRN
  Administered 2016-06-10 – 2016-06-14 (×4): 650 mg via ORAL
  Filled 2016-06-09 (×4): qty 2

## 2016-06-09 NOTE — ED Notes (Signed)
Bed: WA17 Expected date:  Expected time:  Means of arrival:  Comments: 82 yo fall 

## 2016-06-09 NOTE — ED Provider Notes (Signed)
[facility]-EMERGENCY DEPT Provider Note   CSN: [id number] Arrival date & time: 06/09/16  1924     History   Chief Complaint Chief Complaint  Patient presents with  . Fall    HPI [name] Jones is a 82 y.o. female.  HPI 82 year old female who presents after mechanical fall. 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 States that she has been in her usual state of health, and was cooking a pre-Thanksgiving meal in her kitchen. Was taking something out of the microwave and putting it off to the side when she lost her balance. She fell on her back. She did not hit her head and did not have any loss of consciousness. States that she has 24-hour CNA's who take care of her husband and one of them helped her to a chair. Complained of significant low back pain, and came to ED for evaluation. Denies focal numbness, weakness, chest pain or difficulty breathing, headache or neck pain, nausea or vomiting. States that she does have pain in her low pelvis as well, but no abdominal pain. Past Medical History:  Diagnosis Date  . Atrial fibrillation (HCC)   . Chronic diastolic CHF (congestive heart failure) (HCC)   . Glaucoma   . Hypertension   . Hyponatremia   . Iron deficiency anemia   . Mitral insufficiency   . TIA (transient ischemic attack) 1997  . Tricuspid insufficiency     Patient Active Problem List   Diagnosis Date Noted  . CAP (community acquired pneumonia) 03/27/2016  . Pneumonia 03/27/2016  . Bradycardia by electrocardiogram 09/21/2015  . Chronic diastolic CHF (congestive heart failure) (HCC) 09/05/2015  . Atrial fibrillation (HCC) 09/05/2015  . HTN (hypertension) 09/05/2015  . Hyponatremia 09/05/2015    Past Surgical History:  Procedure Laterality Date  . ABDOMINAL HYSTERECTOMY    . CARDIOVERSION N/A 09/15/2015   Procedure: CARDIOVERSION;  Surgeon: Wendall Stade, MD;  Location: [facility] ENDOSCOPY;  Service: Cardiovascular;   Laterality: N/A;  . CATARACT EXTRACTION    . DIAGNOSTIC LAPAROSCOPY    . STRABISMUS SURGERY    . TONSILLECTOMY      OB History    No data available       Home Medications    Prior to Admission medications   Medication Sig Start Date End Date Taking? Authorizing Provider  acetaminophen (TYLENOL) 325 MG tablet Take 650 mg by mouth at bedtime as needed for moderate pain.     Historical Provider, MD  amiodarone (PACERONE) 200 MG tablet Take 1 tablet (200 mg total) by mouth daily. Patient taking differently: Take 200 mg by mouth daily with lunch.  10/28/15   Peter M [place], MD  calcium carbonate (OS-CAL - DOSED IN MG OF ELEMENTAL CALCIUM) 1250 (500 CA) MG tablet Take 1 tablet by mouth every evening.     Historical Provider, MD  carteolol (OCUPRESS) 1 % ophthalmic solution Place 1 drop into both eyes 2 (two) times daily.    Historical Provider, MD  [employer]S TUSSIN DM 100-10 MG/5ML liquid Take 5 mLs by mouth every 4 (four) hours as needed for cough.  06/10/15   Historical Provider, MD  furosemide (LASIX) 40 MG tablet Take 1 tablet (40 mg total) by mouth 2 (two) times daily. 10/28/15   Peter M [place], MD  KLOR-CON M10 10 MEQ tablet TAKE 1 TABLET (10 MEQ TOTAL) BY MOUTH 2 (TWO) TIMES DAILY. 04/25/16   Peter M [place], MD  latanoprost (XALATAN) 0.005 % ophthalmic solution  Place 1 drop into both eyes at bedtime.    Historical Provider, MD  metoprolol succinate (TOPROL-XL) 25 MG 24 hr tablet Take 1 tablet (25 mg total) by mouth daily. 01/16/16   Peter M [place]il[place]Jordan, MD  Multiple Vitamin (MULTIVITAMIN WITH MINERALS) TABS tablet Take 1 tablet by mouth every evening.     Historical Provider, MD  Rivaroxaban (XARELTO) 15 MG TABS tablet Take 1 tablet (15 mg total) by mouth daily with supper. 04/02/16   Peter M [place]Jordan, MD  saccharomyces boulardii (FLORASTOR) 250 MG capsule Take 1 capsule (250 mg total) by mouth 2 (two) times daily. 03/30/16   Mir Vergie LivingMohammed Ikramullah, MD  vitamin C (ASCORBIC ACID) 500 MG tablet Take 500 mg  by mouth daily with lunch.     Historical Provider, MD    Family History Family History  Problem Relation Age of Onset  . Heart disease Mother   . Ovarian cancer Mother 6152  . Heart attack Father 3576  . Heart attack Brother   . Heart attack Son 6852  . Hypertension Son     Social History Social History  Substance Use Topics  . Smoking status: Never Smoker  . Smokeless tobacco: Never Used  . Alcohol use 0.0 oz/week     Comment: occasional     Allergies   Lisinopril   Review of Systems Review of Systems 10/14 systems reviewed and are negative other than those stated in the HPI   Physical Exam Updated Vital Signs BP (!) 212/85 (BP Location: Right Arm)   Pulse 69   Temp 97.9 F (36.6 C) (Oral)   Resp 18   SpO2 97%   Physical Exam Physical Exam  Nursing note and vitals reviewed. Constitutional: Well developed, well nourished, non-toxic, and in no acute distress Head: Normocephalic and atraumatic.  Mouth/Throat: Oropharynx is clear and moist.  Neck: Normal range of motion. Neck supple.  Cardiovascular: Normal rate and regular rhythm.   Pulmonary/Chest: Effort normal and breath sounds normal.  Abdominal: Soft. There is no tenderness. There is no rebound and no guarding.  Musculoskeletal: Normal range of motion. Upper Lumbar spine tenderness to palpation.  Neurological: Alert, no facial droop, fluent speech, moves all extremities symmetrically Skin: Skin is warm and dry.  Psychiatric: Cooperative   ED Treatments / Results  Labs (all labs ordered are listed, but only abnormal results are displayed) Labs Reviewed  I-STAT CHEM 8, ED - Abnormal; Notable for the following:       Result Value   Chloride 98 (*)    BUN 23 (*)    Glucose, Bld 131 (*)    Calcium, Ion 1.09 (*)    All other components within normal limits    EKG  EKG Interpretation None       Radiology Ct Cervical Spine Wo Contrast  Result Date: 06/09/2016 CLINICAL DATA:  Neck pain after  falling today.  Anticoagulated. EXAM: CT CERVICAL SPINE WITHOUT CONTRAST TECHNIQUE: Multidetector CT imaging of the cervical spine was performed without intravenous contrast. Multiplanar CT image reconstructions were also generated. COMPARISON:  None. FINDINGS: Alignment: Normal. Skull base and vertebrae: No acute fracture. No primary bone lesion or focal pathologic process. Soft tissues and spinal canal: No prevertebral fluid or swelling. No visible canal hematoma. Disc levels: Multilevel degenerative cervical disc disease, greatest at C5-6 and C6-7. Facet articulations are arthritic but otherwise intact. Mild degenerative anterolisthesis at C7-T1. Upper chest: No significant abnormality Other: None IMPRESSION: Cervical degenerative disc and facet changes. Negative for acute cervical spine  fracture. No acute soft tissue abnormality. Electronically Signed   By: Ellery Plunkaniel R Mitchell M.D.   On: 06/09/2016 22:34   Ct Abdomen Pelvis W Contrast  Result Date: 06/09/2016 CLINICAL DATA:  Left flank pain. Lumbar spine pain. Concern for retroperitoneal bleeding as the patient is anticoagulated for atrial fibrillation. EXAM: CT ABDOMEN AND PELVIS WITH CONTRAST TECHNIQUE: Multidetector CT imaging of the abdomen and pelvis was performed using the standard protocol following bolus administration of intravenous contrast. CONTRAST:  100mL ISOVUE-300 IOPAMIDOL (ISOVUE-300) INJECTION 61% COMPARISON:  None. FINDINGS: Lower chest: No visible pleural or pericardial effusion. Small subpleural nodular opacity in the left lung base. Multifocal septal thickening. Hepatobiliary: Normal hepatic size and contours without focal liver lesion. No perihepatic ascites. No intra- or extrahepatic biliary dilatation. Normal gallbladder. Pancreas: Normal pancreatic contours and enhancement. No peripancreatic fluid collection or pancreatic ductal dilatation. Spleen: Normal. Adrenals/Urinary Tract: Normal adrenal glands. No hydronephrosis or solid  renal mass. Stomach/Bowel: There is rectosigmoid diverticulosis without acute inflammation. No small bowel dilatation. No abdominal fluid collection. The appendix is not clearly visualized. There is no retroperitoneal hematoma. Vascular/Lymphatic: There is atherosclerotic calcification of the non aneurysmal abdominal aorta. No abdominal or pelvic adenopathy. Reproductive: Status post hysterectomy.  No adnexal mass. Musculoskeletal: Multilevel lumbar facet arthrosis and osteophyte formation. There is a compression fracture of the L1 vertebral body with approximately 25% height loss anteriorly with no stenosis-causing retropulsion. The fracture involves the superior endplate and the anterior and posterior walls. There is grade 1 anterolisthesis at L4-L5. Other: No contributory non-categorized findings. IMPRESSION: 1. Acute incomplete burst type fracture of the L1 vertebral body, involving the anterior and posterior walls and the superior endplate. Approximately 25% height loss but only minimal retropulsion without resultant stenosis. 2. No retroperitoneal hematoma. 3. Rectosigmoid diverticulosis without acute inflammation. Electronically Signed   By: Deatra RobinsonKevin  Herman M.D.   On: 06/09/2016 22:45    Procedures Procedures (including critical care time)  Medications Ordered in ED Medications  iopamidol (ISOVUE-300) 61 % injection (not administered)  sodium chloride 0.9 % injection (not administered)  morphine 2 MG/ML injection 2 mg (not administered)  amiodarone (PACERONE) tablet 200 mg (not administered)  acetaminophen (TYLENOL) tablet 650 mg (not administered)  saccharomyces boulardii (FLORASTOR) capsule 250 mg (not administered)  multivitamin with minerals tablet 1 tablet (not administered)  metoprolol succinate (TOPROL-XL) 24 hr tablet 25 mg (not administered)  furosemide (LASIX) tablet 40 mg (not administered)  timolol (TIMOPTIC) 0.5 % ophthalmic solution 1 drop (not administered)  calcium carbonate  (OS-CAL - dosed in mg of elemental calcium) tablet 500 mg of elemental calcium (not administered)  dextromethorphan-guaiFENesin (ROBITUSSIN-DM) 10-100 MG/5ML liquid 5 mL (not administered)  potassium chloride (K-DUR,KLOR-CON) CR tablet 10 mEq (not administered)  latanoprost (XALATAN) 0.005 % ophthalmic solution 1 drop (not administered)  morphine 2 MG/ML injection 2 mg (2 mg Intravenous Given 06/09/16 2042)  iopamidol (ISOVUE-300) 61 % injection 100 mL (100 mLs Intravenous Contrast Given 06/09/16 2149)     Initial Impression / Assessment and Plan / ED Course  I have reviewed the triage vital signs and the nursing notes.  Pertinent labs & imaging results that were available during my care of the patient were reviewed by me and considered in my medical decision making (see chart for details).  Clinical Course     No s/s of head injury and she did not hit her head. She is mentating normally and neuro in tact.  C/o of primarily low back pain and some low pelvic pain. Given xarelto,  did perform CT abd/pelvis. This is visualized, showing L1 burst fracture. Discussed with Dr. Bevely Palmer from neurosurgery who did not feel that this was as surgical. He recommended TLSO brace and f/u in 1 week for x-rays. Pain not well controlled with morphine in ED. Will admit to medicine service for pain control and PT. Discussed with Dr. Julian Reil.  Final Clinical Impressions(s) / ED Diagnoses   Final diagnoses:  Closed burst fracture of lumbar vertebra, initial encounter [facility])    New Prescriptions New Prescriptions   No medications on file     Lavera Guise, MD 06/09/16 2323

## 2016-06-09 NOTE — H&P (Signed)
History and Physical    [name] Octavelizabeth W Magill WUJ:[id number] DOB: 1924/01/17 DOA: 06/09/2016   PCP: Lorenda PeckOBERTS, RONALD WAYNE, MD Chief Complaint:  Chief Complaint  Patient presents with  . Fall    HPI: [name] Jones is a 82 y.o. female with medical history significant of A.Fib rhythm controlled with amiodarone but on Xarelto, HTN.  Patient was in usual state of health until she slipped while taking food out of a microwave today at home.  Fell landing on buttocks.  Having back pain since fall.  Pain has been persistent since onset at time of fall earlier today.  Tried nothing for pain at home.  No focal weakness or numbness.  Presents to the ED for evaluation.  She has been ambulatory since the fall.  ED Course: L1 burst fx, 25% height loss, minimal retropulsion, no canal stenosis.  Review of Systems: As per HPI otherwise 10 point review of systems negative.    Past Medical History:  Diagnosis Date  . Atrial fibrillation (HCC)   . Chronic diastolic CHF (congestive heart failure) (HCC)   . Glaucoma   . Hypertension   . Hyponatremia   . Iron deficiency anemia   . Mitral insufficiency   . TIA (transient ischemic attack) 1997  . Tricuspid insufficiency     Past Surgical History:  Procedure Laterality Date  . ABDOMINAL HYSTERECTOMY    . CARDIOVERSION N/A 09/15/2015   Procedure: CARDIOVERSION;  Surgeon: Wendall StadePeter C Nishan, MD;  [facility];  Service: Cardiovascular;  Laterality: N/A;  . CATARACT EXTRACTION    . DIAGNOSTIC LAPAROSCOPY    . STRABISMUS SURGERY    . TONSILLECTOMY       reports that she has never smoked. She has never used smokeless tobacco. She reports that she drinks alcohol. She reports that she does not use drugs.  Allergies  Allergen Reactions  . Lisinopril Cough    Family History  Problem Relation Age of Onset  . Heart disease Mother   . Ovarian cancer Mother 5352  . Heart attack Father 7376  . Heart attack Brother   . Heart attack Son 4352  .  Hypertension Son       Prior to Admission medications   Medication Sig Start Date End Date Taking? Authorizing Provider  acetaminophen (TYLENOL) 325 MG tablet Take 650 mg by mouth at bedtime as needed for moderate pain.     Historical Provider, MD  amiodarone (PACERONE) 200 MG tablet Take 1 tablet (200 mg total) by mouth daily. Patient taking differently: Take 200 mg by mouth daily with lunch.  10/28/15   Peter M SwazilandJordan, MD  calcium carbonate (OS-CAL - DOSED IN MG OF ELEMENTAL CALCIUM) 1250 (500 CA) MG tablet Take 1 tablet by mouth every evening.     Historical Provider, MD  carteolol (OCUPRESS) 1 % ophthalmic solution Place 1 drop into both eyes 2 (two) times daily.    Historical Provider, MD  furosemide (LASIX) 40 MG tablet Take 1 tablet (40 mg total) by mouth 2 (two) times daily. 10/28/15   Peter M [place]Jordan, MD  KLOR-CON M10 10 MEQ tablet TAKE 1 TABLET (10 MEQ TOTAL) BY MOUTH 2 (TWO) TIMES DAILY. 04/25/16   Peter M [place]az[place]Jordan, MD  latanoprost (XALATAN) 0.005 % ophthalmic solution Place 1 drop into both eyes at bedtime.    Historical Provider, MD  metoprolol succinate (TOPROL-XL) 25 MG 24 hr tablet Take 1 tablet (25 mg total) by mouth daily. 01/16/16   Peter M [place]Jordan, MD  Multiple  Vitamin (MULTIVITAMIN WITH MINERALS) TABS tablet Take 1 tablet by mouth every evening.     Historical Provider, MD  Rivaroxaban (XARELTO) 15 MG TABS tablet Take 1 tablet (15 mg total) by mouth daily with supper. 04/02/16   Peter M [place], MD  saccharomyces boulardii (FLORASTOR) 250 MG capsule Take 1 capsule (250 mg total) by mouth 2 (two) times daily. 03/30/16   Mir Vergie Living, MD  vitamin C (ASCORBIC ACID) 500 MG tablet Take 500 mg by mouth daily with lunch.     Historical Provider, MD    Physical Exam: Vitals:   06/09/16 1929 06/09/16 2030 06/09/16 2232  BP: (!) 211/82 (!) 215/80 (!) 212/85  Pulse: 67 66 69  Resp: 18  18  Temp: 97.9 F (36.6 C)    TempSrc: Oral    SpO2: 100% 97% 97%      Constitutional:  NAD, calm, comfortable Eyes: PERRL, lids and conjunctivae normal ENMT: Mucous membranes are moist. Posterior pharynx clear of any exudate or lesions.Normal dentition.  Neck: normal, supple, no masses, no thyromegaly Respiratory: clear to auscultation bilaterally, no wheezing, no crackles. Normal respiratory effort. No accessory muscle use.  Cardiovascular: Regular rate and rhythm, no murmurs / rubs / gallops. No extremity edema. 2+ pedal pulses. No carotid bruits.  Abdomen: no tenderness, no masses palpated. No hepatosplenomegaly. Bowel sounds positive.  Musculoskeletal: no clubbing / cyanosis. No joint deformity upper and lower extremities. Good ROM, no contractures. Normal muscle tone.  Skin: no rashes, lesions, ulcers. No induration Neurologic: CN 2-12 grossly intact. Sensation intact, DTR normal. Strength 5/5 in all 4.  Psychiatric: Normal judgment and insight. Alert and oriented x 3. Normal mood.    Labs on Admission: I have personally reviewed following labs and imaging studies  CBC:  Recent Labs Lab 06/09/16 2031  HGB 12.2  HCT 36.0   Basic Metabolic Panel:  Recent Labs Lab 06/09/16 2031  NA 135  K 3.8  CL 98*  GLUCOSE 131*  BUN 23*  CREATININE 1.00   GFR: CrCl cannot be calculated (Unknown ideal weight.). Liver Function Tests: No results for input(s): AST, ALT, ALKPHOS, BILITOT, PROT, ALBUMIN in the last 168 hours. No results for input(s): LIPASE, AMYLASE in the last 168 hours. No results for input(s): AMMONIA in the last 168 hours. Coagulation Profile: No results for input(s): INR, PROTIME in the last 168 hours. Cardiac Enzymes: No results for input(s): CKTOTAL, CKMB, CKMBINDEX, TROPONINI in the last 168 hours. BNP (last 3 results) No results for input(s): PROBNP in the last 8760 hours. HbA1C: No results for input(s): HGBA1C in the last 72 hours. CBG: No results for input(s): GLUCAP in the last 168 hours. Lipid Profile: No results for input(s): CHOL, HDL,  LDLCALC, TRIG, CHOLHDL, LDLDIRECT in the last 72 hours. Thyroid Function Tests: No results for input(s): TSH, T4TOTAL, FREET4, T3FREE, THYROIDAB in the last 72 hours. Anemia Panel: No results for input(s): VITAMINB12, FOLATE, FERRITIN, TIBC, IRON, RETICCTPCT in the last 72 hours. Urine analysis:    Component Value Date/Time   COLORURINE YELLOW 03/27/2016 0835   APPEARANCEUR CLEAR 03/27/2016 0835   LABSPEC 1.010 03/27/2016 0835   PHURINE 7.5 03/27/2016 0835   GLUCOSEU NEGATIVE 03/27/2016 0835   HGBUR SMALL (A) 03/27/2016 0835   BILIRUBINUR NEGATIVE 03/27/2016 0835   KETONESUR NEGATIVE 03/27/2016 0835   PROTEINUR 30 (A) 03/27/2016 0835   NITRITE NEGATIVE 03/27/2016 0835   LEUKOCYTESUR NEGATIVE 03/27/2016 0835   Sepsis Labs: @LABRCNTIP (procalcitonin:4,lacticidven:4) )No results found for this or any previous visit (from  the past 240 hour(s)).   Radiological Exams on Admission: Ct Cervical Spine Wo Contrast  Result Date: 06/09/2016 CLINICAL DATA:  Neck pain after falling today.  Anticoagulated. EXAM: CT CERVICAL SPINE WITHOUT CONTRAST TECHNIQUE: Multidetector CT imaging of the cervical spine was performed without intravenous contrast. Multiplanar CT image reconstructions were also generated. COMPARISON:  None. FINDINGS: Alignment: Normal. Skull base and vertebrae: No acute fracture. No primary bone lesion or focal pathologic process. Soft tissues and spinal canal: No prevertebral fluid or swelling. No visible canal hematoma. Disc levels: Multilevel degenerative cervical disc disease, greatest at C5-6 and C6-7. Facet articulations are arthritic but otherwise intact. Mild degenerative anterolisthesis at C7-T1. Upper chest: No significant abnormality Other: None IMPRESSION: Cervical degenerative disc and facet changes. Negative for acute cervical spine fracture. No acute soft tissue abnormality. Electronically Signed   By: Ellery Plunk M.D.   On: 06/09/2016 22:34   Ct Abdomen Pelvis W  Contrast  Result Date: 06/09/2016 CLINICAL DATA:  Left flank pain. Lumbar spine pain. Concern for retroperitoneal bleeding as the patient is anticoagulated for atrial fibrillation. EXAM: CT ABDOMEN AND PELVIS WITH CONTRAST TECHNIQUE: Multidetector CT imaging of the abdomen and pelvis was performed using the standard protocol following bolus administration of intravenous contrast. CONTRAST:  ISOVUE-300 IOPAMIDOL (ISOVUE-300) INJECTION 61% COMPARISON:  None. FINDINGS: Lower chest: No visible pleural or pericardial effusion. Small subpleural nodular opacity in the left lung base. Multifocal septal thickening. Hepatobiliary: Normal hepatic size and contours without focal liver lesion. No perihepatic ascites. No intra- or extrahepatic biliary dilatation. Normal gallbladder. Pancreas: Normal pancreatic contours and enhancement. No peripancreatic fluid collection or pancreatic ductal dilatation. Spleen: Normal. Adrenals/Urinary Tract: Normal adrenal glands. No hydronephrosis or solid renal mass. Stomach/Bowel: There is rectosigmoid diverticulosis without acute inflammation. No small bowel dilatation. No abdominal fluid collection. The appendix is not clearly visualized. There is no retroperitoneal hematoma. Vascular/Lymphatic: There is atherosclerotic calcification of the non aneurysmal abdominal aorta. No abdominal or pelvic adenopathy. Reproductive: Status post hysterectomy.  No adnexal mass. Musculoskeletal: Multilevel lumbar facet arthrosis and osteophyte formation. There is a compression fracture of the L1 vertebral body with approximately 25% height loss anteriorly with no stenosis-causing retropulsion. The fracture involves the superior endplate and the anterior and posterior walls. There is grade 1 anterolisthesis at L4-L5. Other: No contributory non-categorized findings. IMPRESSION: 1. Acute incomplete burst type fracture of the L1 vertebral body, involving the anterior and posterior walls and the  superior endplate. Approximately 25% height loss but only minimal retropulsion without resultant stenosis. 2. No retroperitoneal hematoma. 3. Rectosigmoid diverticulosis without acute inflammation. Electronically Signed   By: Deatra Robinson M.D.   On: 06/09/2016 22:45    EKG: Independently reviewed.  Assessment/Plan Principal Problem:   Burst fracture of lumbar vertebra, closed, initial encounter (HCC) Active Problems:   Atrial fibrillation (HCC)   HTN (hypertension)    1. Burst fx of L1, no canal stenosis - 1. Per Dr. Bevely Palmer: 1. Pain control 2. TLSO brace 3. No surgery needed 4. F/u in office for repeat films in 1 week 2. PT/OT eval and treat 2. A.Fib - 1. NSR 2. Continue amiodarone 3. Holding Xarelto for tonight due to trauma, although no evidence of hematoma to spinal cord etc. 3. HTN - 1. Continue home meds 2. Adding PRN hydralazine   DVT prophylaxis: SCDs for tonight (will skip tonights dose of xarelto given trauma) Code Status: Full Family Communication: No family in room Consults called: EDP spoke with Dr. Bevely Palmer who make recommendations as above.  Admission status: Admit to obs   Hillary BowGARDNER, Kannen Moxey M. DO [facility] Pager [phone] from 7PM-7AM  If 7AM-7PM, please contact the day physician for the patient [email or web address] Password [email or web address]  06/09/2016, 11:32 PM

## 2016-06-09 NOTE — ED Triage Notes (Signed)
Pt from home where she lives with her husband (they have some help).  Pt was taking something out of the mocrowave and stepped to the side.  Pt fell on to her bottom.  Pain in lumbar area and coccyx.  No LOC, did not hit head.  This fall occurred at 5pm and pain has become worse.  Pt has been ambulatory since fall.

## 2016-06-10 DIAGNOSIS — Z888 Allergy status to other drugs, medicaments and biological substances status: Secondary | ICD-10-CM | POA: Diagnosis not present

## 2016-06-10 DIAGNOSIS — Z8041 Family history of malignant neoplasm of ovary: Secondary | ICD-10-CM | POA: Diagnosis not present

## 2016-06-10 DIAGNOSIS — H409 Unspecified glaucoma: Secondary | ICD-10-CM | POA: Diagnosis present

## 2016-06-10 DIAGNOSIS — I5032 Chronic diastolic (congestive) heart failure: Secondary | ICD-10-CM | POA: Diagnosis present

## 2016-06-10 DIAGNOSIS — W1830XA Fall on same level, unspecified, initial encounter: Secondary | ICD-10-CM | POA: Diagnosis present

## 2016-06-10 DIAGNOSIS — I10 Essential (primary) hypertension: Secondary | ICD-10-CM | POA: Diagnosis not present

## 2016-06-10 DIAGNOSIS — I110 Hypertensive heart disease with heart failure: Secondary | ICD-10-CM | POA: Diagnosis present

## 2016-06-10 DIAGNOSIS — I480 Paroxysmal atrial fibrillation: Secondary | ICD-10-CM | POA: Diagnosis not present

## 2016-06-10 DIAGNOSIS — Y92010 Kitchen of single-family (private) house as the place of occurrence of the external cause: Secondary | ICD-10-CM | POA: Diagnosis not present

## 2016-06-10 DIAGNOSIS — I4891 Unspecified atrial fibrillation: Secondary | ICD-10-CM | POA: Diagnosis present

## 2016-06-10 DIAGNOSIS — Z9889 Other specified postprocedural states: Secondary | ICD-10-CM | POA: Diagnosis not present

## 2016-06-10 DIAGNOSIS — Z8673 Personal history of transient ischemic attack (TIA), and cerebral infarction without residual deficits: Secondary | ICD-10-CM | POA: Diagnosis not present

## 2016-06-10 DIAGNOSIS — Z8249 Family history of ischemic heart disease and other diseases of the circulatory system: Secondary | ICD-10-CM | POA: Diagnosis not present

## 2016-06-10 DIAGNOSIS — S32001A Stable burst fracture of unspecified lumbar vertebra, initial encounter for closed fracture: Secondary | ICD-10-CM | POA: Diagnosis present

## 2016-06-10 DIAGNOSIS — Z90710 Acquired absence of both cervix and uterus: Secondary | ICD-10-CM | POA: Diagnosis not present

## 2016-06-10 DIAGNOSIS — K5900 Constipation, unspecified: Secondary | ICD-10-CM | POA: Diagnosis present

## 2016-06-10 DIAGNOSIS — S32011A Stable burst fracture of first lumbar vertebra, initial encounter for closed fracture: Secondary | ICD-10-CM | POA: Diagnosis present

## 2016-06-10 DIAGNOSIS — Z79899 Other long term (current) drug therapy: Secondary | ICD-10-CM | POA: Diagnosis not present

## 2016-06-10 DIAGNOSIS — Z7901 Long term (current) use of anticoagulants: Secondary | ICD-10-CM | POA: Diagnosis not present

## 2016-06-10 DIAGNOSIS — Z9849 Cataract extraction status, unspecified eye: Secondary | ICD-10-CM | POA: Diagnosis not present

## 2016-06-10 MED ORDER — RIVAROXABAN 15 MG PO TABS
15.0000 mg | ORAL_TABLET | Freq: Every day | ORAL | Status: DC
  Administered 2016-06-11 – 2016-06-13 (×3): 15 mg via ORAL
  Filled 2016-06-10 (×3): qty 1

## 2016-06-10 MED ORDER — ONDANSETRON HCL 4 MG/2ML IJ SOLN
4.0000 mg | Freq: Four times a day (QID) | INTRAMUSCULAR | Status: DC | PRN
  Administered 2016-06-10 – 2016-06-12 (×4): 4 mg via INTRAVENOUS
  Filled 2016-06-10 (×4): qty 2

## 2016-06-10 MED ORDER — FLUTICASONE PROPIONATE 50 MCG/ACT NA SUSP
2.0000 | Freq: Every day | NASAL | Status: DC
  Administered 2016-06-10 – 2016-06-14 (×3): 2 via NASAL
  Filled 2016-06-10: qty 16

## 2016-06-10 MED ORDER — HYDROCODONE-ACETAMINOPHEN 10-325 MG PO TABS
1.0000 | ORAL_TABLET | ORAL | Status: DC | PRN
  Administered 2016-06-10: 2 via ORAL
  Administered 2016-06-11 – 2016-06-12 (×6): 1 via ORAL
  Filled 2016-06-10 (×5): qty 1
  Filled 2016-06-10: qty 2
  Filled 2016-06-10 (×2): qty 1

## 2016-06-10 NOTE — Progress Notes (Signed)
Attempted to contact Ortho regarding brace for patient.  No answer at pager [phone] or [phone].  Secretary reports to me that Ortho does not come in today until 12 noon or 1 p.m.  Will attempt to contact at that time.

## 2016-06-10 NOTE — Progress Notes (Signed)
OT Cancellation Note  Patient Details Name: [name] Jones MRN: [id number] DOB: July 27, 1923   Cancelled Treatment:    Reason Eval/Treat Not Completed: Other (comment)   Noted Neurosurgical   consult as well as brace (TLSO)  not in room- will check on pt later in day or next day.  Lise AuerLori Lorijean Husser, [place] [phone] Einar CrowEDDING, Dhanya Bogle D 06/10/2016, 10:11 AM

## 2016-06-10 NOTE — Evaluation (Signed)
Physical Therapy Evaluation Patient Details Name: Towanda Octavelizabeth W Rahilly MRN: [id number] DOB: 06-May-1924 Today's Date: 06/10/2016   History of Present Illness  82 yo female admitted after a fall and sustained  an Acute incomplete burst type fracture of the L1 vertebral body,involving the anterior and posterior walls and the superior endplate  Clinical Impression  The patient  Was able to mobilize to edge of bed. TLSO was fitted and removed as patient unable to ambulate at this time due to c/o nausea and dizziness. Patient recently took pain medication. Pt admitted with above diagnosis. Pt currently with functional limitations due to the deficits listed below (see PT Problem List).  Pt will benefit from skilled PT to increase their independence and safety with mobility to allow discharge to the venue listed below.       Follow Up Recommendations Home health PT;Supervision/Assistance - 24 hour patient's spouse has 24 hour caregivers.     Equipment Recommendations  None recommended by PT    Recommendations for Other Services       Precautions / Restrictions Precautions Precautions: Fall;Back Required Braces or Orthoses: Spinal Brace Spinal Brace: Thoracolumbosacral orthotic      Mobility  Bed Mobility Overal bed mobility: Needs Assistance Bed Mobility: Sidelying to Sit;Sit to Sidelying   Sidelying to sit: Min assist     Sit to sidelying: Min assist General bed mobility comments: cues for log rolling and  back protection, while in sitting the TLSO was placed by [employer]. The patient began to c/o nausea and dizziness. Brace was removed .  Patient stood and took 3 side steps along bed without the brace as it had been removed.  Transfers Overall transfer level: Needs assistance Equipment used: Rolling walker (2 wheeled) Transfers: Sit to/from Stand Sit to Stand: Min assist         General transfer comment: 3 side steps.   Ambulation/Gait                Stairs             Wheelchair Mobility    Modified Rankin (Stroke Patients Only)       Balance                                             Pertinent Vitals/Pain Pain Assessment: 0-10 Pain Score: 5  Pain Location: back Pain Descriptors / Indicators: Discomfort Pain Intervention(s): Premedicated before session;Repositioned    Home Living Family/patient expects to be discharged to:: Private residence Living Arrangements: Spouse/significant other;Other (Comment) Available Help at Discharge: Personal care attendant Type of Home: House Home Access: Ramped entrance     Home Layout: One level Home Equipment: Walker - 4 wheels;Walker - 2 wheels;Cane - single point Additional Comments: 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 Drives, cooks. Hires cleaning lady. uses rollator as needed but not always     Hand Dominance        Extremity/Trunk Assessment   Upper Extremity Assessment: Overall WFL for tasks assessed           Lower Extremity Assessment: Generalized weakness      Cervical / Trunk Assessment: Kyphotic  Communication   Communication: No difficulties  Cognition Arousal/Alertness: Awake/alert Behavior During Therapy:  WFL for tasks assessed/performed Overall Cognitive Status: Within Functional Limits for tasks assessed                      General Comments      Exercises     Assessment/Plan    PT Assessment Patient needs continued PT services  PT Problem List Decreased strength;Decreased activity tolerance;Decreased balance;Decreased mobility;Cardiopulmonary status limiting activity;Decreased knowledge of precautions;Decreased safety awareness;Decreased knowledge of use of DME;Pain          PT Treatment Interventions DME instruction;Gait training;Functional mobility training;Therapeutic  activities;Therapeutic exercise;Patient/family education    PT Goals (Current goals can be found in the Care Plan section)  Acute Rehab PT Goals Patient Stated Goal: to go home PT Goal Formulation: With patient Time For Goal Achievement: 06/10/16 Potential to Achieve Goals: Good    Frequency Min 5X/week   Barriers to discharge Decreased caregiver support      Co-evaluation               End of Session   Activity Tolerance: Treatment limited secondary to medical complications (Comment) (nausea and dizziness. ) Patient left: in bed;with call bell/phone within reach;with bed alarm set Nurse Communication: Mobility status    Functional Assessment Tool Used: clinical judgement Functional Limitation: Mobility: Walking and moving around Mobility: Walking and Moving Around Current Status ([id number](G8978): At least 40 percent but less than 60 percent impaired, limited or restricted Mobility: Walking and Moving Around Goal Status [phone](G8979): At least 1 percent but less than 20 percent impaired, limited or restricted    Time: 1405-1426 PT Time Calculation (min) (ACUTE ONLY): 21 min   Charges:   PT Evaluation $PT Eval Low Complexity: 1 Procedure     PT G Codes:   PT G-Codes **NOT FOR INPATIENT CLASS** Functional Assessment Tool Used: clinical judgement Functional Limitation: Mobility: Walking and moving around Mobility: Walking and Moving Around Current Status ([id number](G8978): At least 40 percent but less than 60 percent impaired, limited or restricted Mobility: Walking and Moving Around Goal Status [phone]([id number]): At least 1 percent but less than 20 percent impaired, limited or restricted    Rada HayHill, Jennilee Demarco Henessy 06/10/2016, 2:35 PM Blanchard KelchKaren Trindon Dorton PT [phone]

## 2016-06-10 NOTE — Progress Notes (Signed)
[place] to [facility]. Pt is A&Ox4. Right hand SL in place. Scattered bruising noted to arms and legs. Right lower leg has open area approx 0.5x0.5, red wound bed with scan serosang drainage. Pt states site is from skin cancer removal on Thursday. Pt oriented to room, call bed and policies. Belongings noted. Admission completed. Will continue to monitor.

## 2016-06-10 NOTE — Progress Notes (Signed)
PROGRESS NOTE                                                                                                                                                                                                             Patient Demographics:    [name] Jones, is a 82 y.o. female, DOB - 06/16/1924, ZOX:[id number]  Admit date - 06/09/2016   Admitting Physician Hillary Bow, DO  Outpatient Primary MD for the patient is ROBERTS, [name], MD  LOS - 0    Chief Complaint  Patient presents with  . Fall       Brief Narrative   [name] Jones is a 82 y.o. female with medical history significant of A.Fib rhythm controlled with amiodarone but on Xarelto, HTN. She presents with fall, workup significant for L1 burst fracture with stenosis .   Subjective:    [name] Jones today has, No headache, No chest pain, No abdominal pain - No Nausea, Reports lower back pain is improving  Assessment  & Plan :    Principal Problem:   Burst fracture of lumbar vertebra, closed, initial encounter [facility]) Active Problems:   Atrial fibrillation (HCC)   HTN (hypertension)  Burst fx of L1, no canal stenosis  - Continue with when necessary pain medication, will transition from IV to by mouth, will change IV morphine to by mouth oxycodone and reevaluate. - We discussed with neurosurgery Dr. Bevely Palmer, recommendation for TLSO, no surgery needed, follow-up with him in the office in one week for repeat imaging. - PT/OT pending  Atrial fibrillation - Normal sinus rhythm, continue with amiodarone, will resume Xarelto in a.m.Marland Kitchen  Hypertension - Cont  with home medication, and when necessary hydralazine    Code Status : Full  Family Communication  : none at [place]  Disposition Plan  : Home in 24 hours  Consults  :  None   Procedures  : None  DVT Prophylaxis  :   SCDs   Lab Results  Component Value Date   PLT 195 03/30/2016    Antibiotics  :    Anti-infectives    None        Objective:   Vitals:   06/09/16 2330 06/10/16 0000 06/10/16 0106 06/10/16 0542  BP: (!) 204/76 177/71 (!) 172/68 139/63  Pulse: 72 68 71 64  Resp:  18 17  Temp:   98.7 F (37.1 C) 98.5 F (36.9 C)  TempSrc:   Oral Oral  SpO2: 98% 99% 100% 99%  Weight:   57.6 kg (126 lb 15.8 oz)   Height:   5' (1.524 m)     Wt Readings from Last 3 Encounters:  06/10/16 57.6 kg (126 lb 15.8 oz)  04/20/16 53.1 kg (117 lb)  03/30/16 57.1 kg (125 lb 14.1 oz)     Intake/Output Summary (Last 24 hours) at 06/10/16 1216 Last data filed at 06/10/16 0851  Gross per 24 hour  Intake              420 ml  Output                0 ml  Net              420 ml     Physical Exam  Awake Alert, Oriented X 3,  [place].AT,PERRAL Supple Neck,No JVD, No cervical lymphadenopathy appriciated.  Symmetrical Chest wall movement, Good air movement bilaterally, CTAB RRR,No Gallops,Rubs or new Murmurs, No Parasternal Heave +ve B.Sounds, Abd Soft, No tenderness,  No rebound - guarding or rigidity. No Cyanosis, Clubbing or edema, No new Rash or bruise     Data Review:    CBC  Recent Labs Lab 06/09/16 2031  HGB 12.2  HCT 36.0    Chemistries   Recent Labs Lab 06/09/16 2031  NA 135  K 3.8  CL 98*  GLUCOSE 131*  BUN 23*  CREATININE 1.00   ------------------------------------------------------------------------------------------------------------------ No results for input(s): CHOL, HDL, LDLCALC, TRIG, CHOLHDL, LDLDIRECT in the last 72 hours.  No results found for: HGBA1C ------------------------------------------------------------------------------------------------------------------ No results for input(s): TSH, T4TOTAL, T3FREE, THYROIDAB in the last 72 hours.  Invalid input(s): FREET3 ------------------------------------------------------------------------------------------------------------------ No results for input(s): VITAMINB12, FOLATE, FERRITIN, TIBC,  IRON, RETICCTPCT in the last 72 hours.  Coagulation profile No results for input(s): INR, PROTIME in the last 168 hours.  No results for input(s): DDIMER in the last 72 hours.  Cardiac Enzymes No results for input(s): CKMB, TROPONINI, MYOGLOBIN in the last 168 hours.  Invalid input(s): CK ------------------------------------------------------------------------------------------------------------------    Component Value Date/Time   BNP 181.2 (H) 03/27/2016 0851    Inpatient Medications  Scheduled Meds: . amiodarone  200 mg Oral Q lunch  . calcium carbonate  1 tablet Oral QPM  . furosemide  40 mg Oral BID  . latanoprost  1 drop Both Eyes QHS  . metoprolol succinate  25 mg Oral Jones  . multivitamin with minerals  1 tablet Oral QPM  . potassium chloride  10 mEq Oral BID  . saccharomyces boulardii  250 mg Oral BID  . timolol  1 drop Both Eyes BID   Continuous Infusions: PRN Meds:.acetaminophen, dextromethorphan-guaiFENesin, hydrALAZINE, HYDROcodone-acetaminophen, morphine injection  Micro Results No results found for this or any previous visit (from the past 240 hour(s)).  Radiology Reports Ct Cervical Spine Wo Contrast  Result Date: 06/09/2016 CLINICAL DATA:  Neck pain after falling today.  Anticoagulated. EXAM: CT CERVICAL SPINE WITHOUT CONTRAST TECHNIQUE: Multidetector CT imaging of the cervical spine was performed without intravenous contrast. Multiplanar CT image reconstructions were also generated. COMPARISON:  None. FINDINGS: Alignment: Normal. Skull base and vertebrae: No acute fracture. No primary bone lesion or focal pathologic process. Soft tissues and spinal canal: No prevertebral fluid or swelling. No visible canal hematoma. Disc levels: Multilevel degenerative cervical disc disease, greatest at C5-6 and C6-7. Facet articulations are arthritic but otherwise intact.  Mild degenerative anterolisthesis at C7-T1. Upper chest: No significant abnormality Other: None  IMPRESSION: Cervical degenerative disc and facet changes. Negative for acute cervical spine fracture. No acute soft tissue abnormality. Electronically Signed   By: Ellery Plunkaniel R Mitchell M.D.   On: 06/09/2016 22:34   Ct Abdomen Pelvis W Contrast  Result Date: 06/09/2016 CLINICAL DATA:  Left flank pain. Lumbar spine pain. Concern for retroperitoneal bleeding as the patient is anticoagulated for atrial fibrillation. EXAM: CT ABDOMEN AND PELVIS WITH CONTRAST TECHNIQUE: Multidetector CT imaging of the abdomen and pelvis was performed using the standard protocol following bolus administration of intravenous contrast. CONTRAST:  100mL ISOVUE-300 IOPAMIDOL (ISOVUE-300) INJECTION 61% COMPARISON:  None. FINDINGS: Lower chest: No visible pleural or pericardial effusion. Small subpleural nodular opacity in the left lung base. Multifocal septal thickening. Hepatobiliary: Normal hepatic size and contours without focal liver lesion. No perihepatic ascites. No intra- or extrahepatic biliary dilatation. Normal gallbladder. Pancreas: Normal pancreatic contours and enhancement. No peripancreatic fluid collection or pancreatic ductal dilatation. Spleen: Normal. Adrenals/Urinary Tract: Normal adrenal glands. No hydronephrosis or solid renal mass. Stomach/Bowel: There is rectosigmoid diverticulosis without acute inflammation. No small bowel dilatation. No abdominal fluid collection. The appendix is not clearly visualized. There is no retroperitoneal hematoma. Vascular/Lymphatic: There is atherosclerotic calcification of the non aneurysmal abdominal aorta. No abdominal or pelvic adenopathy. Reproductive: Status post hysterectomy.  No adnexal mass. Musculoskeletal: Multilevel lumbar facet arthrosis and osteophyte formation. There is a compression fracture of the L1 vertebral body with approximately 25% height loss anteriorly with no stenosis-causing retropulsion. The fracture involves the superior endplate and the anterior and posterior  walls. There is grade 1 anterolisthesis at L4-L5. Other: No contributory non-categorized findings. IMPRESSION: 1. Acute incomplete burst type fracture of the L1 vertebral body, involving the anterior and posterior walls and the superior endplate. Approximately 25% height loss but only minimal retropulsion without resultant stenosis. 2. No retroperitoneal hematoma. 3. Rectosigmoid diverticulosis without acute inflammation. Electronically Signed   By: Deatra RobinsonKevin  Herman M.D.   On: 06/09/2016 22:45      [name] Jones M.D on 06/10/2016 at 12:16 PM  Between 7am to 7pm - Pager - [phone]  After 7pm go to [email or web address] - password [email or web address]  [facility] -  Office  [phone]

## 2016-06-11 MED ORDER — HYDROCODONE-ACETAMINOPHEN 10-325 MG PO TABS: 2.0000 | ORAL_TABLET | Freq: Four times a day (QID) | ORAL | 0 refills | Status: DC | PRN

## 2016-06-11 MED ORDER — METHOCARBAMOL 500 MG PO TABS
500.0000 mg | ORAL_TABLET | Freq: Four times a day (QID) | ORAL | Status: DC | PRN
  Administered 2016-06-11 – 2016-06-12 (×4): 500 mg via ORAL
  Filled 2016-06-11 (×4): qty 1

## 2016-06-11 MED ORDER — MORPHINE SULFATE (PF) 2 MG/ML IV SOLN
1.0000 mg | INTRAVENOUS | Status: DC | PRN
  Administered 2016-06-11 – 2016-06-12 (×3): 2 mg via INTRAVENOUS
  Filled 2016-06-11 (×3): qty 1

## 2016-06-11 NOTE — Discharge Summary (Deleted)
[name] Jones, is a 82 y.o. female  DOB May 07, 1924  MRN [id number].  Admission date:  06/09/2016  Admitting Physician  Hillary Bow, DO  Discharge Date:  06/11/2016   Primary MD  Lorenda Peck, MD  Recommendations for primary care physician for things to follow:  - Continue PT/OT as an outpatient. - Follow with neurosurgery in 1-2 weeks as an outpatient  Admission Diagnosis  Closed burst fracture of lumbar vertebra, initial encounter [facility]) [S32.001A]   Discharge Diagnosis  Closed burst fracture of lumbar vertebra, initial encounter (HCC) [S32.001A]    Principal Problem:   Burst fracture of lumbar vertebra, closed, initial encounter [facility]) Active Problems:   Atrial fibrillation (HCC)   HTN (hypertension)      Past Medical History:  Diagnosis Date  . Atrial fibrillation (HCC)   . Chronic diastolic CHF (congestive heart failure) (HCC)   . Glaucoma   . Hypertension   . Hyponatremia   . Iron deficiency anemia   . Mitral insufficiency   . TIA (transient ischemic attack) 1997  . Tricuspid insufficiency     Past Surgical History:  Procedure Laterality Date  . ABDOMINAL HYSTERECTOMY    . CARDIOVERSION N/A 09/15/2015   Procedure: CARDIOVERSION;  Surgeon: Wendall Stade, MD;  Location: [facility]COPY;  Service: Cardiovascular;  Laterality: N/A;  . CATARACT EXTRACTION    . DIAGNOSTIC LAPAROSCOPY    . STRABISMUS SURGERY    . TONSILLECTOMY         History of present illness and  Hospital Course:     Kindly see H&P for history of present illness and admission details, please review complete Labs, Consult reports and Test reports for all details in brief  HPI  from the history and physical done on the day of admission 06/09/2016  HPI: [name] Jones is a 82 y.o. female with medical history significant of A.Fib rhythm controlled with amiodarone but on Xarelto, HTN.  Patient was  in usual state of health until she slipped while taking food out of a microwave today at home.  Fell landing on buttocks.  Having back pain since fall.  Pain has been persistent since onset at time of fall earlier today.  Tried nothing for pain at home.  No focal weakness or numbness.  Presents to the ED for evaluation.  She has been ambulatory since the fall.  ED Course: L1 burst fx, 25% height loss, minimal retropulsion, no canal stenosis.  Hospital Course  [name] Renovato Smithis a 82 y.o.femalewith medical history significant of A.Fib rhythm controlled with amiodarone but on Xarelto, HTN. She presents with fall, workup significant for L1 burst fracture without stenosis   Burst fx of L1, no canal stenosis  - ED discussed with neurosurgery Dr. Bevely Palmer, recommendation for TLSO, no surgery needed, follow-up with him in the office in 1-2 week for repeat imaging. - PT/OT as outpatient, TLSO provided, discharged on when necessary Vicodin.  Atrial fibrillation - Normal sinus rhythm, continue with amiodarone, will resume Xarelto on discharge  Hypertension -  Cont  with home medication, and when necessary hydralazine    Discharge Condition:  stable   Follow UP  Follow-up Information    ROBERTS, [name], MD Follow up in 1 week(s).   Specialty:  Internal Medicine Contact information: [address] [place] [address] [phone]        Loura Halt Ditty, MD Follow up in 2 week(s).   Specialty:  Neurosurgery Why:  L1 FRACTURE Contact information: [address] [place] [address] [phone]             Discharge Instructions  and  Discharge Medications     Discharge Instructions    Diet - low sodium heart healthy    Complete by:  As directed        Medication List    TAKE these medications   acetaminophen 325 MG tablet Commonly known as:  TYLENOL Take 650 mg by mouth at bedtime as needed for moderate pain.   amiodarone 200 MG  tablet Commonly known as:  PACERONE Take 1 tablet (200 mg total) by mouth daily. What changed:  when to take this   calcium carbonate 1250 (500 Ca) MG tablet Commonly known as:  OS-CAL - dosed in mg of elemental calcium Take 1 tablet by mouth every evening.   carteolol 1 % ophthalmic solution Commonly known as:  OCUPRESS Place 1 drop into both eyes 2 (two) times daily.   furosemide 40 MG tablet Commonly known as:  LASIX Take 1 tablet (40 mg total) by mouth 2 (two) times daily.   HYDROcodone-acetaminophen 10-325 MG tablet Commonly known as:  NORCO Take 2 tablets by mouth every 6 (six) hours as needed for severe pain.   KLOR-CON M10 10 MEQ tablet Generic drug:  potassium chloride TAKE 1 TABLET (10 MEQ TOTAL) BY MOUTH 2 (TWO) TIMES DAILY.   latanoprost 0.005 % ophthalmic solution Commonly known as:  XALATAN Place 1 drop into both eyes at bedtime.   metoprolol succinate 25 MG 24 hr tablet Commonly known as:  TOPROL-XL Take 1 tablet (25 mg total) by mouth daily.   multivitamin with minerals Tabs tablet Take 1 tablet by mouth every evening.   mupirocin ointment 2 % Commonly known as:  BACTROBAN Place 1 application into the nose daily.   Rivaroxaban 15 MG Tabs tablet Commonly known as:  XARELTO Take 1 tablet (15 mg total) by mouth daily with supper.   saccharomyces boulardii 250 MG capsule Commonly known as:  FLORASTOR Take 1 capsule (250 mg total) by mouth 2 (two) times daily.   vitamin C 500 MG tablet Commonly known as:  ASCORBIC ACID Take 500 mg by mouth daily with lunch.         Diet and Activity recommendation: See Discharge Instructions above   Consults obtained -  none   Major procedures and Radiology Reports - PLEASE review detailed and final reports for all details, in brief -     Ct Cervical Spine Wo Contrast  Result Date: 06/09/2016 CLINICAL DATA:  Neck pain after falling today.  Anticoagulated. EXAM: CT CERVICAL SPINE WITHOUT CONTRAST  TECHNIQUE: Multidetector CT imaging of the cervical spine was performed without intravenous contrast. Multiplanar CT image reconstructions were also generated. COMPARISON:  None. FINDINGS: Alignment: Normal. Skull base and vertebrae: No acute fracture. No primary bone lesion or focal pathologic process. Soft tissues and spinal canal: No prevertebral fluid or swelling. No visible canal hematoma. Disc levels: Multilevel degenerative cervical disc disease, greatest at C5-6 and  C6-7. Facet articulations are arthritic but otherwise intact. Mild degenerative anterolisthesis at C7-T1. Upper chest: No significant abnormality Other: None IMPRESSION: Cervical degenerative disc and facet changes. Negative for acute cervical spine fracture. No acute soft tissue abnormality. Electronically Signed   By: Ellery Plunkaniel R Mitchell M.D.   On: 06/09/2016 22:34   Ct Abdomen Pelvis W Contrast  Result Date: 06/09/2016 CLINICAL DATA:  Left flank pain. Lumbar spine pain. Concern for retroperitoneal bleeding as the patient is anticoagulated for atrial fibrillation. EXAM: CT ABDOMEN AND PELVIS WITH CONTRAST TECHNIQUE: Multidetector CT imaging of the abdomen and pelvis was performed using the standard protocol following bolus administration of intravenous contrast. CONTRAST:  100mL ISOVUE-300 IOPAMIDOL (ISOVUE-300) INJECTION 61% COMPARISON:  None. FINDINGS: Lower chest: No visible pleural or pericardial effusion. Small subpleural nodular opacity in the left lung base. Multifocal septal thickening. Hepatobiliary: Normal hepatic size and contours without focal liver lesion. No perihepatic ascites. No intra- or extrahepatic biliary dilatation. Normal gallbladder. Pancreas: Normal pancreatic contours and enhancement. No peripancreatic fluid collection or pancreatic ductal dilatation. Spleen: Normal. Adrenals/Urinary Tract: Normal adrenal glands. No hydronephrosis or solid renal mass. Stomach/Bowel: There is rectosigmoid diverticulosis without acute  inflammation. No small bowel dilatation. No abdominal fluid collection. The appendix is not clearly visualized. There is no retroperitoneal hematoma. Vascular/Lymphatic: There is atherosclerotic calcification of the non aneurysmal abdominal aorta. No abdominal or pelvic adenopathy. Reproductive: Status post hysterectomy.  No adnexal mass. Musculoskeletal: Multilevel lumbar facet arthrosis and osteophyte formation. There is a compression fracture of the L1 vertebral body with approximately 25% height loss anteriorly with no stenosis-causing retropulsion. The fracture involves the superior endplate and the anterior and posterior walls. There is grade 1 anterolisthesis at L4-L5. Other: No contributory non-categorized findings. IMPRESSION: 1. Acute incomplete burst type fracture of the L1 vertebral body, involving the anterior and posterior walls and the superior endplate. Approximately 25% height loss but only minimal retropulsion without resultant stenosis. 2. No retroperitoneal hematoma. 3. Rectosigmoid diverticulosis without acute inflammation. Electronically Signed   By: Deatra RobinsonKevin  Herman M.D.   On: 06/09/2016 22:45    Micro Results     No results found for this or any previous visit (from the past 240 hour(s)).     Today   Subjective:   Ozella Rockslizabeth Zobrist today has no headache,no chest or abdominal pain, complains of lower back pain on ambulation . Objective:   Blood pressure (!) 123/56, pulse 60, temperature 98.4 F (36.9 C), temperature source Oral, resp. rate 16, height 5' (1.524 m), weight 57.6 kg (126 lb 15.8 oz), SpO2 97 %.   Intake/Output Summary (Last 24 hours) at 06/11/16 1212 Last data filed at 06/11/16 0905  Gross per 24 hour  Intake              540 ml  Output                0 ml  Net              540 ml    Exam Awake Alert, Oriented x 3, No new F.N deficits, Normal affect Supple Neck,No JVD, No cervical lymphadenopathy appriciated.  Symmetrical Chest wall movement, Good air  movement bilaterally, CTAB RRR,No Gallops,Rubs or new Murmurs, No Parasternal Heave +ve B.Sounds, Abd Soft, Non tender,  No Cyanosis, Clubbing or edema, No new Rash or bruise  Data Review   CBC w Diff: Lab Results  Component Value Date   WBC 8.2 03/30/2016   HGB 12.2 06/09/2016   HCT 36.0 06/09/2016  PLT 195 03/30/2016   LYMPHOPCT 4 03/27/2016   MONOPCT 5 03/27/2016   EOSPCT 2 03/27/2016   BASOPCT 0 03/27/2016    CMP: Lab Results  Component Value Date   NA 135 06/09/2016   K 3.8 06/09/2016   CL 98 (L) 06/09/2016   CO2 27 03/30/2016   BUN 23 (H) 06/09/2016   CREATININE 1.00 06/09/2016   CREATININE 0.93 (H) 03/22/2016   PROT 7.0 03/28/2016   ALBUMIN 4.3 03/28/2016   BILITOT 0.5 03/28/2016   ALKPHOS 75 03/28/2016   AST 31 03/28/2016   ALT 29 03/28/2016  .   Total Time in preparing paper work, data evaluation and todays exam - 35 minutes  ELGERGAWY, DAWOOD M.D on 06/11/2016 at 12:12 PM  [facility]   Office  [phone]

## 2016-06-11 NOTE — Progress Notes (Signed)
PROGRESS NOTE                                                                                                                                                                                                             Patient Demographics:    Ozella Rockslizabeth Lobato, is a 82 y.o. female, DOB - [birth year], ZOX:[id number]  Admit date - 06/09/2016   Admitting Physician Hillary BowJared M Gardner, DO  Outpatient Primary MD for the patient is ROBERTS, Vernie AmmonsONALD WAYNE, MD  LOS - 1    Chief Complaint  Patient presents with  . Fall       Brief Narrative   Towanda Octavelizabeth W Panuco is a 82 y.o. female with medical history significant of A.Fib rhythm controlled with amiodarone but on Xarelto, HTN. She presents with fall, workup significant for L1 burst fracture with stenosis .   Subjective:    Ozella RocksElizabeth Yaden today has, No headache, No chest pain, No abdominal pain - No Nausea, Pain controlled at rest, but upon ambulation complaining of significant pain required IV morphine.  Assessment  & Plan :    Principal Problem:   Burst fracture of lumbar vertebra, closed, initial encounter (HCC) Active Problems:   Atrial fibrillation (HCC)   HTN (hypertension)  Burst fx of L1, no canal stenosis  - Continue with when necessary pain medication, Position to by mouth Vicodin, thickened pain with PT/OT this morning requiring IV morphine. - ED discussed with neurosurgery Dr. Bevely Palmeritty, recommendation for TLSO, no surgery needed, follow-up with him in the office in one week for repeat imaging. - PT/OT following  Atrial fibrillation - Normal sinus rhythm, continue with amiodarone,resumed on Xarelto   Hypertension - Cont  with home medication, and when necessary hydralazine    Code Status : Full  Family Communication  : D/W HCPOA via phone  Disposition Plan  : Very likely will need SNF placement.  Consults  :  None   Procedures  : None  DVT Prophylaxis  :   SCDs   Lab Results    Component Value Date   PLT 195 03/30/2016    Antibiotics  :   Anti-infectives    None        Objective:   Vitals:   06/10/16 0542 06/10/16 1345 06/10/16 2135 06/11/16 0416  BP: 139/63 (!) 119/59 (!) 160/64 (!) 123/56  Pulse:  64 (!) 58 (!) 59 60  Resp: 17 18 18 16   Temp: 98.5 F (36.9 C) 98.2 F (36.8 C) 98.4 F (36.9 C) 98.4 F (36.9 C)  TempSrc: Oral Oral Oral Oral  SpO2: 99% 100% 97% 97%  Weight:      Height:        Wt Readings from Last 3 Encounters:  06/10/16 57.6 kg (126 lb 15.8 oz)  04/20/16 53.1 kg (117 lb)  03/30/16 57.1 kg (125 lb 14.1 oz)     Intake/Output Summary (Last 24 hours) at 06/11/16 1409 Last data filed at 06/11/16 0905  Gross per 24 hour  Intake              540 ml  Output                0 ml  Net              540 ml     Physical Exam  Awake Alert, Oriented X 3,  [place].AT,PERRAL Supple Neck,No JVD, No cervical lymphadenopathy appriciated.  Symmetrical Chest wall movement, Good air movement bilaterally, CTAB RRR,No Gallops,Rubs or new Murmurs, No Parasternal Heave +ve B.Sounds, Abd Soft, Lower back tenderness,  No rebound - guarding or rigidity. No Cyanosis, Clubbing or edema, No new Rash or bruise     Data Review:    CBC  Recent Labs Lab 06/09/16 2031  HGB 12.2  HCT 36.0    Chemistries   Recent Labs Lab 06/09/16 2031  NA 135  K 3.8  CL 98*  GLUCOSE 131*  BUN 23*  CREATININE 1.00   ------------------------------------------------------------------------------------------------------------------ No results for input(s): CHOL, HDL, LDLCALC, TRIG, CHOLHDL, LDLDIRECT in the last 72 hours.  No results found for: HGBA1C ------------------------------------------------------------------------------------------------------------------ No results for input(s): TSH, T4TOTAL, T3FREE, THYROIDAB in the last 72 hours.  Invalid input(s):  FREET3 ------------------------------------------------------------------------------------------------------------------ No results for input(s): VITAMINB12, FOLATE, FERRITIN, TIBC, IRON, RETICCTPCT in the last 72 hours.  Coagulation profile No results for input(s): INR, PROTIME in the last 168 hours.  No results for input(s): DDIMER in the last 72 hours.  Cardiac Enzymes No results for input(s): CKMB, TROPONINI, MYOGLOBIN in the last 168 hours.  Invalid input(s): CK ------------------------------------------------------------------------------------------------------------------    Component Value Date/Time   BNP 181.2 (H) 03/27/2016 0851    Inpatient Medications  Scheduled Meds: . amiodarone  200 mg Oral Q lunch  . calcium carbonate  1 tablet Oral QPM  . fluticasone  2 spray Each Nare Daily  . furosemide  40 mg Oral BID  . latanoprost  1 drop Both Eyes QHS  . metoprolol succinate  25 mg Oral Daily  . multivitamin with minerals  1 tablet Oral QPM  . potassium chloride  10 mEq Oral BID  . Rivaroxaban  15 mg Oral Q supper  . saccharomyces boulardii  250 mg Oral BID  . timolol  1 drop Both Eyes BID   Continuous Infusions: PRN Meds:.acetaminophen, dextromethorphan-guaiFENesin, hydrALAZINE, HYDROcodone-acetaminophen, morphine injection, ondansetron (ZOFRAN) IV  Micro Results No results found for this or any previous visit (from the past 240 hour(s)).  Radiology Reports Ct Cervical Spine Wo Contrast  Result Date: 06/09/2016 CLINICAL DATA:  Neck pain after falling today.  Anticoagulated. EXAM: CT CERVICAL SPINE WITHOUT CONTRAST TECHNIQUE: Multidetector CT imaging of the cervical spine was performed without intravenous contrast. Multiplanar CT image reconstructions were also generated. COMPARISON:  None. FINDINGS: Alignment: Normal. Skull base and vertebrae: No acute fracture. No primary bone lesion or focal pathologic process. Soft tissues  and spinal canal: No prevertebral  fluid or swelling. No visible canal hematoma. Disc levels: Multilevel degenerative cervical disc disease, greatest at C5-6 and C6-7. Facet articulations are arthritic but otherwise intact. Mild degenerative anterolisthesis at C7-T1. Upper chest: No significant abnormality Other: None IMPRESSION: Cervical degenerative disc and facet changes. Negative for acute cervical spine fracture. No acute soft tissue abnormality. Electronically Signed   By: Ellery Plunk M.D.   On: 06/09/2016 22:34   Ct Abdomen Pelvis W Contrast  Result Date: 06/09/2016 CLINICAL DATA:  Left flank pain. Lumbar spine pain. Concern for retroperitoneal bleeding as the patient is anticoagulated for atrial fibrillation. EXAM: CT ABDOMEN AND PELVIS WITH CONTRAST TECHNIQUE: Multidetector CT imaging of the abdomen and pelvis was performed using the standard protocol following bolus administration of intravenous contrast. CONTRAST:  ISOVUE-300 IOPAMIDOL (ISOVUE-300) INJECTION 61% COMPARISON:  None. FINDINGS: Lower chest: No visible pleural or pericardial effusion. Small subpleural nodular opacity in the left lung base. Multifocal septal thickening. Hepatobiliary: Normal hepatic size and contours without focal liver lesion. No perihepatic ascites. No intra- or extrahepatic biliary dilatation. Normal gallbladder. Pancreas: Normal pancreatic contours and enhancement. No peripancreatic fluid collection or pancreatic ductal dilatation. Spleen: Normal. Adrenals/Urinary Tract: Normal adrenal glands. No hydronephrosis or solid renal mass. Stomach/Bowel: There is rectosigmoid diverticulosis without acute inflammation. No small bowel dilatation. No abdominal fluid collection. The appendix is not clearly visualized. There is no retroperitoneal hematoma. Vascular/Lymphatic: There is atherosclerotic calcification of the non aneurysmal abdominal aorta. No abdominal or pelvic adenopathy. Reproductive: Status post hysterectomy.  No adnexal mass.  Musculoskeletal: Multilevel lumbar facet arthrosis and osteophyte formation. There is a compression fracture of the L1 vertebral body with approximately 25% height loss anteriorly with no stenosis-causing retropulsion. The fracture involves the superior endplate and the anterior and posterior walls. There is grade 1 anterolisthesis at L4-L5. Other: No contributory non-categorized findings. IMPRESSION: 1. Acute incomplete burst type fracture of the L1 vertebral body, involving the anterior and posterior walls and the superior endplate. Approximately 25% height loss but only minimal retropulsion without resultant stenosis. 2. No retroperitoneal hematoma. 3. Rectosigmoid diverticulosis without acute inflammation. Electronically Signed   By: Deatra Robinson M.D.   On: 06/09/2016 22:45      Paquita Printy M.D on 06/11/2016 at 2:09 PM  Between 7am to 7pm - Pager - [phone]  After 7pm go to [email or web address] - password [facility]  [facility] -  Office  [phone]

## 2016-06-11 NOTE — NC FL2 (Signed)
[place] MEDICAID FL2 LEVEL OF CARE SCREENING TOOL     IDENTIFICATION  Patient Name: [name] Jones Birthdate: [birth year] Sex: female Admission Date (Current Location): 06/09/2016  [place]   Facility and Address:  [facility],  [address], [place] [id number]      Provider Number: [id number]  Attending Physician Name and Address:  Starleen Armsawood S Elgergawy, MD  Relative Name and Phone Number:    Kelton PillarCarolyn Laramee   [phone]    Current Level of Care:  Hospital  Recommended Level of Care: Skilled Nursing Facility Prior Approval Number:    Date Approved/Denied:   PASRR Number:   [id number]   Discharge Plan: SNF    Current Diagnoses: Patient Active Problem List   Diagnosis Date Noted  . Burst fracture of lumbar vertebra, closed, initial encounter (HCC) 06/09/2016  . CAP (community acquired pneumonia) 03/27/2016  . Bradycardia by electrocardiogram 09/21/2015  . Chronic diastolic CHF (congestive heart failure) (HCC) 09/05/2015  . Atrial fibrillation (HCC) 09/05/2015  . HTN (hypertension) 09/05/2015  . Hyponatremia 09/05/2015    Orientation RESPIRATION BLADDER Height & Weight     Self, Time, Situation, Place  Normal Continent Weight: 126 lb 15.8 oz (57.6 kg) Height:  5' (152.4 cm)  BEHAVIORAL SYMPTOMS/MOOD NEUROLOGICAL BOWEL NUTRITION STATUS      Continent Diet (Low Sodium Heart Healthy)  AMBULATORY STATUS COMMUNICATION OF NEEDS Skin   Extensive Assist Verbally Other (Comment) (Incision- Right Leg, change dressing once a shift)                       Personal Care Assistance Level of Assistance  Bathing, Feeding, Dressing Bathing Assistance: Limited assistance Feeding assistance: Independent Dressing Assistance: Limited assistance     Functional Limitations Info  Sight, Hearing, Speech Sight Info: Impaired Hearing Info: Adequate Speech Info: Adequate    SPECIAL CARE FACTORS FREQUENCY  PT (By licensed PT),  OT (By licensed OT)     PT Frequency: 5 OT Frequency: 5            Contractures Contractures Info: Not present    Additional Factors Info  Code Status, Allergies Code Status Info: FULL Allergies Info: Lisinopril           Current Medications (06/11/2016):  This is the current hospital active medication list Current Facility-Administered Medications  Medication Dose Route Frequency Provider Last Rate Last Dose  . acetaminophen (TYLENOL) tablet 650 mg  650 mg Oral QHS PRN Hillary BowJared M Gardner, DO   650 mg at 06/11/16 0630  . amiodarone (PACERONE) tablet 200 mg  200 mg Oral Q lunch Hillary BowJared M Gardner, DO   200 mg at 06/11/16 1348  . calcium carbonate (OS-CAL - dosed in mg of elemental calcium) tablet 500 mg of elemental calcium  1 tablet Oral QPM Hillary BowJared M Gardner, DO      . dextromethorphan-guaiFENesin (ROBITUSSIN-DM) 10-100 MG/5ML liquid 5 mL  5 mL Oral Q4H PRN Hillary BowJared M Gardner, DO      . fluticasone (FLONASE) 50 MCG/ACT nasal spray 2 spray  2 spray Each Nare Daily Starleen Armsawood S Elgergawy, MD   2 spray at 06/11/16 1000  . furosemide (LASIX) tablet 40 mg  40 mg Oral BID Hillary BowJared M Gardner, DO   40 mg at 06/11/16 [id number]  . hydrALAZINE (APRESOLINE) injection 10-20 mg  10-20 mg Intravenous Q4H PRN Hillary BowJared M Gardner, DO      . HYDROcodone-acetaminophen (NORCO) 10-325 MG per tablet 1-2 tablet  1-2 tablet Oral Q4H PRN Starleen Armsawood S Elgergawy, MD   1 tablet at 06/11/16 1313  . latanoprost (XALATAN) 0.005 % ophthalmic solution 1 drop  1 drop Both Eyes QHS Hillary BowJared M Gardner, DO   1 drop at 06/10/16 2058  . metoprolol succinate (TOPROL-XL) 24 hr tablet 25 mg  25 mg Oral Daily Hillary BowJared M Gardner, DO   25 mg at 06/11/16 [id number]  . morphine 2 MG/ML injection 1-2 mg  1-2 mg Intravenous Q4H PRN Starleen Armsawood S Elgergawy, MD      . multivitamin with minerals tablet 1 tablet  1 tablet Oral QPM Hillary BowJared M Gardner, DO      . ondansetron [facility](ZOFRAN) injection 4 mg  4 mg Intravenous Q6H PRN Starleen Armsawood S Elgergawy, MD   4 mg at 06/11/16 1125  . potassium  chloride (K-DUR,KLOR-CON) CR tablet 10 mEq  10 mEq Oral BID Hillary BowJared M Gardner, DO   10 mEq at 06/11/16 [id number]  . Rivaroxaban (XARELTO) tablet 15 mg  15 mg Oral Q supper Starleen Armsawood S Elgergawy, MD      . saccharomyces boulardii (FLORASTOR) capsule 250 mg  250 mg Oral BID Hillary BowJared M Gardner, DO   250 mg at 06/11/16 [id number]  . timolol (TIMOPTIC) 0.5 % ophthalmic solution 1 drop  1 drop Both Eyes BID Hillary BowJared M Gardner, DO   1 drop at 06/11/16 1000     Discharge Medications: Please see discharge summary for a list of discharge medications.  Relevant Imaging Results:  Relevant Lab Results:   Additional Information ss#[phone]  Clearance CootsNicole A Jesus Nevills, LCSW

## 2016-06-11 NOTE — Progress Notes (Signed)
Physical Therapy Treatment Patient Details Name: [name] Jones MRN: [id number] DOB: 1923-08-20 Today's Date: 06/11/2016    History of Present Illness 82 yo female admitted after a fall and sustained  an Acute incomplete burst type fracture of the L1 vertebral body,involving the anterior and posterior walls and the superior endplate    PT Comments    Pt reports only taking tylenol for pain this morning due to nausea with pain meds yesterday and wished to perform mobility.  Pt assisted OOB to recliner however unable to tolerate any further mobility at this time due to pain.  Pt requested trying half a pill of her pain meds and recommended she discuss pain meds with RN (pt to call out using call bell and RN notified upon leaving room by PT) as she will need to be able to tolerate mobility for even ADLs once at home.   Follow Up Recommendations  Home health PT;Supervision/Assistance - 24 hour     Equipment Recommendations  None recommended by PT    Recommendations for Other Services       Precautions / Restrictions Precautions Precautions: Fall;Back Required Braces or Orthoses: Spinal Brace Spinal Brace: Thoracolumbosacral orthotic    Mobility  Bed Mobility Overal bed mobility: Needs Assistance Bed Mobility: Rolling;Sidelying to Sit Rolling: Min guard Sidelying to sit: Min assist   Sit to supine: Min assist Sit to sidelying: Min assist General bed mobility comments: verbal cues for log roll technique, pt able to position to sidelying with cues, required assist for heels over EOB and slight assist for trunk upright  Transfers Overall transfer level: Needs assistance Equipment used: Rolling walker (2 wheeled) Transfers: Sit to/from [employer] CorporationStand;Stand Pivot Transfers Sit to Stand: Min guard Stand pivot transfers: Min guard       General transfer comment: demonstrated safe technique for pt prior to performing sit to stand, brace was donned EOB, pt required increased time due to  pain, cues for UEs through RW  Ambulation/Gait             General Gait Details: unable due to pain at this time   Stairs            Wheelchair Mobility    Modified Rankin (Stroke Patients Only)       Balance                                    Cognition Arousal/Alertness: Awake/alert Behavior During Therapy: WFL for tasks assessed/performed Overall Cognitive Status: Within Functional Limits for tasks assessed                      Exercises      General Comments        Pertinent Vitals/Pain Pain Assessment: 0-10 Pain Score: 9  Pain Location: back Pain Descriptors / Indicators: Aching;Discomfort;Sore Pain Intervention(s): Limited activity within patient's tolerance;Monitored during session;Patient requesting pain meds-RN notified    Home Living Family/patient expects to be discharged to:: Private residence Living Arrangements: Spouse/significant other;Other (Comment) Available Help at Discharge: Personal care attendant Type of Home: House Home Access: Ramped entrance   Home Layout: One level Home Equipment: Walker - 4 wheels;Walker - 2 wheels;Cane - single point Additional Comments: 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 Drives, cooks. Hires cleaning  lady. uses rollator as needed but not always   PT Goals (current goals can now be found in the care plan section) Acute Rehab PT Goals Patient Stated Goal: to go home Progress towards PT goals: Progressing toward goals    Frequency    Min 5X/week      PT Plan Current plan remains appropriate    Co-evaluation             End of Session Equipment Utilized During Treatment: Back brace Activity Tolerance: Patient limited by pain Patient left: in chair;with call bell/phone within reach;with chair alarm set     Time: [id number] PT Time  Calculation (min) (ACUTE ONLY): 18 min  Charges:  $Therapeutic Activity: 8-22 mins                    G Codes:      [place],KATHrine E 06/11/2016, 1:58 PM Zenovia JarredKati Maudy Yonan, PT, DPT 06/11/2016 Pager: [phone]

## 2016-06-11 NOTE — Evaluation (Signed)
Occupational Therapy Evaluation Patient Details Name: [name] Jones MRN: [id number] DOB: 11-16-1923 Today's Date: 06/11/2016    History of Present Illness 82 yo female admitted after a fall and sustained  an Acute incomplete burst type fracture of the L1 vertebral body,involving the anterior and posterior walls and the superior endplate   Clinical Impression   Pt admitted with L1 burst fracture. Pt currently with functional limitations due to the deficits listed below (see OT Problem List).  Pt will benefit from skilled OT to increase their safety and independence with ADL and functional mobility for ADL to facilitate discharge to venue listed below.      Follow Up Recommendations  SNF    Equipment Recommendations  Hospital bed (IF HOME)       Precautions / Restrictions Precautions Precautions: Fall;Back Required Braces or Orthoses: Spinal Brace Spinal Brace: Thoracolumbosacral orthotic      Mobility Bed Mobility Overal bed mobility: Needs Assistance Bed Mobility: Rolling;Sidelying to Sit Rolling: Min guard Sidelying to sit: Min assist   Sit to supine: Min assist Sit to sidelying: Min assist General bed mobility comments: verbal cues for log roll technique, pt able to position to sidelying with cues, required assist for heels over EOB and slight assist for trunk upright  Transfers Overall transfer level: Needs assistance Equipment used: Rolling walker (2 wheeled) Transfers: Sit to/from [employer]Stand;Stand Pivot Transfers Sit to Stand: Min guard Stand pivot transfers: Min guard       General transfer comment: demonstrated safe technique for pt prior to performing sit to stand, brace was donned EOB, pt required increased time due to pain, cues for UEs through RW         ADL Overall ADL's : Needs assistance/impaired Eating/Feeding: Set up;Sitting   Grooming: Minimal assistance;Sitting   Upper Body Bathing: Minimal assitance;Sitting   Lower Body Bathing:  Maximal assistance;Sit to/from stand;Cueing for safety;Cueing for sequencing;Cueing for compensatory techniques   Upper Body Dressing : Minimal assistance;Sitting   Lower Body Dressing: Maximal assistance;Sit to/from stand;Cueing for back precautions;Cueing for safety;Cueing for sequencing;Cueing for compensatory techniques   Toilet Transfer: Moderate assistance;BSC;Cueing for safety;Cueing for sequencing;Stand-pivot   Toileting- Clothing Manipulation and Hygiene: Maximal assistance;Cueing for safety;Cueing for sequencing       Functional mobility during ADLs: Moderate assistance;Cueing for safety;Cueing for sequencing General ADL Comments: VC for back precautions               Pertinent Vitals/Pain Pain Assessment: 0-10 Pain Score: 9  Pain Location: back Pain Descriptors / Indicators: Aching;Discomfort;Sore Pain Intervention(s): Limited activity within patient's tolerance;Monitored during session;Patient requesting pain meds-RN notified     Hand Dominance     Extremity/Trunk Assessment         Cervical / Trunk Assessment Cervical / Trunk Assessment: Kyphotic   Communication Communication Communication: No difficulties   Cognition Arousal/Alertness: Awake/alert Behavior During Therapy: WFL for tasks assessed/performed Overall Cognitive Status: Within Functional Limits for tasks assessed                                Home Living Family/patient expects to be discharged to:: Private residence Living Arrangements: Spouse/significant other;Other (Comment) Available Help at Discharge: Personal care attendant Type of Home: House Home Access: Ramped entrance     Home Layout: One level               Home Equipment: Walker - 4 wheels;Walker - 2 wheels;Cane - single point  Additional Comments: 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 Now has hired  caregivers 24/7 to help husband. Drives, cooks. Hires cleaning lady. uses rollator as needed but not always        OT Problem List: Decreased strength;Decreased activity tolerance;Pain   OT Treatment/Interventions: Self-care/ADL training;DME and/or AE instruction;Patient/family education    OT Goals(Current goals can be found in the care plan section) Acute Rehab OT Goals Patient Stated Goal: to go home OT Goal Formulation: With patient Time For Goal Achievement: 06/18/16 Potential to Achieve Goals: Good  OT Frequency: Min 2X/week   Barriers to D/C:               End of Session Equipment Utilized During Treatment: Rolling walker;Back brace Nurse Communication: Mobility status  Activity Tolerance: Patient limited by pain Patient left: in bed;with call bell/phone within reach     Charges:  OT General Charges $OT Visit: 1 Procedure OT Evaluation $OT Eval Moderate Complexity: 1 Procedure OT Treatments $Self Care/Home Management : 8-22 mins G-Codes:    Einar CrowEDDING, Jovonni Borquez D 06/11/2016, 2:08 PM

## 2016-06-11 NOTE — Discharge Instructions (Signed)
Follow with Primary MD [name] Jones, Vernie AmmonsONALD WAYNE, MD in 7 days   Get CBC, CMP,checked  by Primary MD next visit.    Activity: As tolerated with Full fall precautions use walker/cane & assistance as needed   Disposition Home    Diet: Heart Healthy  , with feeding assistance and aspiration precautions.  For Heart failure patients - Check your Weight same time everyday, if you gain over 2 pounds, or you develop in leg swelling, experience more shortness of breath or chest pain, call your Primary MD immediately. Follow Cardiac Low Salt Diet and 1.5 lit/day fluid restriction.   On your next visit with your primary care physician please Get Medicines reviewed and adjusted.   Please request your Prim.MD to go over all Hospital Tests and Procedure/Radiological results at the follow up, please get all Hospital records sent to your Prim MD by signing hospital release before you go home.   If you experience worsening of your admission symptoms, develop shortness of breath, life threatening emergency, suicidal or homicidal thoughts you must seek medical attention immediately by calling 911 or calling your MD immediately  if symptoms less severe.  You Must read complete instructions/literature along with all the possible adverse reactions/side effects for all the Medicines you take and that have been prescribed to you. Take any new Medicines after you have completely understood and accpet all the possible adverse reactions/side effects.   Do not drive, operating heavy machinery, perform activities at heights, swimming or participation in water activities or provide baby sitting services if your were admitted for syncope or siezures until you have seen by Primary MD or a Neurologist and advised to do so again.  Do not drive when taking Pain medications.    Do not take more than prescribed Pain, Sleep and Anxiety Medications  Special Instructions: If you have smoked or chewed Tobacco  in the last 2 yrs  please stop smoking, stop any regular Alcohol  and or any Recreational drug use.  Wear Seat belts while driving.   Please note  You were cared for by a hospitalist during your hospital stay. If you have any questions about your discharge medications or the care you received while you were in the hospital after you are discharged, you can call the unit and asked to speak with the hospitalist on call if the hospitalist that took care of you is not available. Once you are discharged, your primary care physician will handle any further medical issues. Please note that NO REFILLS for any discharge medications will be authorized once you are discharged, as it is imperative that you return to your primary care physician (or establish a relationship with a primary care physician if you do not have one) for your aftercare needs so that they can reassess your need for medications and monitor your lab values.

## 2016-06-11 NOTE — Progress Notes (Signed)
   06/11/16 1500  PT Visit Information  Last PT Received On 06/11/16  Assistance Needed +1  History of Present Illness 82 yo female admitted after a fall and sustained  an Acute incomplete burst type fracture of the L1 vertebral body,involving the anterior and posterior walls and the superior endplate  Subjective Data  Subjective Pt had pain medication around lunchtime and agreeable to attempt mobility again.  Pt remains in increased pain and only tolerated transferring again.  Updated d/c recommendations to SNF.  Precautions  Precautions Fall;Back  Required Braces or Orthoses Spinal Brace  Spinal Brace TLSO  Pain Assessment  Pain Assessment 0-10  Pain Score 8  Pain Location back  Pain Descriptors / Indicators Aching;Radiating;Guarding;Grimacing;Sore;Spasm  Pain Intervention(s) Limited activity within patient's tolerance;Monitored during session;Premedicated before session;Repositioned  Cognition  Arousal/Alertness Awake/alert  Behavior During Therapy WFL for tasks assessed/performed  Overall Cognitive Status Within Functional Limits for tasks assessed  Bed Mobility  Overal bed mobility Needs Assistance  Bed Mobility Rolling;Sidelying to Sit  Rolling Min guard  Sidelying to sit Min assist  General bed mobility comments verbal cues for log roll technique, pt able to position to sidelying with cues, required assist for heels over EOB and slight assist for trunk upright  Transfers  Overall transfer level Needs assistance  Equipment used Rolling walker (2 wheeled)  Transfers Sit to/from Stand  Sit to Stand Min guard  Stand pivot transfers Min assist  General transfer comment assisted pt with donning brace EOB, verbal cues for safe technique, assist to steady during bouts of pain, [facility]  Ambulation/Gait  General Gait Details unable due to pain at this time  PT - End of Session  Equipment Utilized During Treatment Back brace  Activity Tolerance Patient limited by  pain  Patient left in bed;with call bell/phone within reach;with bed alarm set (sitting EOB, aware to use call for assist to supine)  Nurse Communication Mobility status  PT - Assessment/Plan  PT Plan Discharge plan needs to be updated  PT Frequency (ACUTE ONLY) Min 5X/week  Follow Up Recommendations SNF  PT equipment None recommended by PT  PT Goal Progression  Progress towards PT goals Progressing toward goals  PT Time Calculation  PT Start Time (ACUTE ONLY) 1419  PT Stop Time (ACUTE ONLY) 1440  PT Time Calculation (min) (ACUTE ONLY) 21 min  PT General Charges  $$ ACUTE PT VISIT 1 Procedure  PT Treatments  $Therapeutic Activity 8-22 mins   Zenovia JarredKati Lorrane Mccay, PT, DPT 06/11/2016 Pager: [phone]

## 2016-06-12 LAB — BASIC METABOLIC PANEL
Anion gap: 6 (ref 5–15)
BUN: 17 mg/dL (ref 6–20)
CO2: 28 mmol/L (ref 22–32)
Calcium: 8.6 mg/dL — ABNORMAL LOW (ref 8.9–10.3)
Chloride: 98 mmol/L — ABNORMAL LOW (ref 101–111)
Creatinine, Ser: 0.95 mg/dL (ref 0.44–1.00)
GFR calc Af Amer: 59 mL/min — ABNORMAL LOW (ref >60)
GFR calc non Af Amer: 51 mL/min — ABNORMAL LOW (ref >60)
Glucose, Bld: 97 mg/dL (ref 65–99)
Potassium: 4.1 mmol/L (ref 3.5–5.1)
Sodium: 132 mmol/L — ABNORMAL LOW (ref 135–145)

## 2016-06-12 LAB — CBC
HCT: 32.3 % — ABNORMAL LOW (ref 36.0–46.0)
Hemoglobin: 11 g/dL — ABNORMAL LOW (ref 12.0–15.0)
MCH: 31.8 pg (ref 26.0–34.0)
MCHC: 34.1 g/dL (ref 30.0–36.0)
MCV: 93.4 fL (ref 78.0–100.0)
Platelets: 180 10*3/uL (ref 150–400)
RBC: 3.46 MIL/uL — ABNORMAL LOW (ref 3.87–5.11)
RDW: 12.8 % (ref 11.5–15.5)
WBC: 7.7 10*3/uL (ref 4.0–10.5)

## 2016-06-12 MED ORDER — BISACODYL 5 MG PO TBEC
10.0000 mg | DELAYED_RELEASE_TABLET | Freq: Once | ORAL | Status: AC
  Administered 2016-06-12: 10 mg via ORAL
  Filled 2016-06-12: qty 2

## 2016-06-12 MED ORDER — VITAMINS A & D EX OINT
TOPICAL_OINTMENT | CUTANEOUS | Status: AC
  Administered 2016-06-12: 12:00:00
  Filled 2016-06-12: qty 5

## 2016-06-12 NOTE — Progress Notes (Signed)
PROGRESS NOTE                                                                                                                                                                                                             Patient Demographics:    [name] Jones, is a 82 y.o. female, DOB - 1924-02-11, ZOX:[id number]  Admit date - 06/09/2016   Admitting Physician Hillary BowJared M Gardner, DO  Outpatient Primary MD for the patient is ROBERTS, Vernie AmmonsONALD WAYNE, MD  LOS - 2    Chief Complaint  Patient presents with  . Fall       Brief Narrative   [name] Jones is a 82 y.o. female with medical history significant of A.Fib rhythm controlled with amiodarone but on Xarelto, HTN. She presents with fall, workup significant for L1 burst fracture with stenosis .   Subjective:    [name] RocksElizabeth Jones today has, No headache, No chest pain, No abdominal pain - No Nausea, Complaints of significant pain on ambulation.  Assessment  & Plan :    Principal Problem:   Burst fracture of lumbar vertebra, closed, initial encounter (HCC) Active Problems:   Atrial fibrillation (HCC)   HTN (hypertension)  Burst fx of L1, no canal stenosis  - ED discussed with neurosurgery Dr. Bevely Palmeritty, recommendation for TLSO, no surgery needed, follow-up with him in the office in one week for repeat imaging. - PT/OT following, recommended SNF placement - NU with when necessary pain medication  Atrial fibrillation - Normal sinus rhythm, continue with amiodarone,resumed on Xarelto   Hypertension - Cont  with home medication, and when necessary hydralazine    Code Status : Full  Family Communication  :  none at bedside   Disposition Plan  :SNF in a.m.   Consults  :  None   Procedures  : None  DVT Prophylaxis  :   SCDs , on Xarelto.  Lab Results  Component Value Date   PLT 180 06/12/2016    Antibiotics  :   Anti-infectives    None        Objective:   Vitals:   06/11/16 0416  06/11/16 1510 06/11/16 2118 06/12/16 0434  BP: (!) 123/56 (!) 118/55 (!) 137/46 (!) 142/48  Pulse: 60 67 60 62  Resp: 16 18 18 18   Temp: 98.4 F (36.9 C) 97.9 F (36.6  C) 98.4 F (36.9 C) 99.3 F (37.4 C)  TempSrc: Oral Oral Oral Oral  SpO2: 97% 100% 96% 96%  Weight:      Height:        Wt Readings from Last 3 Encounters:  06/10/16 57.6 kg (126 lb 15.8 oz)  04/20/16 53.1 kg (117 lb)  03/30/16 57.1 kg (125 lb 14.1 oz)     Intake/Output Summary (Last 24 hours) at 06/12/16 1248 Last data filed at 06/12/16 0803  Gross per 24 hour  Intake              480 ml  Output              325 ml  Net              155 ml     Physical Exam  Awake Alert, Oriented X 3,  [place].AT,PERRAL Supple Neck,No JVD, No cervical lymphadenopathy appriciated.  Symmetrical Chest wall movement, Good air movement bilaterally, CTAB RRR,No Gallops,Rubs or new Murmurs, No Parasternal Heave +ve B.Sounds, Abd Soft, Lower back tenderness,  No rebound - guarding or rigidity. No Cyanosis, Clubbing or edema, No new Rash or bruise     Data Review:    CBC  Recent Labs Lab 06/09/16 2031 06/12/16 0501  WBC  --  7.7  HGB 12.2 11.0*  HCT 36.0 32.3*  PLT  --  180  MCV  --  93.4  MCH  --  31.8  MCHC  --  34.1  RDW  --  12.8    Chemistries   Recent Labs Lab 06/09/16 2031 06/12/16 0501  NA 135 132*  K 3.8 4.1  CL 98* 98*  CO2  --  28  GLUCOSE 131* 97  BUN 23* 17  CREATININE 1.00 0.95  CALCIUM  --  8.6*   ------------------------------------------------------------------------------------------------------------------ No results for input(s): CHOL, HDL, LDLCALC, TRIG, CHOLHDL, LDLDIRECT in the last 72 hours.  No results found for: HGBA1C ------------------------------------------------------------------------------------------------------------------ No results for input(s): TSH, T4TOTAL, T3FREE, THYROIDAB in the last 72 hours.  Invalid input(s):  FREET3 ------------------------------------------------------------------------------------------------------------------ No results for input(s): VITAMINB12, FOLATE, FERRITIN, TIBC, IRON, RETICCTPCT in the last 72 hours.  Coagulation profile No results for input(s): INR, PROTIME in the last 168 hours.  No results for input(s): DDIMER in the last 72 hours.  Cardiac Enzymes No results for input(s): CKMB, TROPONINI, MYOGLOBIN in the last 168 hours.  Invalid input(s): CK ------------------------------------------------------------------------------------------------------------------    Component Value Date/Time   BNP 181.2 (H) 03/27/2016 0851    Inpatient Medications  Scheduled Meds: . amiodarone  200 mg Oral Q lunch  . calcium carbonate  1 tablet Oral QPM  . fluticasone  2 spray Each Nare Daily  . furosemide  40 mg Oral BID  . latanoprost  1 drop Both Eyes QHS  . metoprolol succinate  25 mg Oral Daily  . multivitamin with minerals  1 tablet Oral QPM  . potassium chloride  10 mEq Oral BID  . Rivaroxaban  15 mg Oral Q supper  . saccharomyces boulardii  250 mg Oral BID  . timolol  1 drop Both Eyes BID   Continuous Infusions: PRN Meds:.acetaminophen, dextromethorphan-guaiFENesin, hydrALAZINE, HYDROcodone-acetaminophen, methocarbamol, morphine injection, ondansetron (ZOFRAN) IV  Micro Results No results found for this or any previous visit (from the past 240 hour(s)).  Radiology Reports Ct Cervical Spine Wo Contrast  Result Date: 06/09/2016 CLINICAL DATA:  Neck pain after falling today.  Anticoagulated. EXAM: CT CERVICAL SPINE WITHOUT CONTRAST TECHNIQUE: Multidetector CT imaging of  the cervical spine was performed without intravenous contrast. Multiplanar CT image reconstructions were also generated. COMPARISON:  None. FINDINGS: Alignment: Normal. Skull base and vertebrae: No acute fracture. No primary bone lesion or focal pathologic process. Soft tissues and spinal canal: No  prevertebral fluid or swelling. No visible canal hematoma. Disc levels: Multilevel degenerative cervical disc disease, greatest at C5-6 and C6-7. Facet articulations are arthritic but otherwise intact. Mild degenerative anterolisthesis at C7-T1. Upper chest: No significant abnormality Other: None IMPRESSION: Cervical degenerative disc and facet changes. Negative for acute cervical spine fracture. No acute soft tissue abnormality. Electronically Signed   By: Ellery Plunk M.D.   On: 06/09/2016 22:34   Ct Abdomen Pelvis W Contrast  Result Date: 06/09/2016 CLINICAL DATA:  Left flank pain. Lumbar spine pain. Concern for retroperitoneal bleeding as the patient is anticoagulated for atrial fibrillation. EXAM: CT ABDOMEN AND PELVIS WITH CONTRAST TECHNIQUE: Multidetector CT imaging of the abdomen and pelvis was performed using the standard protocol following bolus administration of intravenous contrast. CONTRAST:  ISOVUE-300 IOPAMIDOL (ISOVUE-300) INJECTION 61% COMPARISON:  None. FINDINGS: Lower chest: No visible pleural or pericardial effusion. Small subpleural nodular opacity in the left lung base. Multifocal septal thickening. Hepatobiliary: Normal hepatic size and contours without focal liver lesion. No perihepatic ascites. No intra- or extrahepatic biliary dilatation. Normal gallbladder. Pancreas: Normal pancreatic contours and enhancement. No peripancreatic fluid collection or pancreatic ductal dilatation. Spleen: Normal. Adrenals/Urinary Tract: Normal adrenal glands. No hydronephrosis or solid renal mass. Stomach/Bowel: There is rectosigmoid diverticulosis without acute inflammation. No small bowel dilatation. No abdominal fluid collection. The appendix is not clearly visualized. There is no retroperitoneal hematoma. Vascular/Lymphatic: There is atherosclerotic calcification of the non aneurysmal abdominal aorta. No abdominal or pelvic adenopathy. Reproductive: Status post hysterectomy.  No adnexal  mass. Musculoskeletal: Multilevel lumbar facet arthrosis and osteophyte formation. There is a compression fracture of the L1 vertebral body with approximately 25% height loss anteriorly with no stenosis-causing retropulsion. The fracture involves the superior endplate and the anterior and posterior walls. There is grade 1 anterolisthesis at L4-L5. Other: No contributory non-categorized findings. IMPRESSION: 1. Acute incomplete burst type fracture of the L1 vertebral body, involving the anterior and posterior walls and the superior endplate. Approximately 25% height loss but only minimal retropulsion without resultant stenosis. 2. No retroperitoneal hematoma. 3. Rectosigmoid diverticulosis without acute inflammation. Electronically Signed   By: Deatra Robinson M.D.   On: 06/09/2016 22:45      Oasis Goehring M.D on 06/12/2016 at 12:48 PM  Between 7am to 7pm - Pager - [phone]  After 7pm go to [email or web address] - password [facility]  [facility] -  Office  [phone]

## 2016-06-12 NOTE — Progress Notes (Signed)
Occupational Therapy Treatment Patient Details Name: [name] Jones MRN: [id number] DOB: [birth year] Today's Date: 06/12/2016    History of present illness 82 yo female admitted after a fall and sustained  an Acute incomplete burst type fracture of the L1 vertebral body,involving the anterior and posterior walls and the superior endplate   OT comments  Pt motivated but limited by pain, especially when standing  Follow Up Recommendations  SNF    Equipment Recommendations       Recommendations for Other Services      Precautions / Restrictions Precautions Precautions: Fall;Back Required Braces or Orthoses: Spinal Brace Spinal Brace: Thoracolumbosacral orthotic Restrictions Weight Bearing Restrictions: No       Mobility Bed Mobility Overal bed mobility: Needs Assistance Bed Mobility: Rolling;Sidelying to Sit Rolling: Min guard Sidelying to sit: Min assist   Sit to supine: Min assist Sit to sidelying: Min assist General bed mobility comments: cues for log roll; back precautions  Transfers Overall transfer level: Needs assistance Equipment used: Rolling walker (2 wheeled) Transfers: Sit to/from Stand Sit to Stand: Min assist Stand pivot transfers: Min assist       General transfer comment: assist to rise, control descent; cues for back precautions    Balance                                   ADL       Grooming: Oral care;Wash/dry hands;Minimal assistance;Standing   Upper Body Bathing: Set up;Sitting   Lower Body Bathing: Maximal assistance;Sit to/from stand   Upper Body Dressing : Minimal assistance;Sitting       Toilet Transfer: Minimal assistance;Stand-pivot;BSC;RW   Toileting- Clothing Manipulation and Hygiene: Moderate assistance;Sit to/from stand         General ADL Comments: Pt needed to use bathroom:  max A for brace.  Stood to wash hands after using 3:1 commode then brushed teeth with min A to manage cup and emesis basin to  avoid bending to spit.  Student Rn brought washcloths and pt washed up.  Pt was premedicated but appeared to have spasms; notified RN      Vision                     Perception     Praxis      Cognition   Behavior During Therapy: [facility] for tasks assessed/performed Overall Cognitive Status: Within Functional Limits for tasks assessed                       Extremity/Trunk Assessment               Exercises     Shoulder Instructions       General Comments      Pertinent Vitals/ Pain       Pain Assessment: 0-10 Pain Score: 8  Pain Location: back Pain Descriptors / Indicators: Grimacing Pain Intervention(s): Limited activity within patient's tolerance;Monitored during session;Premedicated before session;Repositioned  Home Living                                          Prior Functioning/Environment              Frequency  Min 2X/week        Progress Toward Goals  OT Goals(current goals can now be  found in the care plan section)  Progress towards OT goals: Progressing toward goals     Plan      Co-evaluation                 End of Session     Activity Tolerance Patient limited by pain   Patient Left in bed;with call bell/phone within reach   Nurse Communication          Time: 1458-1533 OT Time Calculation (min): 35 min  Charges: OT General Charges $OT Visit: 1 Procedure OT Treatments $Self Care/Home Management : 23-37 mins  [name] Jones 06/12/2016, 3:49 PM  [name] Jones, OTR/L [phone] 06/12/2016

## 2016-06-12 NOTE — Progress Notes (Signed)
Physical Therapy Treatment Patient Details Name: [name] Jones MRN: [id number] DOB: 01-22-1924 Today's Date: 06/12/2016    History of Present Illness 82 yo female admitted after a fall and sustained  an Acute incomplete burst type fracture of the L1 vertebral body,involving the anterior and posterior walls and the superior endplate    PT Comments    Patient was seen in bed upon arrival. Rated pain as a 8/10 in low back. Patient had been pre medicated before treatment. Educated patient on performing log roll to get out of bed in order to follow back precautions. VC's required to push up with elbow and use rail to assist with raising trunk. Assisted patient with donning brace on EOB. Sit to stand x min guard with VC's for safe UE placement and maneuvering turns during standing pivot transfer. Increased time required secondary to pain.  Gait x 12 ft x min guard with VC's for LE placement within the Rw. VC's given to maintain upright trunk to take pressure off of back. Patient c/o sharp centralized pain in low back every 2-3 steps of gait, then would alleviate after a few seconds. Repositioned in recliner following ambulation.   Follow Up Recommendations  SNF     Equipment Recommendations  None recommended by PT    Recommendations for Other Services       Precautions / Restrictions Precautions Precautions: Fall;Back Required Braces or Orthoses: Spinal Brace Spinal Brace: Thoracolumbosacral orthotic Restrictions Weight Bearing Restrictions: No    Mobility  Bed Mobility Overal bed mobility: Needs Assistance Bed Mobility: Rolling;Sidelying to Sit Rolling: Min guard Sidelying to sit: Min assist       General bed mobility comments: VC's to utilize log roll technique and to use rail and elbow to raise trunk in bed. VC's required to scoot to EOB so that BLE's touched floor.   Transfers Overall transfer level: Needs assistance Equipment used: Rolling walker (2 wheeled) Transfers:  Sit to/from Stand Sit to Stand: Min guard Stand pivot transfers: Min assist       General transfer comment: assisted pt with donning brace EOB, VC's for safe UE placement and maneuvering turn during standing pivot transfer.  Increased time required secondary to pain.   Ambulation/Gait Ambulation/Gait assistance: Min guard Ambulation Distance (Feet): 12 Feet Assistive device: Rolling walker (2 wheeled) Gait Pattern/deviations: Step-to pattern;Decreased step length - right;Decreased step length - left;Decreased stride length;Antalgic;Trunk flexed Gait velocity: decreased Gait velocity interpretation: Below normal speed for age/gender General Gait Details: VC's for LE placement within the RW. VC's to maintain upright posture to take pressure off of back. Patient c/o sharp centralized pain in low back every few steps of gait.    Stairs            Wheelchair Mobility    Modified Rankin (Stroke Patients Only)       Balance                                    Cognition Arousal/Alertness: Awake/alert Behavior During Therapy: WFL for tasks assessed/performed Overall Cognitive Status: Within Functional Limits for tasks assessed                      Exercises      General Comments        Pertinent Vitals/Pain Pain Assessment: 0-10 Pain Score: 8  Pain Location: back Pain Descriptors / Indicators: Grimacing;Discomfort;Guarding;Moaning;Sharp Pain Intervention(s): Limited activity within  patient's tolerance;Monitored during session;Premedicated before session;Repositioned    Home Living                      Prior Function            PT Goals (current goals can now be found in the care plan section) Progress towards PT goals: Progressing toward goals    Frequency    Min 5X/week      PT Plan Discharge plan needs to be updated    Co-evaluation             End of Session Equipment Utilized During Treatment: Back brace;Gait  belt Activity Tolerance: Patient limited by pain Patient left: with call bell/phone within reach;with bed alarm set;with chair alarm set;in chair     Time: 1155-1220 PT Time Calculation (min) (ACUTE ONLY): 25 min  Charges:  $Gait Training: 8-22 mins $Therapeutic Activity: 8-22 mins                    G CodesMarcelino Jones:      [name] Jones, SPTA [facility] Acute Rehab [phone]  Present and agree with above  Felecia ShellingLori Verle Wheeling  PTA [facility]  Acute  Rehab Pager      [phone]

## 2016-06-12 NOTE — Progress Notes (Signed)
LCSWA met with patient at bedside and discussed disposition to [facility]. [place], she plans fill out paper tomorrow.  Will follow up in the a.m

## 2016-06-13 DIAGNOSIS — S32001A Stable burst fracture of unspecified lumbar vertebra, initial encounter for closed fracture: Secondary | ICD-10-CM

## 2016-06-13 MED ORDER — FLEET ENEMA 7-19 GM/118ML RE ENEM
1.0000 | ENEMA | Freq: Once | RECTAL | Status: AC
  Administered 2016-06-13: 1 via RECTAL
  Filled 2016-06-13: qty 1

## 2016-06-13 MED ORDER — ONDANSETRON 4 MG PO TBDP: 4.0000 mg | ORAL_TABLET | Freq: Four times a day (QID) | ORAL | Status: DC | PRN

## 2016-06-13 MED ORDER — ONDANSETRON 4 MG PO TBDP
8.0000 mg | ORAL_TABLET | Freq: Once | ORAL | Status: AC
  Administered 2016-06-13: 8 mg via ORAL
  Filled 2016-06-13: qty 2

## 2016-06-13 MED ORDER — METHOCARBAMOL 500 MG PO TABS: 500.0000 mg | ORAL_TABLET | Freq: Four times a day (QID) | ORAL | 0 refills | Status: DC | PRN

## 2016-06-13 MED ORDER — MAGNESIUM CITRATE PO SOLN
1.0000 | Freq: Once | ORAL | Status: AC
  Administered 2016-06-13: 1 via ORAL
  Filled 2016-06-13: qty 296

## 2016-06-13 MED ORDER — LACTULOSE 10 GM/15ML PO SOLN
30.0000 g | Freq: Every day | ORAL | Status: DC | PRN
  Administered 2016-06-13: 30 g via ORAL
  Filled 2016-06-13: qty 45

## 2016-06-13 NOTE — Progress Notes (Signed)
Report called to Kim, RN at [facility].   

## 2016-06-13 NOTE — Discharge Summary (Addendum)
Physician Discharge Summary  [name] Jones ZOX:[id number] DOB: 1924-01-25 DOA: 06/09/2016  PCP: Lorenda PeckOBERTS, RONALD WAYNE, MD  Admit date: 06/09/2016 Discharge date: 06/14/2016  Disposition:  SNF  Recommendations for Outpatient Follow-up:  1. Follow up with PCP in 1-2 weeks 2. Follow with neurosurgery in 1-2 weeks as an outpatient 3. Please ensure regular bowel movements as patient will be prone to constipation  Discharge Condition:Stable CODE STATUS:Full Diet recommendation: Heart healthy   Brief/Interim Summary: 82 y.o.femalewith medical history significant of A.Fib rhythm controlled with amiodarone but on Xarelto, HTN. She presents with fall, workup significant for L1 burst fracture with stenosis .   Burst fx of L1, no canal stenosis  - ED discussed with neurosurgery Dr. Bevely Palmeritty, recommendation for TLSO, no surgery needed, follow-up with him in the office in one week for repeat imaging. - PT/OT following, recommended SNF placement - NU with when necessary pain medication  Atrial fibrillation - Normal sinus rhythm, continue with amiodarone,resumed on Xarelto   Hypertension - Cont  with home medication, and when necessary hydralazine  Discharge Diagnoses:  Principal Problem:   Burst fracture of lumbar vertebra, closed, initial encounter (HCC) Active Problems:   Atrial fibrillation (HCC)   HTN (hypertension)    Discharge Instructions  Discharge Instructions    Diet - low sodium heart healthy    Complete by:  As directed        Medication List    TAKE these medications   acetaminophen 325 MG tablet Commonly known as:  TYLENOL Take 650 mg by mouth at bedtime as needed for moderate pain.   amiodarone 200 MG tablet Commonly known as:  PACERONE Take 1 tablet (200 mg total) by mouth daily. What changed:  when to take this   calcium carbonate 1250 (500 Ca) MG tablet Commonly known as:  OS-CAL - dosed in mg of elemental calcium Take 1 tablet by mouth every  evening.   carteolol 1 % ophthalmic solution Commonly known as:  OCUPRESS Place 1 drop into both eyes 2 (two) times daily.   furosemide 40 MG tablet Commonly known as:  LASIX Take 1 tablet (40 mg total) by mouth 2 (two) times daily.   HYDROcodone-acetaminophen 10-325 MG tablet Commonly known as:  NORCO Take 2 tablets by mouth every 6 (six) hours as needed for severe pain.   KLOR-CON M10 10 MEQ tablet Generic drug:  potassium chloride TAKE 1 TABLET (10 MEQ TOTAL) BY MOUTH 2 (TWO) TIMES DAILY.   latanoprost 0.005 % ophthalmic solution Commonly known as:  XALATAN Place 1 drop into both eyes at bedtime.   methocarbamol 500 MG tablet Commonly known as:  ROBAXIN Take 1 tablet (500 mg total) by mouth every 6 (six) hours as needed for muscle spasms.   metoprolol succinate 25 MG 24 hr tablet Commonly known as:  TOPROL-XL Take 1 tablet (25 mg total) by mouth daily.   multivitamin with minerals Tabs tablet Take 1 tablet by mouth every evening.   mupirocin ointment 2 % Commonly known as:  BACTROBAN Place 1 application into the nose daily.   Rivaroxaban 15 MG Tabs tablet Commonly known as:  XARELTO Take 1 tablet (15 mg total) by mouth daily with supper.   saccharomyces boulardii 250 MG capsule Commonly known as:  FLORASTOR Take 1 capsule (250 mg total) by mouth 2 (two) times daily.   vitamin C 500 MG tablet Commonly known as:  ASCORBIC ACID Take 500 mg by mouth daily with lunch.      Follow-up Information  ROBERTS, [name], MD Follow up in 1 week(s).   Specialty:  Internal Medicine Contact information: [address] [place] [address] [phone]        Loura Halt Ditty, MD Follow up in 2 week(s).   Specialty:  Neurosurgery Why:  L1 FRACTURE Contact information: [address] [place] [address] [phone]          Allergies  Allergen Reactions  . Lisinopril Cough     Procedures/Studies: Ct Cervical Spine Wo  Contrast  Result Date: 06/09/2016 CLINICAL DATA:  Neck pain after falling today.  Anticoagulated. EXAM: CT CERVICAL SPINE WITHOUT CONTRAST TECHNIQUE: Multidetector CT imaging of the cervical spine was performed without intravenous contrast. Multiplanar CT image reconstructions were also generated. COMPARISON:  None. FINDINGS: Alignment: Normal. Skull base and vertebrae: No acute fracture. No primary bone lesion or focal pathologic process. Soft tissues and spinal canal: No prevertebral fluid or swelling. No visible canal hematoma. Disc levels: Multilevel degenerative cervical disc disease, greatest at C5-6 and C6-7. Facet articulations are arthritic but otherwise intact. Mild degenerative anterolisthesis at C7-T1. Upper chest: No significant abnormality Other: None IMPRESSION: Cervical degenerative disc and facet changes. Negative for acute cervical spine fracture. No acute soft tissue abnormality. Electronically Signed   By: Ellery Plunk M.D.   On: 06/09/2016 22:34   Ct Abdomen Pelvis W Contrast  Result Date: 06/09/2016 CLINICAL DATA:  Left flank pain. Lumbar spine pain. Concern for retroperitoneal bleeding as the patient is anticoagulated for atrial fibrillation. EXAM: CT ABDOMEN AND PELVIS WITH CONTRAST TECHNIQUE: Multidetector CT imaging of the abdomen and pelvis was performed using the standard protocol following bolus administration of intravenous contrast. CONTRAST:  ISOVUE-300 IOPAMIDOL (ISOVUE-300) INJECTION 61% COMPARISON:  None. FINDINGS: Lower chest: No visible pleural or pericardial effusion. Small subpleural nodular opacity in the left lung base. Multifocal septal thickening. Hepatobiliary: Normal hepatic size and contours without focal liver lesion. No perihepatic ascites. No intra- or extrahepatic biliary dilatation. Normal gallbladder. Pancreas: Normal pancreatic contours and enhancement. No peripancreatic fluid collection or pancreatic ductal dilatation. Spleen: Normal.  Adrenals/Urinary Tract: Normal adrenal glands. No hydronephrosis or solid renal mass. Stomach/Bowel: There is rectosigmoid diverticulosis without acute inflammation. No small bowel dilatation. No abdominal fluid collection. The appendix is not clearly visualized. There is no retroperitoneal hematoma. Vascular/Lymphatic: There is atherosclerotic calcification of the non aneurysmal abdominal aorta. No abdominal or pelvic adenopathy. Reproductive: Status post hysterectomy.  No adnexal mass. Musculoskeletal: Multilevel lumbar facet arthrosis and osteophyte formation. There is a compression fracture of the L1 vertebral body with approximately 25% height loss anteriorly with no stenosis-causing retropulsion. The fracture involves the superior endplate and the anterior and posterior walls. There is grade 1 anterolisthesis at L4-L5. Other: No contributory non-categorized findings. IMPRESSION: 1. Acute incomplete burst type fracture of the L1 vertebral body, involving the anterior and posterior walls and the superior endplate. Approximately 25% height loss but only minimal retropulsion without resultant stenosis. 2. No retroperitoneal hematoma. 3. Rectosigmoid diverticulosis without acute inflammation. Electronically Signed   By: Deatra Robinson M.D.   On: 06/09/2016 22:45    Subjective: No complaints  Discharge Exam: Vitals:   06/13/16 2222 06/14/16 0530  BP: (!) 170/71 139/63  Pulse: 68 73  Resp: 18 18  Temp: 97.4 F (36.3 C) 98.3 F (36.8 C)   Vitals:   06/13/16 0500 06/13/16 1515 06/13/16 2222 06/14/16 0530  BP: (!) 181/75 (!) 157/72 (!) 170/71 139/63  Pulse: 69 64 68 73  Resp: 18 18  18 18  Temp: 99.1 F (37.3 C) 98.2 F (36.8 C) 97.4 F (36.3 C) 98.3 F (36.8 C)  TempSrc: Oral Oral Oral Oral  SpO2: 97% 100% 98% 99%  Weight:      Height:        General: Pt is alert, awake, not in acute distress Cardiovascular: RRR, S1/S2 +, no rubs, no gallops Respiratory: CTA bilaterally, no wheezing, no  rhonchi Abdominal: Soft, NT, ND, bowel sounds + Extremities: no edema, no cyanosis   The results of significant diagnostics from this hospitalization (including imaging, microbiology, ancillary and laboratory) are listed below for reference.     Microbiology: No results found for this or any previous visit (from the past 240 hour(s)).   Labs: BNP (last 3 results)  Recent Labs  12/13/15 2346 03/27/16 0851  BNP 196.7* 181.2*   Basic Metabolic Panel:  Recent Labs Lab 06/09/16 2031 06/12/16 0501  NA 135 132*  K 3.8 4.1  CL 98* 98*  CO2  --  28  GLUCOSE 131* 97  BUN 23* 17  CREATININE 1.00 0.95  CALCIUM  --  8.6*   Liver Function Tests: No results for input(s): AST, ALT, ALKPHOS, BILITOT, PROT, ALBUMIN in the last 168 hours. No results for input(s): LIPASE, AMYLASE in the last 168 hours. No results for input(s): AMMONIA in the last 168 hours. CBC:  Recent Labs Lab 06/09/16 2031 06/12/16 0501  WBC  --  7.7  HGB 12.2 11.0*  HCT 36.0 32.3*  MCV  --  93.4  PLT  --  180   Cardiac Enzymes: No results for input(s): CKTOTAL, CKMB, CKMBINDEX, TROPONINI in the last 168 hours. BNP: Invalid input(s): POCBNP CBG: No results for input(s): GLUCAP in the last 168 hours. D-Dimer No results for input(s): DDIMER in the last 72 hours. Hgb A1c No results for input(s): HGBA1C in the last 72 hours. Lipid Profile No results for input(s): CHOL, HDL, LDLCALC, TRIG, CHOLHDL, LDLDIRECT in the last 72 hours. Thyroid function studies No results for input(s): TSH, T4TOTAL, T3FREE, THYROIDAB in the last 72 hours.  Invalid input(s): FREET3 Anemia work up No results for input(s): VITAMINB12, FOLATE, FERRITIN, TIBC, IRON, RETICCTPCT in the last 72 hours. Urinalysis    Component Value Date/Time   COLORURINE YELLOW 03/27/2016 0835   APPEARANCEUR CLEAR 03/27/2016 0835   LABSPEC 1.010 03/27/2016 0835   PHURINE 7.5 03/27/2016 0835   GLUCOSEU NEGATIVE 03/27/2016 0835   HGBUR SMALL (A)  03/27/2016 0835   BILIRUBINUR NEGATIVE 03/27/2016 0835   KETONESUR NEGATIVE 03/27/2016 0835   PROTEINUR 30 (A) 03/27/2016 0835   NITRITE NEGATIVE 03/27/2016 0835   LEUKOCYTESUR NEGATIVE 03/27/2016 0835   Sepsis Labs Invalid input(s): PROCALCITONIN,  WBC,  LACTICIDVEN Microbiology No results found for this or any previous visit (from the past 240 hour(s)).   SIGNED:   Jerald KiefHIU, Lachele Lievanos K, MD  [facility] 06/14/2016, 11:42 AM  If 7PM-7AM, please contact night-coverage [email or web address] Password [email or web address]

## 2016-06-13 NOTE — Clinical Social Work Placement (Signed)
   CLINICAL SOCIAL WORK PLACEMENT  NOTE  Date:  06/13/2016  Patient Details  Name: [name] Jones MRN: [id number] Date of Birth: [birth year]  Clinical Social Work is seeking post-discharge placement for this patient at the Skilled  Nursing Facility level of care (*CSW will initial, date and re-position this form in  chart as items are completed):  Yes   Patient/family provided with [place] Clinical Social Work Department's list of facilities offering this level of care within the geographic area requested by the patient (or if unable, by the patient's family).  Yes   Patient/family informed of their freedom to choose among providers that offer the needed level of care, that participate in Medicare, Medicaid or managed care program needed by the patient, have an available bed and are willing to accept the patient.  Yes   Patient/family informed of [facility]'s ownership interest in [facility] and [facility], as well as of the fact that they are under no obligation to receive care at these facilities.  PASRR submitted to EDS on       PASRR number received on       Existing PASRR number confirmed on 06/13/16     FL2 transmitted to all facilities in geographic area requested by pt/family on       FL2 transmitted to all facilities within larger geographic area on       Patient informed that his/her managed care company has contracts with or will negotiate with certain facilities, including the following:  [facility]     Yes   Patient/family informed of bed offers received.  [facility]     Physician recommends and [facility]    [facility].  Patient to be transferred to facility by [facility]     Patient family notified on 06/13/16 of transfer.  Name of family member notified:  Carolyn-POA     PHYSICIAN Please prepare priority discharge summary, including medications      Additional Comment:    _______________________________________________ Clearance CootsNicole A Dustine Stickler, LCSW 06/13/2016, 12:57 PM

## 2016-06-13 NOTE — Progress Notes (Signed)
[facility] called for [facility]

## 2016-06-13 NOTE — Progress Notes (Signed)
Spoke with Tama GanderKatherine Schorr, NP about patient's nausea.  Patent has vomited twice and remains nauseated.  IV removed due to discharge.  NP stated OK for patient to have ODT Zofran PRN and postpone discharge until tomorrow due to acute nausea.  Patient would prefer to stay overnight.  Notified patient who notified family.  Notified [facility] and [facility].  Gave patient Zofran, will continue to monitor.

## 2016-06-13 NOTE — Progress Notes (Signed)
[facility]. Waiting on patient to have BM. Nurse Given Report. Instructed to call RN to call [facility] when patient is ready.  Refer to CSW on call if patient is ready for d/c tomorrow.   Vivi BarrackNicole Quanna Wittke, Theresia MajorsLCSWA, MSW Clinical Social Worker 5E and Psychiatric Service Line [phone] 06/13/2016  4:55 PM

## 2016-06-14 DIAGNOSIS — I10 Essential (primary) hypertension: Secondary | ICD-10-CM

## 2016-06-14 NOTE — Progress Notes (Signed)
Verbal report to Nelta NumbersLiz, DON, [facility], patient to be transferred via [facility] at this time with all belongings, including back brace, in stable condition, alert, oriented, VSS, discharge packet sent with patient that includes one prescription for Norco that is signed by Dr Rhona Leavenshiu.

## 2016-06-14 NOTE — Progress Notes (Signed)
Erin, Child psychotherapistocial Worker, is in process of speaking to [facility] regarding transport/official acceptance of patient.

## 2016-06-14 NOTE — Progress Notes (Signed)
PROGRESS NOTE    [name] Jones  [id number] DOB: 09/04/1923 DOA: 06/09/2016 PCP: Lorenda PeckOBERTS, RONALD WAYNE, MD    Brief Narrative:  82 y.o.femalewith medical history significant of A.Fib rhythm controlled with amiodarone but on Xarelto, HTN. She presents with fall, workup significant for L1 burst fracture with stenosis   Assessment & Plan:   Principal Problem:   Burst fracture of lumbar vertebra, closed, initial encounter (HCC) Active Problems:   Atrial fibrillation (HCC)   HTN (hypertension)   Burst fx of L1, no canal stenosis  - ED discussed with neurosurgery Dr. Bevely Palmeritty, recommendation for TLSO, no surgery needed, follow-up with him in the office in one week for repeat imaging. - PT/OT following, recommended SNF placement - Patient to continue with when necessary pain medication  Atrial fibrillation - Normal sinus rhythm, continue with amiodarone,resumed on Xarelto   Hypertension - Cont with home medication, and when necessary hydralazine  Constipation -Patient's discharge was held on 06/13/2016 secondary to significant constipation. -Patient's bowel did move following multiple cathartics and enemas.    Antimicrobials: Anti-infectives    None       Subjective: No complaints today  Objective: Vitals:   06/13/16 0500 06/13/16 1515 06/13/16 2222 06/14/16 0530  BP: (!) 181/75 (!) 157/72 (!) 170/71 139/63  Pulse: 69 64 68 73  Resp: 18 18 18 18   Temp: 99.1 F (37.3 C) 98.2 F (36.8 C) 97.4 F (36.3 C) 98.3 F (36.8 C)  TempSrc: Oral Oral Oral Oral  SpO2: 97% 100% 98% 99%  Weight:      Height:        Intake/Output Summary (Last 24 hours) at 06/14/16 1143 Last data filed at 06/14/16 1000  Gross per 24 hour  Intake              480 ml  Output                0 ml  Net              480 ml   Filed Weights   06/10/16 0106  Weight: 57.6 kg (126 lb 15.8 oz)    Examination:  General exam: Appears calm and comfortable  Respiratory system: Clear  to auscultation. Respiratory effort normal. Cardiovascular system: S1 & S2 heard, RRR. No JVD, murmurs, rubs, gallops or clicks. No pedal edema. Gastrointestinal system: Abdomen is nondistended, soft and nontender. No organomegaly or masses felt. Normal bowel sounds heard. Central nervous system: Alert and oriented. No focal neurological deficits. Extremities: Symmetric 5 x 5 power. Skin: No rashes, lesions Psychiatry: Judgement and insight appear normal. Mood & affect appropriate.   Data Reviewed: I have personally reviewed following labs and imaging studies  CBC:  Recent Labs Lab 06/09/16 2031 06/12/16 0501  WBC  --  7.7  HGB 12.2 11.0*  HCT 36.0 32.3*  MCV  --  93.4  PLT  --  180   Basic Metabolic Panel:  Recent Labs Lab 06/09/16 2031 06/12/16 0501  NA 135 132*  K 3.8 4.1  CL 98* 98*  CO2  --  28  GLUCOSE 131* 97  BUN 23* 17  CREATININE 1.00 0.95  CALCIUM  --  8.6*   GFR: Estimated Creatinine Clearance: 31.3 mL/min (by C-G formula based on SCr of 0.95 mg/dL). Liver Function Tests: No results for input(s): AST, ALT, ALKPHOS, BILITOT, PROT, ALBUMIN in the last 168 hours. No results for input(s): LIPASE, AMYLASE in the last 168 hours. No results for input(s): AMMONIA in the  last 168 hours. Coagulation Profile: No results for input(s): INR, PROTIME in the last 168 hours. Cardiac Enzymes: No results for input(s): CKTOTAL, CKMB, CKMBINDEX, TROPONINI in the last 168 hours. BNP (last 3 results) No results for input(s): PROBNP in the last 8760 hours. HbA1C: No results for input(s): HGBA1C in the last 72 hours. CBG: No results for input(s): GLUCAP in the last 168 hours. Lipid Profile: No results for input(s): CHOL, HDL, LDLCALC, TRIG, CHOLHDL, LDLDIRECT in the last 72 hours. Thyroid Function Tests: No results for input(s): TSH, T4TOTAL, FREET4, T3FREE, THYROIDAB in the last 72 hours. Anemia Panel: No results for input(s): VITAMINB12, FOLATE, FERRITIN, TIBC, IRON,  RETICCTPCT in the last 72 hours. Sepsis Labs: No results for input(s): PROCALCITON, LATICACIDVEN in the last 168 hours.  No results found for this or any previous visit (from the past 240 hour(s)).   Radiology Studies: No results found.  Scheduled Meds: . amiodarone  200 mg Oral Q lunch  . calcium carbonate  1 tablet Oral QPM  . fluticasone  2 spray Each Nare Daily  . furosemide  40 mg Oral BID  . latanoprost  1 drop Both Eyes QHS  . metoprolol succinate  25 mg Oral Daily  . multivitamin with minerals  1 tablet Oral QPM  . potassium chloride  10 mEq Oral BID  . Rivaroxaban  15 mg Oral Q supper  . saccharomyces boulardii  250 mg Oral BID  . timolol  1 drop Both Eyes BID   Continuous Infusions:   LOS: 4 days   Lejon Afzal, Scheryl MartenSTEPHEN K, MD [facility] Pager [phone]  If 7PM-7AM, please contact night-coverage [email or web address] Password [email or web address] 06/14/2016, 11:43 AM

## 2016-06-14 NOTE — Progress Notes (Signed)
Physical Therapy Treatment Patient Details Name: [name] Jones MRN: [id number] DOB: 10-Aug-1923 Today's Date: 06/14/2016    History of Present Illness 82 yo female admitted after a fall and sustained  an Acute incomplete burst type fracture of the L1 vertebral body,involving the anterior and posterior walls and the superior endplate    PT Comments    Assisted to EOB via "Log Roll".  Applied back brace.  Pt remembered straps go "under the arms".  [facility]  Assisted with hygiene then amb a greater distance in hallway.  Progressing slowly.  Will need ST Rehab at SNF prior to retuning home.   Follow Up Recommendations  SNF     Equipment Recommendations  None recommended by PT    Recommendations for Other Services       Precautions / Restrictions Precautions Precautions: Fall;Back Precaution Comments: pt aware she needs to wear her back barce any time she is OOB Required Braces or Orthoses: Spinal Brace Spinal Brace: Thoracolumbosacral orthotic Restrictions Weight Bearing Restrictions: No    Mobility  Bed Mobility Overal bed mobility: Needs Assistance Bed Mobility: Rolling;Sidelying to Sit Rolling: Min guard Sidelying to sit: Min assist       General bed mobility comments: cues for log roll; back precautions  Transfers Overall transfer level: Needs assistance Equipment used: Rolling walker (2 wheeled) Transfers: Sit to/from [employer]Stand;Stand Pivot Transfers Sit to Stand: Min assist Stand pivot transfers: Min assist       General transfer comment: assist to rise, control descent; cues for back precautions    [facility] to [facility]  Ambulation/Gait Ambulation/Gait assistance: Min assist;Min guard Ambulation Distance (Feet): 22 Feet Assistive device: Rolling walker (2 wheeled) Gait Pattern/deviations: Step-to pattern;Step-through pattern Gait velocity: decreased   General Gait Details: VC's for LE placement within the RW. VC's to maintain upright posture to take  pressure off of back. Patient c/o sharp centralized pain in low back every few steps of gait.    Stairs            Wheelchair Mobility    Modified Rankin (Stroke Patients Only)       Balance                                    Cognition Arousal/Alertness: Awake/alert Behavior During Therapy: WFL for tasks assessed/performed Overall Cognitive Status: Within Functional Limits for tasks assessed                      Exercises      General Comments        Pertinent Vitals/Pain Pain Assessment: 0-10 Pain Score: 5  Pain Location: Back at end od session, requested Tylenol Pain Descriptors / Indicators: Discomfort;Grimacing Pain Intervention(s): Monitored during session;Repositioned    Home Living                      Prior Function            PT Goals (current goals can now be found in the care plan section) Progress towards PT goals: Progressing toward goals    Frequency    Min 5X/week      PT Plan Discharge plan needs to be updated    Co-evaluation             End of Session Equipment Utilized During Treatment: Gait belt Activity Tolerance: Patient tolerated treatment well Patient left: in chair;with call  bell/phone within reach     Time: 1000-1026 PT Time Calculation (min) (ACUTE ONLY): 26 min  Charges:  $Gait Training: 8-22 mins $Therapeutic Activity: 8-22 mins                    G Codes:      Felecia ShellingLori Lalania Haseman  PTA [facility]  Acute  Rehab Pager      [phone]

## 2016-06-14 NOTE — Progress Notes (Signed)
Patient is set to [facility] today. Patient & family,Carolyn, aware.PTAR called for transport at 12:45pm.   Number for report is [phone]  Stacy GardnerErin Jeison Delpilar, [facility] SugarlandCSWA Clinical Social Worker [phone]

## 2016-06-14 NOTE — Progress Notes (Signed)
Spoke to Marisue IvanLiz, Interior and spatial designerDirector of Nursing @ [facility] @ [phone], she states patient may be transferred into facility today. She asked that Dr. Rhona Leavenshiu update discharge summary with today's date, Social Work arrange transport to facility, and that we call facility with an ETA of patient.

## 2016-06-14 NOTE — Care Management Note (Signed)
Case Management Note  Patient Details  Name: [name] Jones MRN: [id number] Date of Birth: 19-Jul-1924  Subjective/Objective:   Burst fx of L1, no canal stenosis                  Action/Plan: Discharge Planning: AVS reviewed:  Chart reviewed. CSW arranged SNF placement.    Expected Discharge Date:  06/14/2016              Expected Discharge Plan:  Skilled Nursing Facility  In-House Referral:  Clinical Social Work  Discharge planning Services  CM Consult  Post Acute Care Choice:  NA Choice offered to:  NA  DME Arranged:  N/A DME Agency:  NA  HH Arranged:  NA HH Agency:  NA  Status of Service:  Completed, signed off  If discussed at [employer] Length of Stay Meetings, dates discussed:    Additional Comments:  Elliot CousinShavis, Tierre Gerard Ellen, RN 06/14/2016, 1:46 PM

## 2016-06-18 ENCOUNTER — Encounter: Payer: Self-pay | Admitting: Adult Health

## 2016-06-18 ENCOUNTER — Non-Acute Institutional Stay (SKILLED_NURSING_FACILITY): Payer: Medicare Other | Admitting: Adult Health

## 2016-06-18 DIAGNOSIS — I5032 Chronic diastolic (congestive) heart failure: Secondary | ICD-10-CM

## 2016-06-18 DIAGNOSIS — I480 Paroxysmal atrial fibrillation: Secondary | ICD-10-CM

## 2016-06-18 DIAGNOSIS — S32001S Stable burst fracture of unspecified lumbar vertebra, sequela: Secondary | ICD-10-CM | POA: Diagnosis not present

## 2016-06-18 DIAGNOSIS — R2681 Unsteadiness on feet: Secondary | ICD-10-CM

## 2016-06-18 DIAGNOSIS — E871 Hypo-osmolality and hyponatremia: Secondary | ICD-10-CM

## 2016-06-18 DIAGNOSIS — D649 Anemia, unspecified: Secondary | ICD-10-CM | POA: Diagnosis not present

## 2016-06-18 DIAGNOSIS — H409 Unspecified glaucoma: Secondary | ICD-10-CM

## 2016-06-18 DIAGNOSIS — E876 Hypokalemia: Secondary | ICD-10-CM

## 2016-06-18 DIAGNOSIS — K5901 Slow transit constipation: Secondary | ICD-10-CM

## 2016-06-18 NOTE — Progress Notes (Signed)
DATE:  06/18/2016   MRN:  [id number]  BIRTHDAY: 10-16-1923  Facility:  Nursing Home Location:  [facility]  Nursing Home Room Number: [id number]  LEVEL OF CARE:  SNF (31)  Contact Information    Name Relation Home Work Mobile   Belmontes,Carolyn Relative [phone]     Khader,Billy Spouse [phone]     Kerr,Richard Relative [phone]     Watt ClimesSmith,Bryan Son [phone]         Code Status History    Date Active Date Inactive Code Status Order ID Comments User Context   06/09/2016 11:27 PM 06/14/2016  5:35 PM Full Code [id number]  Hillary BowJared M Gardner, DO ED   03/27/2016 11:47 AM 03/30/2016  5:31 PM Full Code [id number]  Coralie KeensMauricio Daniel Arrien, MD Inpatient   06/01/2015  2:34 PM 06/10/2015  9:36 PM Full Code [id number]  Orpah CobbAjay Kadakia, MD ED       Chief Complaint  Patient presents with  . Hospitalization Follow-up    HISTORY OF PRESENT ILLNESS:  This is a 89-yea-old female who has [facility]/09/16 to 06/13/16. She has L1 burst fracture with stenosis. Neurosurgery was consulted and recommended TLSO. No surgery needed and will follow-up as an outpatient.   She has been admitted for a short-term rehabilitation.  She was seen in her room today and complained of constipation.   PAST MEDICAL HISTORY:  Past Medical History:  Diagnosis Date  . Atrial fibrillation (HCC)   . Chronic diastolic CHF (congestive heart failure) (HCC)   . Glaucoma   . Hypertension   . Hyponatremia   . Iron deficiency anemia   . Mitral insufficiency   . TIA (transient ischemic attack) 1997  . Tricuspid insufficiency      CURRENT MEDICATIONS: Reviewed  Patient's Medications  New Prescriptions   No medications on file  Previous Medications   ACETAMINOPHEN (TYLENOL) 325 MG TABLET    Take 650 mg by mouth at bedtime as needed for moderate pain.    AMIODARONE (PACERONE) 200 MG TABLET    Take 1 tablet (200 mg total)  by mouth daily.   CALCIUM CARBONATE (OS-CAL - DOSED IN MG OF ELEMENTAL CALCIUM) 1250 (500 CA) MG TABLET    Take 1 tablet by mouth every evening.    CARTEOLOL (OCUPRESS) 1 % OPHTHALMIC SOLUTION    Place 1 drop into both eyes 2 (two) times daily.   FUROSEMIDE (LASIX) 40 MG TABLET    Take 1 tablet (40 mg total) by mouth 2 (two) times daily.   HYDROCODONE-ACETAMINOPHEN (NORCO) 10-325 MG TABLET    Take 2 tablets by mouth every 6 (six) hours as needed for severe pain.   KLOR-CON M10 10 MEQ TABLET    TAKE 1 TABLET (10 MEQ TOTAL) BY MOUTH 2 (TWO) TIMES DAILY.   LATANOPROST (XALATAN) 0.005 % OPHTHALMIC SOLUTION    Place 1 drop into both eyes at bedtime.   METHOCARBAMOL (ROBAXIN) 500 MG TABLET    Take 1 tablet (500 mg total) by mouth every 6 (six) hours as needed for muscle spasms.   METOPROLOL SUCCINATE (TOPROL-XL) 25 MG 24 HR TABLET    Take 1 tablet (25 mg total) by mouth daily.   MULTIPLE VITAMIN (MULTIVITAMIN WITH MINERALS) TABS TABLET    Take 1 tablet by mouth every evening.    MUPIROCIN OINTMENT (BACTROBAN) 2 %    Place 1 application into the nose daily.   RIVAROXABAN (XARELTO) 15 MG  TABS TABLET    Take 1 tablet (15 mg total) by mouth daily with supper.   SACCHAROMYCES BOULARDII (FLORASTOR) 250 MG CAPSULE    Take 1 capsule (250 mg total) by mouth 2 (two) times daily.   VITAMIN C (ASCORBIC ACID) 500 MG TABLET    Take 500 mg by mouth daily with lunch.   Modified Medications   No medications on file  Discontinued Medications   No medications on file     Allergies  Allergen Reactions  . Lisinopril Cough     REVIEW OF SYSTEMS:  GENERAL: no change in appetite, no fatigue, no weight changes, no fever, chills or weakness EYES: Denies change in vision, dry eyes, eye pain, itching or discharge EARS: Denies change in hearing, ringing in ears, or earache NOSE: Denies nasal congestion or epistaxis MOUTH and THROAT: Denies oral discomfort, gingival pain or bleeding, pain from teeth or hoarseness     RESPIRATORY: no cough, SOB, DOE, wheezing, hemoptysis CARDIAC: no chest pain, edema or palpitations GI: no abdominal pain, diarrhea, heart burn, nausea or vomiting, +constipation GU: Denies dysuria, frequency, hematuria, incontinence, or discharge PSYCHIATRIC: Denies feeling of depression or anxiety. No report of hallucinations, insomnia, paranoia, or agitation    PHYSICAL EXAMINATION  GENERAL APPEARANCE: Well nourished. In no acute distress. Normal body habitus SKIN:  Skin is warm and dry. There are no suspicious lesions or rash HEAD: Normal in size and contour. No evidence of trauma EYES: Lids open and close normally. No blepharitis, entropion or ectropion. PERRL. Conjunctivae are clear and sclerae are white. Lenses are without opacity EARS: Pinnae are normal. Patient hears normal voice tunes of the examiner MOUTH and THROAT: Lips are without lesions. Oral mucosa is moist and without lesions. Tongue is normal in shape, size, and color and without lesions NECK: supple, trachea midline, no neck masses, no thyroid tenderness, no thyromegaly LYMPHATICS: no LAN in the neck, no supraclavicular LAN RESPIRATORY: breathing is even & unlabored, BS CTAB CARDIAC: RRR, no murmur,no extra heart sounds, no edema GI: abdomen soft, normal BS, no masses, no tenderness, no hepatomegaly, no splenomegaly EXTREMITIES:  Able to move 4 extremities PSYCHIATRIC: Alert and oriented X 3. Affect and behavior are appropriate  LABS/RADIOLOGY: Labs reviewed: Basic Metabolic Panel:  Recent Labs  91/47/82 0458 03/30/16 0518 06/09/16 2031 06/12/16 0501  NA 133* 133* 135 132*  K 4.1 4.0 3.8 4.1  CL 99* 98* 98* 98*  CO2 27 27  --  28  GLUCOSE 93 90 131* 97  BUN 18 13 23* 17  CREATININE 0.89 0.74 1.00 0.95  CALCIUM 8.8* 8.7*  --  8.6*   Liver Function Tests:  Recent Labs  03/22/16 1009 03/27/16 0851 03/28/16 0524  AST 21 29 31   ALT 20 26 29   ALKPHOS 70 89 75  BILITOT 0.4 0.6 0.5  PROT 6.1 7.3  7.0  ALBUMIN 4.1 4.6 4.3   CBC:  Recent Labs  11/02/15 2140 12/13/15 2346 03/27/16 0851  03/29/16 0458 03/30/16 0518 06/09/16 2031 06/12/16 0501  WBC 7.7 14.8* 11.1*  < > 10.5 8.2  --  7.7  NEUTROABS 5.6 13.8* 9.9*  --   --   --   --   --   HGB 12.4 11.7* 12.6  < > 10.5* 10.9* 12.2 11.0*  HCT 35.7* 34.2* 36.4  < > 30.0* 31.2* 36.0 32.3*  MCV 89.5 91.7 91.0  < > 89.3 91.8  --  93.4  PLT 189 156 213  < > 183 195  --  180  < > = values in this interval not displayed.    Ct Cervical Spine Wo Contrast  Result Date: 06/09/2016 CLINICAL DATA:  Neck pain after falling today.  Anticoagulated. EXAM: CT CERVICAL SPINE WITHOUT CONTRAST TECHNIQUE: Multidetector CT imaging of the cervical spine was performed without intravenous contrast. Multiplanar CT image reconstructions were also generated. COMPARISON:  None. FINDINGS: Alignment: Normal. Skull base and vertebrae: No acute fracture. No primary bone lesion or focal pathologic process. Soft tissues and spinal canal: No prevertebral fluid or swelling. No visible canal hematoma. Disc levels: Multilevel degenerative cervical disc disease, greatest at C5-6 and C6-7. Facet articulations are arthritic but otherwise intact. Mild degenerative anterolisthesis at C7-T1. Upper chest: No significant abnormality Other: None IMPRESSION: Cervical degenerative disc and facet changes. Negative for acute cervical spine fracture. No acute soft tissue abnormality. Electronically Signed   By: Ellery Plunk M.D.   On: 06/09/2016 22:34   Ct Abdomen Pelvis W Contrast  Result Date: 06/09/2016 CLINICAL DATA:  Left flank pain. Lumbar spine pain. Concern for retroperitoneal bleeding as the patient is anticoagulated for atrial fibrillation. EXAM: CT ABDOMEN AND PELVIS WITH CONTRAST TECHNIQUE: Multidetector CT imaging of the abdomen and pelvis was performed using the standard protocol following bolus administration of intravenous contrast. CONTRAST:  ISOVUE-300  IOPAMIDOL (ISOVUE-300) INJECTION 61% COMPARISON:  None. FINDINGS: Lower chest: No visible pleural or pericardial effusion. Small subpleural nodular opacity in the left lung base. Multifocal septal thickening. Hepatobiliary: Normal hepatic size and contours without focal liver lesion. No perihepatic ascites. No intra- or extrahepatic biliary dilatation. Normal gallbladder. Pancreas: Normal pancreatic contours and enhancement. No peripancreatic fluid collection or pancreatic ductal dilatation. Spleen: Normal. Adrenals/Urinary Tract: Normal adrenal glands. No hydronephrosis or solid renal mass. Stomach/Bowel: There is rectosigmoid diverticulosis without acute inflammation. No small bowel dilatation. No abdominal fluid collection. The appendix is not clearly visualized. There is no retroperitoneal hematoma. Vascular/Lymphatic: There is atherosclerotic calcification of the non aneurysmal abdominal aorta. No abdominal or pelvic adenopathy. Reproductive: Status post hysterectomy.  No adnexal mass. Musculoskeletal: Multilevel lumbar facet arthrosis and osteophyte formation. There is a compression fracture of the L1 vertebral body with approximately 25% height loss anteriorly with no stenosis-causing retropulsion. The fracture involves the superior endplate and the anterior and posterior walls. There is grade 1 anterolisthesis at L4-L5. Other: No contributory non-categorized findings. IMPRESSION: 1. Acute incomplete burst type fracture of the L1 vertebral body, involving the anterior and posterior walls and the superior endplate. Approximately 25% height loss but only minimal retropulsion without resultant stenosis. 2. No retroperitoneal hematoma. 3. Rectosigmoid diverticulosis without acute inflammation. Electronically Signed   By: Deatra Robinson M.D.   On: 06/09/2016 22:45    ASSESSMENT/PLAN:  Unsteady gait - for rehabilitation, PT and OT, for therapeutic strengthening exercises; fall precaution  L1 burst fracture  with stenosis - neurosurgery was consulted and recommended TLSO; follow-up with neurosurgery in 2 weeks; continue acetaminophen 325 mg 2 tabs = 650 mg by mouth daily at bedtime when necessary and Norco 10/325 mg 2 tabs by mouth every 6 hours when necessary for pain; Robaxin 500 mg 1 tab by mouth every 6 hours when necessary for muscle spasm  Atrial fibrillation - rate controlled; continue amiodarone 200 mg 1 tab by mouth daily and Xarelto 15 mg 1 tab by mouth daily  Chronic diastolic CHF - no SOB; continue Lasix 40 mg 1 tab by mouth twice a day  Hypokalemia - continue Klor-Con 10 meq 1 tab by mouth twice a day Lab  Results  Component Value Date   K 4.1 06/12/2016   Glaucoma - no complaints of eye pain; continue carteolol and latanoprost eyedrops  Hyponatremia - Na 132; check BMP  Anemia - recheck CBC Lab Results  Component Value Date   HGB 11.0 (L) 06/12/2016   Constipation - start MiraLAX 17 g by mouth twice a day, senna S 8.6-50 mg 2 tabs by mouth twice a day and lactulose 10 g/15 mL give 45 ML by mouth twice a day 3 days      Goals of care:  Short-term rehabilitation   Kenard GowerMonina Medina-Vargas - NP [facility] [phone]

## 2016-06-19 LAB — BASIC METABOLIC PANEL
BUN: 12 mg/dL (ref 4–21)
Creatinine: 0.8 mg/dL (ref 0.5–1.1)
Glucose: 102 mg/dL
Potassium: 3.8 mmol/L (ref 3.4–5.3)
Sodium: 139 mmol/L (ref 137–147)

## 2016-06-19 LAB — CBC AND DIFFERENTIAL
HCT: 36 % (ref 36–46)
Hemoglobin: 12.1 g/dL (ref 12.0–16.0)
Neutrophils Absolute: 5 /uL
Platelets: 240 10*3/uL (ref 150–399)
WBC: 6.8 10^3/mL

## 2016-06-20 ENCOUNTER — Non-Acute Institutional Stay (SKILLED_NURSING_FACILITY): Payer: Medicare Other | Admitting: Internal Medicine

## 2016-06-20 ENCOUNTER — Encounter: Payer: Self-pay | Admitting: Internal Medicine

## 2016-06-20 DIAGNOSIS — S32001S Stable burst fracture of unspecified lumbar vertebra, sequela: Secondary | ICD-10-CM

## 2016-06-20 DIAGNOSIS — I5032 Chronic diastolic (congestive) heart failure: Secondary | ICD-10-CM

## 2016-06-20 DIAGNOSIS — R2681 Unsteadiness on feet: Secondary | ICD-10-CM

## 2016-06-20 DIAGNOSIS — I10 Essential (primary) hypertension: Secondary | ICD-10-CM

## 2016-06-20 DIAGNOSIS — K5901 Slow transit constipation: Secondary | ICD-10-CM

## 2016-06-20 DIAGNOSIS — I480 Paroxysmal atrial fibrillation: Secondary | ICD-10-CM

## 2016-06-20 DIAGNOSIS — E871 Hypo-osmolality and hyponatremia: Secondary | ICD-10-CM

## 2016-06-20 NOTE — Progress Notes (Signed)
LOCATION: [facility]  PCP: Lorenda Peck, MD   Code Status: Full Code  Goals of care: Advanced Directive information Advanced Directives 06/10/2016  Does Patient Have a Medical Advance Directive? Yes  Type of Advance Directive Healthcare Power of Attorney  Does patient want to make changes to medical advance directive? No - Patient declined  Copy of Healthcare Power of Attorney in Chart? Yes       Extended Emergency Contact Information Primary Emergency Contact: Zurn,Carolyn  [place] Home Phone: [phone] Relation: Relative Secondary Emergency Contact: Vien,Billy Address: [address]          [place], [place] [address] [place] Home Phone: [phone] Relation: Spouse   Allergies  Allergen Reactions  . Lisinopril Cough    Chief Complaint  Patient presents with  . New Admit To SNF    New Admission Visit     HPI:  Patient is a 82 y.o. female seen today for short term rehabilitation post hospital admission from 06/09/16-06/14/16 with L1 acute burst fracture. Neurosurgery was consulted and no surgery was recommended. She was advised to use TLSO brace and pain medication. She is seen in her room today. Her pain is under control with current pain regimen. Movement aggrevates her pain.   Review of Systems:  Constitutional: Negative for fever, chills, diaphoresis.  HENT: Negative for headache, congestion, nasal discharge, difficulty swallowing.   Eyes: Negative for double vision and discharge. Wears glasses.  Respiratory: Negative for cough, shortness of breath and wheezing.   Cardiovascular: Negative for chest pain, palpitations, leg swelling.  Gastrointestinal: Negative for heartburn, vomiting, abdominal pain. Has poor appetite and has been nauseous. Last bowel movement was yesterday. Genitourinary: Negative for dysuria and flank pain.  Musculoskeletal: Negative for back pain, fall in the facility.  Skin: Negative  for itching, rash.  Neurological: Positive for dizziness  Psychiatric/Behavioral: Negative for depression.   Past Medical History:  Diagnosis Date  . Atrial fibrillation (HCC)   . Chronic diastolic CHF (congestive heart failure) (HCC)   . Glaucoma   . Hypertension   . Hyponatremia   . Iron deficiency anemia   . Mitral insufficiency   . TIA (transient ischemic attack) 1997  . Tricuspid insufficiency    Past Surgical History:  Procedure Laterality Date  . ABDOMINAL HYSTERECTOMY    . CARDIOVERSION N/A 09/15/2015   Procedure: CARDIOVERSION;  Surgeon: Wendall Stade, MD;  Location: [facility] ENDOSCOPY;  Service: Cardiovascular;  Laterality: N/A;  . CATARACT EXTRACTION    . DIAGNOSTIC LAPAROSCOPY    . STRABISMUS SURGERY    . TONSILLECTOMY     Social History:   reports that she has never smoked. She has never used smokeless tobacco. She reports that she drinks alcohol. She reports that she does not use drugs.  Family History  Problem Relation Age of Onset  . Heart disease Mother   . Ovarian cancer Mother 17  . Heart attack Father 74  . Heart attack Brother   . Heart attack Son 69  . Hypertension Son     Medications:   Medication List       Accurate as of 06/20/16  2:41 PM. Always use your most recent med list.          acetaminophen 325 MG tablet Commonly known as:  TYLENOL Take 650 mg by mouth at bedtime as needed for moderate pain.   amiodarone 200 MG tablet Commonly known as:  PACERONE Take 1 tablet (200 mg total)  by mouth daily.   calcium carbonate 1250 (500 Ca) MG tablet Commonly known as:  OS-CAL - dosed in mg of elemental calcium Take 1 tablet by mouth every evening.   carteolol 1 % ophthalmic solution Commonly known as:  OCUPRESS Place 1 drop into both eyes 2 (two) times daily.   furosemide 40 MG tablet Commonly known as:  LASIX Take 1 tablet (40 mg total) by mouth 2 (two) times daily.   HYDROcodone-acetaminophen 10-325 MG tablet Commonly known as:   NORCO Take 2 tablets by mouth every 6 (six) hours as needed for severe pain.   KLOR-CON M10 10 MEQ tablet Generic drug:  potassium chloride TAKE 1 TABLET (10 MEQ TOTAL) BY MOUTH 2 (TWO) TIMES DAILY.   latanoprost 0.005 % ophthalmic solution Commonly known as:  XALATAN Place 1 drop into both eyes at bedtime.   methocarbamol 500 MG tablet Commonly known as:  ROBAXIN Take 1 tablet (500 mg total) by mouth every 6 (six) hours as needed for muscle spasms.   metoprolol succinate 25 MG 24 hr tablet Commonly known as:  TOPROL-XL Take 1 tablet (25 mg total) by mouth daily.   multivitamin with minerals Tabs tablet Take 1 tablet by mouth every evening.   mupirocin ointment 2 % Commonly known as:  BACTROBAN Place 1 application into the nose daily.   polyethylene glycol packet Commonly known as:  MIRALAX / GLYCOLAX Take 17 g by mouth 2 (two) times daily.   Rivaroxaban 15 MG Tabs tablet Commonly known as:  XARELTO Take 1 tablet (15 mg total) by mouth daily with supper.   saccharomyces boulardii 250 MG capsule Commonly known as:  FLORASTOR Take 1 capsule (250 mg total) by mouth 2 (two) times daily.   sennosides-docusate sodium 8.6-50 MG tablet Commonly known as:  SENOKOT-S Take 2 tablets by mouth 2 (two) times daily.   UNABLE TO FIND Med Name: Med Pass 120 mL by mouth 2 times daily   vitamin C 500 MG tablet Commonly known as:  ASCORBIC ACID Take 500 mg by mouth daily with lunch.       Immunizations: Immunization History  Administered Date(s) Administered  . PPD Test 06/14/2016     Physical Exam: Vitals:   06/20/16 1437  BP: 132/76  Pulse: 74  Resp: 18  Temp: 98.6 F (37 C)  TempSrc: Oral  SpO2: 98%  Weight: 121 lb 12.8 oz (55.2 kg)  Height: 5' (1.524 m)   Body mass index is 23.79 kg/m.  General- elderly female, well built, in no acute distress Head- normocephalic, atraumatic Nose- no maxillary or frontal sinus tenderness, no nasal discharge Throat- moist  mucus membrane  Eyes- PERRLA, EOMI, no pallor, no icterus, no discharge, normal conjunctiva, normal sclera Neck- no cervical lymphadenopathy Cardiovascular- normal s1,s2, no murmur, trace leg edema Respiratory- bilateral clear to auscultation, no wheeze, no rhonchi, no crackles, no use of accessory muscles Abdomen- bowel sounds present, soft, non tender Musculoskeletal- able to move all 4 extremities, generalized weakness Neurological- alert and oriented to person, place and time Skin- warm and dry, chronic skin changes to her lower legs Psychiatry- normal mood and affect    Labs reviewed: Basic Metabolic Panel:  Recent Labs  16/10/96 0458 03/30/16 0518 06/09/16 2031 06/12/16 0501  NA 133* 133* 135 132*  K 4.1 4.0 3.8 4.1  CL 99* 98* 98* 98*  CO2 27 27  --  28  GLUCOSE 93 90 131* 97  BUN 18 13 23* 17  CREATININE 0.89 0.74 1.00  0.95  CALCIUM 8.8* 8.7*  --  8.6*   Liver Function Tests:  Recent Labs  03/22/16 1009 03/27/16 0851 03/28/16 0524  AST 21 29 31   ALT 20 26 29   ALKPHOS 70 89 75  BILITOT 0.4 0.6 0.5  PROT 6.1 7.3 7.0  ALBUMIN 4.1 4.6 4.3   No results for input(s): LIPASE, AMYLASE in the last 8760 hours. No results for input(s): AMMONIA in the last 8760 hours. CBC:  Recent Labs  11/02/15 2140 12/13/15 2346 03/27/16 0851  03/29/16 0458 03/30/16 0518 06/09/16 2031 06/12/16 0501  WBC 7.7 14.8* 11.1*  < > 10.5 8.2  --  7.7  NEUTROABS 5.6 13.8* 9.9*  --   --   --   --   --   HGB 12.4 11.7* 12.6  < > 10.5* 10.9* 12.2 11.0*  HCT 35.7* 34.2* 36.4  < > 30.0* 31.2* 36.0 32.3*  MCV 89.5 91.7 91.0  < > 89.3 91.8  --  93.4  PLT 189 156 213  < > 183 195  --  180  < > = values in this interval not displayed. Cardiac Enzymes: No results for input(s): CKTOTAL, CKMB, CKMBINDEX, TROPONINI in the last 8760 hours. BNP: Invalid input(s): POCBNP CBG: No results for input(s): GLUCAP in the last 8760 hours.  Radiological Exams: Ct Cervical Spine Wo  Contrast  Result Date: 06/09/2016 CLINICAL DATA:  Neck pain after falling today.  Anticoagulated. EXAM: CT CERVICAL SPINE WITHOUT CONTRAST TECHNIQUE: Multidetector CT imaging of the cervical spine was performed without intravenous contrast. Multiplanar CT image reconstructions were also generated. COMPARISON:  None. FINDINGS: Alignment: Normal. Skull base and vertebrae: No acute fracture. No primary bone lesion or focal pathologic process. Soft tissues and spinal canal: No prevertebral fluid or swelling. No visible canal hematoma. Disc levels: Multilevel degenerative cervical disc disease, greatest at C5-6 and C6-7. Facet articulations are arthritic but otherwise intact. Mild degenerative anterolisthesis at C7-T1. Upper chest: No significant abnormality Other: None IMPRESSION: Cervical degenerative disc and facet changes. Negative for acute cervical spine fracture. No acute soft tissue abnormality. Electronically Signed   By: Ellery Plunkaniel R Mitchell M.D.   On: 06/09/2016 22:34   Ct Abdomen Pelvis W Contrast  Result Date: 06/09/2016 CLINICAL DATA:  Left flank pain. Lumbar spine pain. Concern for retroperitoneal bleeding as the patient is anticoagulated for atrial fibrillation. EXAM: CT ABDOMEN AND PELVIS WITH CONTRAST TECHNIQUE: Multidetector CT imaging of the abdomen and pelvis was performed using the standard protocol following bolus administration of intravenous contrast. CONTRAST:  100mL ISOVUE-300 IOPAMIDOL (ISOVUE-300) INJECTION 61% COMPARISON:  None. FINDINGS: Lower chest: No visible pleural or pericardial effusion. Small subpleural nodular opacity in the left lung base. Multifocal septal thickening. Hepatobiliary: Normal hepatic size and contours without focal liver lesion. No perihepatic ascites. No intra- or extrahepatic biliary dilatation. Normal gallbladder. Pancreas: Normal pancreatic contours and enhancement. No peripancreatic fluid collection or pancreatic ductal dilatation. Spleen: Normal.  Adrenals/Urinary Tract: Normal adrenal glands. No hydronephrosis or solid renal mass. Stomach/Bowel: There is rectosigmoid diverticulosis without acute inflammation. No small bowel dilatation. No abdominal fluid collection. The appendix is not clearly visualized. There is no retroperitoneal hematoma. Vascular/Lymphatic: There is atherosclerotic calcification of the non aneurysmal abdominal aorta. No abdominal or pelvic adenopathy. Reproductive: Status post hysterectomy.  No adnexal mass. Musculoskeletal: Multilevel lumbar facet arthrosis and osteophyte formation. There is a compression fracture of the L1 vertebral body with approximately 25% height loss anteriorly with no stenosis-causing retropulsion. The fracture involves the superior endplate and the anterior and  posterior walls. There is grade 1 anterolisthesis at L4-L5. Other: No contributory non-categorized findings. IMPRESSION: 1. Acute incomplete burst type fracture of the L1 vertebral body, involving the anterior and posterior walls and the superior endplate. Approximately 25% height loss but only minimal retropulsion without resultant stenosis. 2. No retroperitoneal hematoma. 3. Rectosigmoid diverticulosis without acute inflammation. Electronically Signed   By: Deatra RobinsonKevin  Herman M.D.   On: 06/09/2016 22:45    Assessment/Plan  Gait instability Will have patient work with PT/OT as tolerated to regain strength and restore function.  Fall precautions are in place.  L1 burst fracture with stenosis To wear TLSO brace when out of bed. Has neurosurgery follow up. Continue tylenol 650 mg qhs prn and norco 10-325 mg 2 tab q6h prn pain. Continue robaxin 500 mg q6h prn muscle spasm. Will have patient work with PT/OT as tolerated to regain strength and restore function.  Fall precautions are in place.  Constipation On miralax bid, senna s 2 tab bid and lactulose. Had bowel movement yesterday. Change miralax to 17 g daily and discontinue lactulose.   Atrial  fibrillation Rate controlled. Continue her toprol xl and amiodarone with xarelto for stroke prophylaxis.   HTN Monitor BP. Continue toprol xl.   Chronic diastolic chf Continue toprol xl and lasix, monitor bmp. Continue kcl supplement.   Hyponatremia Monitor BMP with her on lasix    Goals of care: short term rehabilitation   Labs/tests ordered: cbc, bmp  Family/ staff Communication: reviewed care plan with patient and nursing supervisor    Oneal GroutMAHIMA Ambrose Wile, MD Internal Medicine [facility] [address] [place], [place] [address] Cell Phone (Monday-Friday 8 am - 5 pm): [phone] On Call: [phone] and follow prompts after 5 pm and on weekends Office Phone: [phone] Office Fax: [phone]

## 2016-06-21 LAB — BASIC METABOLIC PANEL
BUN: 14 mg/dL (ref 4–21)
Creatinine: 0.9 mg/dL (ref 0.5–1.1)
Glucose: 93 mg/dL
Potassium: 4 mmol/L (ref 3.4–5.3)
Sodium: 140 mmol/L (ref 137–147)

## 2016-06-25 ENCOUNTER — Other Ambulatory Visit: Payer: Self-pay | Admitting: Cardiology

## 2016-06-26 ENCOUNTER — Telehealth: Payer: Self-pay

## 2016-06-26 NOTE — Telephone Encounter (Signed)
Received surgical clearance from Dr.Murphy.Dr.Jordan cleared pt for upcoming surgery.Advised ok to hold Xarelto 48 hours prior to surgery.Form faxed back to fax # [phone].

## 2016-06-27 ENCOUNTER — Other Ambulatory Visit: Payer: Self-pay | Admitting: *Deleted

## 2016-06-27 MED ORDER — HYDROCODONE-ACETAMINOPHEN 10-325 MG PO TABS: ORAL_TABLET | ORAL | 0 refills | Status: DC

## 2016-06-27 NOTE — Telephone Encounter (Signed)
[facility] #[phone] Fax: [phone] 

## 2016-07-10 ENCOUNTER — Non-Acute Institutional Stay (SKILLED_NURSING_FACILITY): Payer: Medicare Other | Admitting: Adult Health

## 2016-07-10 DIAGNOSIS — R05 Cough: Secondary | ICD-10-CM | POA: Diagnosis not present

## 2016-07-10 DIAGNOSIS — R059 Cough, unspecified: Secondary | ICD-10-CM

## 2016-07-10 DIAGNOSIS — I5032 Chronic diastolic (congestive) heart failure: Secondary | ICD-10-CM

## 2016-07-10 DIAGNOSIS — N390 Urinary tract infection, site not specified: Secondary | ICD-10-CM | POA: Diagnosis not present

## 2016-07-10 DIAGNOSIS — R319 Hematuria, unspecified: Secondary | ICD-10-CM | POA: Diagnosis not present

## 2016-07-10 NOTE — Progress Notes (Signed)
DATE:  07/10/2016   MRN:  [id number]  BIRTHDAY: 1924-03-25  Facility:  Nursing Home Location:  [facility]     LEVEL OF CARE:  SNF [phone](31)  Contact Information    Name Relation Home Work Mobile   Boys,Carolyn Relative [phone]     Janalee DaneSmith,Billy Spouse [phone]     Kerr,Richard Relative [phone]     Watt ClimesSmith,Bryan Son [phone]         Code Status History    Date Active Date Inactive Code Status Order ID Comments User Context   06/09/2016 11:27 PM 06/14/2016  5:35 PM Full Code [id number]  Hillary BowJared M Gardner, DO ED   03/27/2016 11:47 AM 03/30/2016  5:31 PM Full Code [id number]  Coralie KeensMauricio Daniel Arrien, MD Inpatient   06/01/2015  2:34 PM 06/10/2015  9:36 PM Full Code [id number]  Orpah CobbAjay Kadakia, MD ED       Chief Complaint  Patient presents with  . Acute Visit    UTI, Cough    HISTORY OF PRESENT ILLNESS:  This is a 89-yea-old female who has been complaining of pressure on her lower abdomen. She had episode of hematuria last week but is now having clear urine. No fever has been reported. She has been noted to have productive cough. She said that she is intolerant to a lot of antibiotics and would get shots instead. Urine culture shows greater than 100,000 CFU/mL Escherichia coli. Chest x-ray shows no acute pulmonary abnormality.  [facility]14[facility]16 from [facility] hospitalization 06/09/16 to 06/13/16. She has L1 burst fracture with stenosis. Neurosurgery was consulted and recommended TLSO. No surgery needed and will follow-up as an outpatient.    PAST MEDICAL HISTORY:  Past Medical History:  Diagnosis Date  . Atrial fibrillation (HCC)   . Chronic diastolic CHF (congestive heart failure) (HCC)   . Glaucoma   . Hypertension   . Hyponatremia   . Iron deficiency anemia   . Mitral insufficiency   . TIA (transient ischemic attack) 1997  . Tricuspid insufficiency      CURRENT MEDICATIONS: Reviewed  Patient's  Medications  New Prescriptions   No medications on file  Previous Medications   ACETAMINOPHEN (TYLENOL) 325 MG TABLET    Take 650 mg by mouth at bedtime as needed for moderate pain.    AMIODARONE (PACERONE) 200 MG TABLET    Take 1 tablet (200 mg total) by mouth daily.   CALCIUM CARBONATE (OS-CAL - DOSED IN MG OF ELEMENTAL CALCIUM) 1250 (500 CA) MG TABLET    Take 1 tablet by mouth every evening.    CARTEOLOL (OCUPRESS) 1 % OPHTHALMIC SOLUTION    Place 1 drop into both eyes 2 (two) times daily.   FUROSEMIDE (LASIX) 40 MG TABLET    Take 1 tablet (40 mg total) by mouth 2 (two) times daily.   HYDROCODONE-ACETAMINOPHEN (NORCO) 10-325 MG TABLET    Take one tablet by mouth every 4 hours as needed for moderate to severe pain. Hold for sedation. Do not exceed 3gm of Tylenol in 24 hours   KLOR-CON M10 10 MEQ TABLET    TAKE 1 TABLET (10 MEQ TOTAL) BY MOUTH 2 (TWO) TIMES DAILY.   LATANOPROST (XALATAN) 0.005 % OPHTHALMIC SOLUTION    Place 1 drop into both eyes at bedtime.   METHOCARBAMOL (ROBAXIN) 500 MG TABLET    Take 1 tablet (500 mg total) by mouth every 6 (six) hours as needed for muscle spasms.  METOPROLOL SUCCINATE (TOPROL-XL) 25 MG 24 HR TABLET    TAKE 1 TABLET (25 MG TOTAL) BY MOUTH DAILY.   MULTIPLE VITAMIN (MULTIVITAMIN WITH MINERALS) TABS TABLET    Take 1 tablet by mouth every evening.    POLYETHYLENE GLYCOL (MIRALAX / GLYCOLAX) PACKET    Take 17 g by mouth 2 (two) times daily.   RIVAROXABAN (XARELTO) 15 MG TABS TABLET    Take 1 tablet (15 mg total) by mouth daily with supper.   SACCHAROMYCES BOULARDII (FLORASTOR) 250 MG CAPSULE    Take 1 capsule (250 mg total) by mouth 2 (two) times daily.   SENNOSIDES-DOCUSATE SODIUM (SENOKOT-S) 8.6-50 MG TABLET    Take 2 tablets by mouth 2 (two) times daily.   UNABLE TO FIND    Med Name: Med Pass 120 mL by mouth 2 times daily   VITAMIN C (ASCORBIC ACID) 500 MG TABLET    Take 500 mg by mouth daily with lunch.   Modified Medications   No medications on file    Discontinued Medications   No medications on file     Allergies  Allergen Reactions  . Lisinopril Cough     REVIEW OF SYSTEMS:  GENERAL: no change in appetite, no fatigue, no weight changes, no fever, chills or weakness EYES: Denies change in vision, dry eyes, eye pain, itching or discharge EARS: Denies change in hearing, ringing in ears, or earache NOSE: Denies nasal congestion or epistaxis MOUTH and THROAT: Denies oral discomfort, gingival pain or bleeding, pain from teeth or hoarseness   RESPIRATORY: no SOB, DOE, wheezing, hemoptysis, + cough CARDIAC: no chest pain, edema or palpitations GI: no abdominal pain, diarrhea, heart burn, nausea or vomiting, +constipation GU: Denies frequency, incontinence, or discharge, + dysuria and hematuria PSYCHIATRIC: Denies feeling of depression or anxiety. No report of hallucinations, insomnia, paranoia, or agitation    PHYSICAL EXAMINATION  GENERAL APPEARANCE: Well nourished. In no acute distress. Normal body habitus SKIN:  Skin is warm and dry. There are no suspicious lesions or rash HEAD: Normal in size and contour. No evidence of trauma EYES: Lids open and close normally. No blepharitis, entropion or ectropion. PERRL. Conjunctivae are clear and sclerae are white. Lenses are without opacity EARS: Pinnae are normal. Patient hears normal voice tunes of the examiner MOUTH and THROAT: Lips are without lesions. Oral mucosa is moist and without lesions. Tongue is normal in shape, size, and color and without lesions NECK: supple, trachea midline, no neck masses, no thyroid tenderness, no thyromegaly LYMPHATICS: no LAN in the neck, no supraclavicular LAN RESPIRATORY: breathing is even & unlabored, BS CTAB CARDIAC: RRR, no murmur,no extra heart sounds, no edema GI: abdomen soft, normal BS, no masses, no tenderness, no hepatomegaly, no splenomegaly EXTREMITIES:  Able to move 4 extremities PSYCHIATRIC: Alert and oriented X 3. Affect and  behavior are appropriate  LABS/RADIOLOGY: Labs reviewed: Basic Metabolic Panel:  Recent Labs  16/10/96 0458 03/30/16 0518 06/09/16 2031 06/12/16 0501  NA 133* 133* 135 132*  K 4.1 4.0 3.8 4.1  CL 99* 98* 98* 98*  CO2 27 27  --  28  GLUCOSE 93 90 131* 97  BUN 18 13 23* 17  CREATININE 0.89 0.74 1.00 0.95  CALCIUM 8.8* 8.7*  --  8.6*   Liver Function Tests:  Recent Labs  03/22/16 1009 03/27/16 0851 03/28/16 0524  AST 21 29 31   ALT 20 26 29   ALKPHOS 70 89 75  BILITOT 0.4 0.6 0.5  PROT 6.1 7.3 7.0  ALBUMIN  4.1 4.6 4.3   CBC:  Recent Labs  11/02/15 2140 12/13/15 2346 03/27/16 0851  03/29/16 0458 03/30/16 0518 06/09/16 2031 06/12/16 0501  WBC 7.7 14.8* 11.1*  < > 10.5 8.2  --  7.7  NEUTROABS 5.6 13.8* 9.9*  --   --   --   --   --   HGB 12.4 11.7* 12.6  < > 10.5* 10.9* 12.2 11.0*  HCT 35.7* 34.2* 36.4  < > 30.0* 31.2* 36.0 32.3*  MCV 89.5 91.7 91.0  < > 89.3 91.8  --  93.4  PLT 189 156 213  < > 183 195  --  180  < > = values in this interval not displayed.     ASSESSMENT/PLAN:  UTI - start ceftriaxone 1 g IM daily 5 days and Florastor 250 mg 1 capsule by mouth twice a day 8 days  Cough - start Robitussin DM 10 ml PO Q 6AM, 2PM and 10PM X 2 weeks; encourage to use incentive spirometry while awake  Chronic diastolic CHF - no SOB; continue Lasix 40 mg 1 tab PO BID and Toprol-XL 25 mg 1 tab by mouth daily     Kenard GowerMonina Medina-Vargas - NP [facility] [phone]

## 2016-07-13 ENCOUNTER — Encounter: Payer: Self-pay | Admitting: Adult Health

## 2016-07-13 ENCOUNTER — Non-Acute Institutional Stay (SKILLED_NURSING_FACILITY): Payer: Medicare Other | Admitting: Adult Health

## 2016-07-13 DIAGNOSIS — I480 Paroxysmal atrial fibrillation: Secondary | ICD-10-CM

## 2016-07-13 DIAGNOSIS — I10 Essential (primary) hypertension: Secondary | ICD-10-CM | POA: Diagnosis not present

## 2016-07-13 DIAGNOSIS — I5032 Chronic diastolic (congestive) heart failure: Secondary | ICD-10-CM

## 2016-07-13 DIAGNOSIS — N390 Urinary tract infection, site not specified: Secondary | ICD-10-CM

## 2016-07-13 DIAGNOSIS — E876 Hypokalemia: Secondary | ICD-10-CM | POA: Diagnosis not present

## 2016-07-13 DIAGNOSIS — R2681 Unsteadiness on feet: Secondary | ICD-10-CM

## 2016-07-13 DIAGNOSIS — H409 Unspecified glaucoma: Secondary | ICD-10-CM

## 2016-07-13 DIAGNOSIS — K5901 Slow transit constipation: Secondary | ICD-10-CM | POA: Diagnosis not present

## 2016-07-13 DIAGNOSIS — S32001S Stable burst fracture of unspecified lumbar vertebra, sequela: Secondary | ICD-10-CM | POA: Diagnosis not present

## 2016-07-13 NOTE — Progress Notes (Signed)
DATE:  07/13/2016   MRN:  [id number]  BIRTHDAY: 05-09-1924  Facility:  Nursing Home Location:  [facility]  Nursing Home Room Number: [id number]  LEVEL OF CARE:  SNF (31)  Contact Information    Name Relation Home Work Mobile   E[place]ton,Carolyn Relative [phone]     Fedorchak,Billy Spouse [phone]     Kerr,Richard Relative [phone]     Watt ClimesSmith,Bryan Son [phone]         Code Status History    Date Active Date Inactive Code Status Order ID Comments User Context   06/09/2016 11:27 PM 06/14/2016  5:35 PM Full Code [id number]  Hillary BowJared M Gardner, DO ED   03/27/2016 11:47 AM 03/30/2016  5:31 PM Full Code [id number]  Coralie KeensMauricio Daniel Arrien, MD Inpatient   06/01/2015  2:34 PM 06/10/2015  9:36 PM Full Code [id number]  Orpah CobbAjay Kadakia, MD ED       Chief Complaint  Patient presents with  . Medical Management of Chronic Issues    HISTORY OF PRESENT ILLNESS:  This is a 89-yea-old female who is being seen for a routine visit. She is currently having a short-term rehabilitation @ [facility]. She was recently started on Ceftriaxone for UTI. She is currently being followed-up by physiatry for pain management.  [facility] 06/09/16 to 06/13/16. She has L1 burst fracture with stenosis. Neurosurgery was consulted and recommended TLSO. No surgery needed and will follow-up as an outpatient.    PAST MEDICAL HISTORY:  Past Medical History:  Diagnosis Date  . Atrial fibrillation (HCC)   . Chronic diastolic CHF (congestive heart failure) (HCC)   . Glaucoma   . Hypertension   . Hyponatremia   . Iron deficiency anemia   . Mitral insufficiency   . TIA (transient ischemic attack) 1997  . Tricuspid insufficiency      CURRENT MEDICATIONS: Reviewed  Patient's Medications  New Prescriptions   No medications on file  Previous Medications   ACETAMINOPHEN (TYLENOL) 325 MG TABLET    Take 650 mg by  mouth at bedtime as needed for moderate pain.    AMIODARONE (PACERONE) 200 MG TABLET    Take 1 tablet (200 mg total) by mouth daily.   CALCIUM CARBONATE (OS-CAL - DOSED IN MG OF ELEMENTAL CALCIUM) 1250 (500 CA) MG TABLET    Take 1 tablet by mouth every evening.    CARTEOLOL (OCUPRESS) 1 % OPHTHALMIC SOLUTION    Place 1 drop into both eyes 2 (two) times daily.   CEFTRIAXONE (ROCEPHIN) 1 G INJECTION    Inject 1 g into the muscle daily.   FUROSEMIDE (LASIX) 40 MG TABLET    Take 1 tablet (40 mg total) by mouth 2 (two) times daily.   GUAIFENESIN-DEXTROMETHORPHAN (ROBITUSSIN DM) 100-10 MG/5ML SYRUP    Take 10 mLs by mouth every 8 (eight) hours as needed for cough. Give at 6A, 2P, 10P x2 weeks   HYDROCODONE-ACETAMINOPHEN (NORCO) 10-325 MG TABLET    Take 1 tablet by mouth every 8 (eight) hours as needed for moderate pain or severe pain.   KLOR-CON M10 10 MEQ TABLET    TAKE 1 TABLET (10 MEQ TOTAL) BY MOUTH 2 (TWO) TIMES DAILY.   LATANOPROST (XALATAN) 0.005 % OPHTHALMIC SOLUTION    Place 1 drop into both eyes at bedtime.   MENTHOL (ICY HOT) 5 % PTCH    Apply 1 patch topically every morning. Apply to left knee at  8AM and removed qhs   METHOCARBAMOL (ROBAXIN) 500 MG TABLET    Take 1 tablet (500 mg total) by mouth every 6 (six) hours as needed for muscle spasms.   METOPROLOL SUCCINATE (TOPROL-XL) 25 MG 24 HR TABLET    TAKE 1 TABLET (25 MG TOTAL) BY MOUTH DAILY.   MULTIPLE VITAMIN (MULTIVITAMIN WITH MINERALS) TABS TABLET    Take 1 tablet by mouth every evening.    POLYETHYLENE GLYCOL (MIRALAX / GLYCOLAX) PACKET    Take 17 g by mouth daily.    RIVAROXABAN (XARELTO) 15 MG TABS TABLET    Take 1 tablet (15 mg total) by mouth daily with supper.   SACCHAROMYCES BOULARDII (FLORASTOR) 250 MG CAPSULE    Take 1 capsule (250 mg total) by mouth 2 (two) times daily.   SENNOSIDES-DOCUSATE SODIUM (SENOKOT-S) 8.6-50 MG TABLET    Take 2 tablets by mouth 2 (two) times daily.   UNABLE TO FIND    Med Name: Med Pass 120 mL by mouth  2 times daily   VITAMIN C (ASCORBIC ACID) 500 MG TABLET    Take 500 mg by mouth daily with lunch.   Modified Medications   No medications on file  Discontinued Medications   HYDROCODONE-ACETAMINOPHEN (NORCO) 10-325 MG TABLET    Take one tablet by mouth every 4 hours as needed for moderate to severe pain. Hold for sedation. Do not exceed 3gm of Tylenol in 24 hours     Allergies  Allergen Reactions  . Lisinopril Cough     REVIEW OF SYSTEMS:  GENERAL: no change in appetite, no fatigue, no weight changes, no fever, chills or weakness EYES: Denies change in vision, dry eyes, eye pain, itching or discharge EARS: Denies change in hearing, ringing in ears, or earache NOSE: Denies nasal congestion or epistaxis MOUTH and THROAT: Denies oral discomfort, gingival pain or bleeding, pain from teeth or hoarseness   RESPIRATORY: no cough, SOB, DOE, wheezing, hemoptysis CARDIAC: no chest pain, edema or palpitations GI: no abdominal pain, diarrhea, heart burn, nausea or vomiting, +constipation GU: Denies dysuria, frequency, hematuria, incontinence, or discharge PSYCHIATRIC: Denies feeling of depression or anxiety. No report of hallucinations, insomnia, paranoia, or agitation    PHYSICAL EXAMINATION  GENERAL APPEARANCE: Well nourished. In no acute distress. Normal body habitus SKIN:  Skin is warm and dry. There are no suspicious lesions or rash HEAD: Normal in size and contour. No evidence of trauma EYES: Lids open and close normally. No blepharitis, entropion or ectropion. PERRL. Conjunctivae are clear and sclerae are white. Lenses are without opacity EARS: Pinnae are normal. Patient hears normal voice tunes of the examiner MOUTH and THROAT: Lips are without lesions. Oral mucosa is moist and without lesions. Tongue is normal in shape, size, and color and without lesions NECK: supple, trachea midline, no neck masses, no thyroid tenderness, no thyromegaly LYMPHATICS: no LAN in the neck, no  supraclavicular LAN RESPIRATORY: breathing is even & unlabored, BS CTAB CARDIAC: RRR, no murmur,no extra heart sounds, no edema GI: abdomen soft, normal BS, no masses, no tenderness, no hepatomegaly, no splenomegaly EXTREMITIES:  Able to move 4 extremities; has TLSO brace PSYCHIATRIC: Alert and oriented X 3. Affect and behavior are appropriate  LABS/RADIOLOGY: Labs reviewed: Basic Metabolic Panel:  Recent Labs  16/10/96 0458 03/30/16 0518 06/09/16 2031 06/12/16 0501 06/19/16 06/21/16  NA 133* 133* 135 132* 139 140  K 4.1 4.0 3.8 4.1 3.8 4.0  CL 99* 98* 98* 98*  --   --   CO2 27  27  --  28  --   --   GLUCOSE 93 90 131* 97  --   --   BUN 18 13 23* 17 12 14   CREATININE 0.89 0.74 1.00 0.95 0.8 0.9  CALCIUM 8.8* 8.7*  --  8.6*  --   --    Liver Function Tests:  Recent Labs  03/22/16 1009 03/27/16 0851 03/28/16 0524  AST 21 29 31   ALT 20 26 29   ALKPHOS 70 89 75  BILITOT 0.4 0.6 0.5  PROT 6.1 7.3 7.0  ALBUMIN 4.1 4.6 4.3   CBC:  Recent Labs  12/13/15 2346 03/27/16 0851  03/29/16 0458 03/30/16 0518 06/09/16 2031 06/12/16 0501 06/19/16  WBC 14.8* 11.1*  < > 10.5 8.2  --  7.7 6.8  NEUTROABS 13.8* 9.9*  --   --   --   --   --  5  HGB 11.7* 12.6  < > 10.5* 10.9* 12.2 11.0* 12.1  HCT 34.2* 36.4  < > 30.0* 31.2* 36.0 32.3* 36  MCV 91.7 91.0  < > 89.3 91.8  --  93.4  --   PLT 156 213  < > 183 195  --  180 240  < > = values in this interval not displayed.     ASSESSMENT/PLAN:  Unsteady gait - continue rehabilitation, PT and OT, for therapeutic strengthening exercises; fall precaution  L1 burst fracture with stenosis - continue TLSO; follow-up with neurosurgery ; continue acetaminophen 325 mg 2 tabs = 650 mg by mouth daily at bedtime when necessary and Norco 10/325 mg 2 tabs by mouth every 8 hours when necessary for pain; Robaxin 500 mg 1 tab by mouth every 6 hours when necessary for muscle spasm  Atrial fibrillation - rate controlled; continue amiodarone 200 mg 1  tab by mouth daily, Toprol XL 25 mg 1 tab PO Q D and Xarelto 15 mg 1 tab by mouth daily  Chronic diastolic CHF - no SOB; continue Lasix 40 mg 1 tab by mouth twice a day  Hypokalemia - continue Klor-Con 10 meq 1 tab by mouth twice a day Lab Results  Component Value Date   K 4.0 06/21/2016   Glaucoma - no complaints of eye pain; continue carteolol and latanoprost eyedrops  Hyponatremia - rechecked Na 140; resolved  Anemia - improved Lab Results  Component Value Date   HGB 12.1 06/19/2016   Constipation - continue MiraLAX 17 g by mouth daily and senna S 8.6-50 mg 2 tabs by mouth twice a day   UTI - continue Ceftriaxone for a total of 5 days and Florastor X 8 days  Hypertension - well-controlled; continue Toprol XL 25 mg 1 tab PO Q D      Goals of care:  Short-term rehabilitation   Kenard GowerMonina Medina-Vargas - NP [facility] [phone]

## 2016-08-03 LAB — BASIC METABOLIC PANEL
BUN: 8 mg/dL (ref 4–21)
Creatinine: 0.8 mg/dL (ref 0.5–1.1)
Glucose: 108 mg/dL
Potassium: 4.1 mmol/L (ref 3.4–5.3)
Sodium: 135 mmol/L — AB (ref 137–147)

## 2016-08-03 LAB — CBC AND DIFFERENTIAL
HCT: 36 % (ref 36–46)
Hemoglobin: 12.2 g/dL (ref 12.0–16.0)
Neutrophils Absolute: 4269 /uL
Platelets: 175 10*3/uL (ref 150–399)
WBC: 5.8 10^3/mL

## 2016-08-10 ENCOUNTER — Ambulatory Visit: Payer: Medicare Other | Admitting: Cardiology

## 2016-08-14 ENCOUNTER — Encounter: Payer: Self-pay | Admitting: Adult Health

## 2016-08-14 ENCOUNTER — Non-Acute Institutional Stay (SKILLED_NURSING_FACILITY): Payer: Medicare Other | Admitting: Adult Health

## 2016-08-14 DIAGNOSIS — I10 Essential (primary) hypertension: Secondary | ICD-10-CM

## 2016-08-14 DIAGNOSIS — S32001S Stable burst fracture of unspecified lumbar vertebra, sequela: Secondary | ICD-10-CM | POA: Diagnosis not present

## 2016-08-14 DIAGNOSIS — I480 Paroxysmal atrial fibrillation: Secondary | ICD-10-CM | POA: Diagnosis not present

## 2016-08-14 DIAGNOSIS — K5901 Slow transit constipation: Secondary | ICD-10-CM | POA: Diagnosis not present

## 2016-08-14 DIAGNOSIS — I5032 Chronic diastolic (congestive) heart failure: Secondary | ICD-10-CM | POA: Diagnosis not present

## 2016-08-14 DIAGNOSIS — E876 Hypokalemia: Secondary | ICD-10-CM | POA: Diagnosis not present

## 2016-08-14 DIAGNOSIS — H409 Unspecified glaucoma: Secondary | ICD-10-CM | POA: Diagnosis not present

## 2016-08-14 NOTE — Progress Notes (Signed)
DATE:  08/14/2016   MRN:  [id number]  BIRTHDAY: 07/07/1924  Facility:  Nursing Home Location:  [facility]  Nursing Home Room Number: 702-P  LEVEL OF CARE:  SNF (31)  Contact Information    Name Relation Home Work Mobile   Yeager,Carolyn Relative [phone]     Riggan,Billy Spouse [phone]     Kerr,Richard Relative [phone]     Lundon, Rosier [phone]         Code Status History    Date Active Date Inactive Code Status Order ID Comments User Context   06/09/2016 11:27 PM 06/14/2016  5:35 PM Full Code [id number]  Hillary Bow, DO ED   03/27/2016 11:47 AM 03/30/2016  5:31 PM Full Code [id number]  Coralie Keens, MD Inpatient   06/01/2015  2:34 PM 06/10/2015  9:36 PM Full Code [id number]  [name], MD ED       Chief Complaint  Patient presents with  . Medical Management of Chronic Issues    HISTORY OF PRESENT ILLNESS:  This is a 83 year old female who is being seen for a routine visit. She is currently having a short-term stay @ [facility]. She was recently discharged from OT.  She has been admitted to [facility] on 06/14/16 from [facility] hospitalization 06/09/16 to 06/13/16. She has L1 burst fracture with stenosis. Neurosurgery was consulted and recommended TLSO. No surgery was recommended.   PAST MEDICAL HISTORY:  Past Medical History:  Diagnosis Date  . Atrial fibrillation (HCC)   . Chronic diastolic CHF (congestive heart failure) (HCC)   . Glaucoma   . Hypertension   . Hyponatremia   . Iron deficiency anemia   . Mitral insufficiency   . TIA (transient ischemic attack) 1997  . Tricuspid insufficiency      CURRENT MEDICATIONS: Reviewed  Patient's Medications  New Prescriptions   No medications on file  Previous Medications   ACETAMINOPHEN (TYLENOL) 325 MG TABLET    Take 650 mg by mouth at bedtime as needed for moderate pain.    AMIODARONE (PACERONE) 200 MG TABLET    Take 1 tablet (200 mg total) by  mouth daily.   CALCIUM CARBONATE (OS-CAL - DOSED IN MG OF ELEMENTAL CALCIUM) 1250 (500 CA) MG TABLET    Take 1 tablet by mouth every evening.    CARTEOLOL (OCUPRESS) 1 % OPHTHALMIC SOLUTION    Place 1 drop into both eyes 2 (two) times daily.   FUROSEMIDE (LASIX) 40 MG TABLET    Take 1 tablet (40 mg total) by mouth 2 (two) times daily.   GUAIFENESIN (ROBITUSSIN) 100 MG/5ML LIQUID    Take 200 mg by mouth every 4 (four) hours as needed for cough.   HYDROCODONE-ACETAMINOPHEN (NORCO) 10-325 MG TABLET    Take 1 tablet by mouth every 8 (eight) hours as needed for moderate pain or severe pain.   KLOR-CON M10 10 MEQ TABLET    TAKE 1 TABLET (10 MEQ TOTAL) BY MOUTH 2 (TWO) TIMES DAILY.   LATANOPROST (XALATAN) 0.005 % OPHTHALMIC SOLUTION    Place 1 drop into both eyes at bedtime.   MENTHOL (ICY HOT) 5 % PTCH    Apply 1 patch topically every morning. Apply to left knee at 8AM and removed qhs   METHOCARBAMOL (ROBAXIN) 500 MG TABLET    Take 1 tablet (500 mg total) by mouth every 6 (six) hours as needed for muscle spasms.   METOPROLOL SUCCINATE (TOPROL-XL) 25 MG 24 HR TABLET  TAKE 1 TABLET (25 MG TOTAL) BY MOUTH DAILY.   MULTIPLE VITAMIN (MULTIVITAMIN WITH MINERALS) TABS TABLET    Take 1 tablet by mouth every evening.    POLYETHYLENE GLYCOL (MIRALAX / GLYCOLAX) PACKET    Take 17 g by mouth daily.    RIVAROXABAN (XARELTO) 15 MG TABS TABLET    Take 1 tablet (15 mg total) by mouth daily with supper.   SENNOSIDES-DOCUSATE SODIUM (SENOKOT-S) 8.6-50 MG TABLET    Take 2 tablets by mouth 2 (two) times daily.   UNABLE TO FIND    Med Name: Med Pass 120 mL by mouth 2 times daily   VITAMIN C (ASCORBIC ACID) 500 MG TABLET    Take 500 mg by mouth daily with lunch.   Modified Medications   No medications on file  Discontinued Medications   SACCHAROMYCES BOULARDII (FLORASTOR) 250 MG CAPSULE    Take 1 capsule (250 mg total) by mouth 2 (two) times daily.     Allergies  Allergen Reactions  . Lisinopril Cough     REVIEW  OF SYSTEMS:  GENERAL: no change in appetite, no fatigue, no weight changes, no fever, chills or weakness EYES: Denies change in vision, dry eyes, eye pain, itching or discharge EARS: Denies change in hearing, ringing in ears, or earache NOSE: Denies nasal congestion or epistaxis MOUTH and THROAT: Denies oral discomfort, gingival pain or bleeding, pain from teeth or hoarseness   RESPIRATORY: no cough, SOB, DOE, wheezing, hemoptysis CARDIAC: no chest pain, edema or palpitations GI: no abdominal pain, diarrhea, heart burn, nausea or vomiting, +constipation GU: Denies dysuria, frequency, hematuria, incontinence, or discharge PSYCHIATRIC: Denies feeling of depression or anxiety. No report of hallucinations, insomnia, paranoia, or agitation    PHYSICAL EXAMINATION  GENERAL APPEARANCE: Well nourished. In no acute distress. Normal body habitus SKIN:  Skin is warm and dry. There are no suspicious lesions or rash HEAD: Normal in size and contour. No evidence of trauma EYES: Lids open and close normally. No blepharitis, entropion or ectropion. PERRL. Conjunctivae are clear and sclerae are white. Lenses are without opacity EARS: Pinnae are normal. Patient hears normal voice tunes of the examiner MOUTH and THROAT: Lips are without lesions. Oral mucosa is moist and without lesions. Tongue is normal in shape, size, and color and without lesions NECK: supple, trachea midline, no neck masses, no thyroid tenderness, no thyromegaly LYMPHATICS: no LAN in the neck, no supraclavicular LAN RESPIRATORY: breathing is even & unlabored, BS CTAB CARDIAC: RRR, no murmur,no extra heart sounds, no edema GI: abdomen soft, normal BS, no masses, no tenderness, no hepatomegaly, no splenomegaly EXTREMITIES:  Able to move 4 extremities; has TLSO brace PSYCHIATRIC: Alert and oriented X 3. Affect and behavior are appropriate  LABS/RADIOLOGY: Labs reviewed: Basic Metabolic Panel:  Recent Labs  16/10/96 0458  03/30/16 0518 06/09/16 2031 06/12/16 0501 06/19/16 06/21/16 08/03/16  NA 133* 133* 135 132* 139 140 135*  K 4.1 4.0 3.8 4.1 3.8 4.0 4.1  CL 99* 98* 98* 98*  --   --   --   CO2 27 27  --  28  --   --   --   GLUCOSE 93 90 131* 97  --   --   --   BUN 18 13 23* 17 12 14 8   CREATININE 0.89 0.74 1.00 0.95 0.8 0.9 0.8  CALCIUM 8.8* 8.7*  --  8.6*  --   --   --    Liver Function Tests:  Recent Labs  03/22/16 1009  03/27/16 0851 03/28/16 0524  AST 21 29 31   ALT 20 26 29   ALKPHOS 70 89 75  BILITOT 0.4 0.6 0.5  PROT 6.1 7.3 7.0  ALBUMIN 4.1 4.6 4.3   CBC:  Recent Labs  03/27/16 0851  03/29/16 0458 03/30/16 0518  06/12/16 0501 06/19/16 08/03/16  WBC 11.1*  < > 10.5 8.2  --  7.7 6.8 5.8  NEUTROABS 9.9*  --   --   --   --   --  5 4,269  HGB 12.6  < > 10.5* 10.9*  < > 11.0* 12.1 12.2  HCT 36.4  < > 30.0* 31.2*  < > 32.3* 36 36  MCV 91.0  < > 89.3 91.8  --  93.4  --   --   PLT 213  < > 183 195  --  180 240 175  < > = values in this interval not displayed.     ASSESSMENT/PLAN:  L1 burst fracture with stenosis - continue TLSO; follow-up with neurosurgery ; continue acetaminophen 325 mg 2 tabs = 650 mg by mouth daily at bedtime when necessary and Norco 10/325 mg 2 tabs by mouth every 8 hours when necessary for pain; Robaxin 500 mg 1 tab by mouth every 6 hours when necessary for muscle spasm  Atrial fibrillation - rate controlled; continue amiodarone 200 mg 1 tab by mouth daily, Toprol XL 25 mg 1 tab PO Q D and Xarelto 15 mg 1 tab by mouth daily  Chronic diastolic CHF - no SOB; continue Lasix 40 mg 1 tab by mouth twice a day  Hypokalemia - continue Klor-Con 10 meq 1 tab by mouth twice a day Lab Results  Component Value Date   K 4.1 08/03/2016   Glaucoma - no complaints of eye pain; continue carteolol and latanoprost eyedrops  Constipation - continue MiraLAX 17 g by mouth daily and senna S 8.6-50 mg 2 tabs by mouth twice a day   Hypertension - well-controlled; continue Toprol  XL 25 mg 1 tab PO Q D      Goals of care:  Short-term rehabilitation   Kenard GowerMonina Medina-Vargas - NP [facility] [phone]

## 2016-08-20 ENCOUNTER — Non-Acute Institutional Stay (SKILLED_NURSING_FACILITY): Payer: Medicare Other | Admitting: Adult Health

## 2016-08-20 ENCOUNTER — Encounter: Payer: Self-pay | Admitting: Adult Health

## 2016-08-20 DIAGNOSIS — I5032 Chronic diastolic (congestive) heart failure: Secondary | ICD-10-CM | POA: Diagnosis not present

## 2016-08-20 DIAGNOSIS — E876 Hypokalemia: Secondary | ICD-10-CM | POA: Diagnosis not present

## 2016-08-20 DIAGNOSIS — S32001S Stable burst fracture of unspecified lumbar vertebra, sequela: Secondary | ICD-10-CM

## 2016-08-20 DIAGNOSIS — R2681 Unsteadiness on feet: Secondary | ICD-10-CM | POA: Diagnosis not present

## 2016-08-20 DIAGNOSIS — I10 Essential (primary) hypertension: Secondary | ICD-10-CM

## 2016-08-20 DIAGNOSIS — I480 Paroxysmal atrial fibrillation: Secondary | ICD-10-CM

## 2016-08-20 DIAGNOSIS — K5901 Slow transit constipation: Secondary | ICD-10-CM

## 2016-08-20 DIAGNOSIS — H409 Unspecified glaucoma: Secondary | ICD-10-CM | POA: Diagnosis not present

## 2016-08-20 NOTE — Progress Notes (Signed)
DATE:  08/20/2016   MRN:  [id number]  BIRTHDAY: 1924/05/27  Facility:  Nursing Home Location:  [facility]  Nursing Home Room Number: 702-P  LEVEL OF CARE:  SNF (31)  Contact Information    Name Relation Home Work Mobile   Mesa,Carolyn Relative [phone]     Bertucci,Billy Spouse [phone]     Kerr,Richard Relative [phone]     Shalisa, Mcquade [phone]         Code Status History    Date Active Date Inactive Code Status Order ID Comments User Context   06/09/2016 11:27 PM 06/14/2016  5:35 PM Full Code [id number]  Hillary Bow, DO ED   03/27/2016 11:47 AM 03/30/2016  5:31 PM Full Code [id number]  Coralie Keens, MD Inpatient   06/01/2015  2:34 PM 06/10/2015  9:36 PM Full Code [id number]  [name], MD ED       Chief Complaint  Patient presents with  . Discharge Note    HISTORY OF PRESENT ILLNESS:  This is a 89-YO female who is for discharge home with Home health PT, OT and CNA.  has been admitted to [facility] on 06/14/16 from [facility] hospitalization 06/09/16 to 06/13/16. She has L1 burst fracture with stenosis. Neurosurgery was consulted and recommended TLSO. No surgery needed at this time and will follow-up as an outpatient.  Patient was admitted to this facility for short-term rehabilitation after the patient's recent hospitalization.  Patient has completed SNF rehabilitation and therapy has cleared the patient for discharge.      PAST MEDICAL HISTORY:  Past Medical History:  Diagnosis Date  . Atrial fibrillation (HCC)   . Chronic diastolic CHF (congestive heart failure) (HCC)   . Glaucoma   . Hypertension   . Hyponatremia   . Iron deficiency anemia   . Mitral insufficiency   . TIA (transient ischemic attack) 1997  . Tricuspid insufficiency      CURRENT MEDICATIONS: Reviewed  Patient's Medications  New Prescriptions   No medications on file  Previous Medications   ACETAMINOPHEN (TYLENOL) 325 MG  TABLET    Take 650 mg by mouth at bedtime as needed for moderate pain.    AMIODARONE (PACERONE) 200 MG TABLET    Take 1 tablet (200 mg total) by mouth daily.   CALCIUM CARBONATE (OS-CAL - DOSED IN MG OF ELEMENTAL CALCIUM) 1250 (500 CA) MG TABLET    Take 1 tablet by mouth every evening.    CARTEOLOL (OCUPRESS) 1 % OPHTHALMIC SOLUTION    Place 1 drop into both eyes 2 (two) times daily.   FUROSEMIDE (LASIX) 40 MG TABLET    Take 1 tablet (40 mg total) by mouth 2 (two) times daily.   GUAIFENESIN (ROBITUSSIN) 100 MG/5ML LIQUID    Take 200 mg by mouth 3 (three) times daily as needed for cough. 6AM, 2PM, 10PM x1 week   HYDROCODONE-ACETAMINOPHEN (NORCO) 10-325 MG TABLET    Take 1 tablet by mouth every 8 (eight) hours as needed for moderate pain or severe pain.   KLOR-CON M10 10 MEQ TABLET    TAKE 1 TABLET (10 MEQ TOTAL) BY MOUTH 2 (TWO) TIMES DAILY.   LATANOPROST (XALATAN) 0.005 % OPHTHALMIC SOLUTION    Place 1 drop into both eyes at bedtime.   MENTHOL (ICY HOT) 5 % PTCH    Apply 1 patch topically every morning. Apply to left knee at 8AM and removed qhs   METHOCARBAMOL (ROBAXIN) 500 MG TABLET  Take 1 tablet (500 mg total) by mouth every 6 (six) hours as needed for muscle spasms.   METOPROLOL SUCCINATE (TOPROL-XL) 25 MG 24 HR TABLET    TAKE 1 TABLET (25 MG TOTAL) BY MOUTH DAILY.   MULTIPLE VITAMIN (MULTIVITAMIN WITH MINERALS) TABS TABLET    Take 1 tablet by mouth every evening.    POLYETHYLENE GLYCOL (MIRALAX / GLYCOLAX) PACKET    Take 17 g by mouth daily.    RIVAROXABAN (XARELTO) 15 MG TABS TABLET    Take 1 tablet (15 mg total) by mouth daily with supper.   SENNOSIDES-DOCUSATE SODIUM (SENOKOT-S) 8.6-50 MG TABLET    Take 2 tablets by mouth 2 (two) times daily.   UNABLE TO FIND    Med Name: Med Pass 120 mL by mouth 2 times daily   VITAMIN C (ASCORBIC ACID) 500 MG TABLET    Take 500 mg by mouth daily with lunch.   Modified Medications   No medications on file  Discontinued Medications   No medications on  file     Allergies  Allergen Reactions  . Lisinopril Cough     REVIEW OF SYSTEMS:  GENERAL: no change in appetite, no fatigue, no weight changes, no fever, chills or weakness EYES: Denies change in vision, dry eyes, eye pain, itching or discharge EARS: Denies change in hearing, ringing in ears, or earache NOSE: Denies nasal congestion or epistaxis MOUTH and THROAT: Denies oral discomfort, gingival pain or bleeding, pain from teeth or hoarseness   RESPIRATORY: no cough, SOB, DOE, wheezing, hemoptysis CARDIAC: no chest pain, edema or palpitations GI: no abdominal pain, diarrhea, constipation, heart burn, nausea or vomiting GU: Denies dysuria, frequency, hematuria, incontinence, or discharge PSYCHIATRIC: Denies feeling of depression or anxiety. No report of hallucinations, insomnia, paranoia, or agitation    PHYSICAL EXAMINATION  GENERAL APPEARANCE: Well nourished. In no acute distress. Normal body habitus SKIN:  Skin is warm and dry.  HEAD: Normal in size and contour. No evidence of trauma EYES: Lids open and close normally. No blepharitis, entropion or ectropion. PERRL. Conjunctivae are clear and sclerae are white. Lenses are without opacity EARS: Pinnae are normal. Patient hears normal voice tunes of the examiner MOUTH and THROAT: Lips are without lesions. Oral mucosa is moist and without lesions. Tongue is normal in shape, size, and color and without lesions NECK: supple, trachea midline, no neck masses, no thyroid tenderness, no thyromegaly LYMPHATICS: no LAN in the neck, no supraclavicular LAN RESPIRATORY: breathing is even & unlabored, BS CTAB CARDIAC: RRR, no murmur,no extra heart sounds, no edema GI: abdomen soft, normal BS, no masses, no tenderness, no hepatomegaly, no splenomegaly EXTREMITIES:  Able to move X 4 extremities, has TLSO brace PSYCHIATRIC: Alert and oriented X 3. Affect and behavior are appropriate   LABS/RADIOLOGY: Labs reviewed: Basic Metabolic  Panel:  Recent Labs  03/29/16 0458 03/30/16 0518 06/09/16 2031 06/12/16 0501 06/19/16 06/21/16 08/03/16  NA 133* 133* 135 132* 139 140 135*  K 4.1 4.0 3.8 4.1 3.8 4.0 4.1  CL 99* 98* 98* 98*  --   --   --   CO2 27 27  --  28  --   --   --   GLUCOSE 93 90 131* 97  --   --   --   BUN 18 13 23* 17 12 14 8   CREATININE 0.89 0.74 1.00 0.95 0.8 0.9 0.8  CALCIUM 8.8* 8.7*  --  8.6*  --   --   --  Liver Function Tests:  Recent Labs  03/22/16 1009 03/27/16 0851 03/28/16 0524  AST 21 29 31   ALT 20 26 29   ALKPHOS 70 89 75  BILITOT 0.4 0.6 0.5  PROT 6.1 7.3 7.0  ALBUMIN 4.1 4.6 4.3   CBC:  Recent Labs  03/27/16 0851  03/29/16 0458 03/30/16 0518  06/12/16 0501 06/19/16 08/03/16  WBC 11.1*  < > 10.5 8.2  --  7.7 6.8 5.8  NEUTROABS 9.9*  --   --   --   --   --  5 4,269  HGB 12.6  < > 10.5* 10.9*  < > 11.0* 12.1 12.2  HCT 36.4  < > 30.0* 31.2*  < > 32.3* 36 36  MCV 91.0  < > 89.3 91.8  --  93.4  --   --   PLT 213  < > 183 195  --  180 240 175  < > = values in this interval not displayed.   ASSESSMENT/PLAN:  Unsteady gait - for home health PT and OT, for therapeutic strengthening exercises; fall precautions  L1 burst fracture with stenosis - continue TLSO; follow-up with neurosurgery ; continue acetaminophen 325 mg 2 tabs = 650 mg by mouth daily at bedtime when necessary and Norco 10/325 mg 1 tab by mouth every 8 hours when necessary for pain; Robaxin 500 mg 1 tab by mouth every 6 hours when necessary for muscle spasm  Atrial fibrillation - rate controlled; continue amiodarone 200 mg 1 tab by mouth daily, Toprol XL 25 mg 1 tab PO Q D and Xarelto 15 mg 1 tab by mouth daily  Chronic diastolic CHF - no SOB; continue Lasix 40 mg 1 tab by mouth twice a day  Hypokalemia - continue Klor-Con 10 meq 1 tab by mouth twice a day  Glaucoma - no complaints of eye pain; continue carteolol and latanoprost eyedrops  Slow transit constipation - continue MiraLAX 17 g by mouth daily and  senna S 8.6-50 mg 2 tabs by mouth twice a day   Essential hypertension - well-controlled; continue Toprol XL 25 mg 1 tab PO Q D      I have filled out patient's discharge paperwork and written prescriptions.  Patient will receive home health PT, OT and CNA.  DME provided:  None  Total discharge time: Less than 30 minutes  Discharge time involved coordination of the discharge process with social worker, nursing staff and therapy department. Medical justification for home health services verified.     Jamyrah Saur C. Medina-Vargas - NP    [facility] [phone]

## 2016-08-31 ENCOUNTER — Ambulatory Visit: Payer: Medicare Other | Admitting: Cardiology

## 2016-09-10 ENCOUNTER — Encounter: Payer: Self-pay | Admitting: Cardiology

## 2016-09-18 NOTE — Progress Notes (Signed)
Cardiology Office Note   Date:  09/19/2016   ID:  [name], Jones 08-Jan-1924, MRN [id number]  PCP:  Lorenda Peck, MD  Cardiologist:   Makiya Jeune [place], MD   Chief Complaint  Patient presents with  . Follow-up  . Atrial Fibrillation  . Congestive Heart Failure      History of Present Illness: [name] Jones is a 83 y.o. female who presents for follow up CHF and atrial fibrillation.  She has a history of HTN. In early November 2016 she was noted to have new onset atrial fibrillation. She was started on Xarelto. She deteriorated and was admitted with acute diastolic CHF. She was diuresed with IV lasix. She had hyponatremia. She had an Echo in May 2016 while in NSR showing Normal LV function with grade 2 diastolic dysfunction. There was moderate MR and mild to moderate TR. Mild pulmonary HTN. Repeat Echo in hospital showed normal LV function with severe MR and TR and mod to severe biatrial enlargement.  She later underwent DCCV on 09/15/15. On follow up she was noted to be bradycardic with HR into low 40s. Her metoprolol and cardizem doses were reduced.    She was admitted in early September with CAP LLL. Was in NSR at that time. Xarelto dose was reduced.   In November she was admitted after a fall and suffered an L1 burst fracture. She was treated conservatively and DC to a SNF for Rehab. She was at [facility] until recently.   On follow up today she is doing OK. No chest pain or SOB.  No palpitations. No bleeding. No edema. Weight has been stable. She is under a lot of stress with her husband at home in declining health at age 44.     Past Medical History:  Diagnosis Date  . Atrial fibrillation (HCC)   . Chronic diastolic CHF (congestive heart failure) (HCC)   . Glaucoma   . Hypertension   . Hyponatremia   . Iron deficiency anemia   . Mitral insufficiency   . TIA (transient ischemic attack) 1997  . Tricuspid insufficiency     Past Surgical History:    Procedure Laterality Date  . ABDOMINAL HYSTERECTOMY    . CARDIOVERSION N/A 09/15/2015   Procedure: CARDIOVERSION;  Surgeon: Wendall Stade, MD;  Location: [facility] ENDOSCOPY;  Service: Cardiovascular;  Laterality: N/A;  . CATARACT EXTRACTION    . DIAGNOSTIC LAPAROSCOPY    . STRABISMUS SURGERY    . TONSILLECTOMY       Current Outpatient Prescriptions  Medication Sig Dispense Refill  . acetaminophen (TYLENOL) 325 MG tablet Take 650 mg by mouth at bedtime as needed for moderate pain.     Marland Kitchen amiodarone (PACERONE) 200 MG tablet Take 1 tablet (200 mg total) by mouth daily. 90 tablet 3  . calcium carbonate (OS-CAL - DOSED IN MG OF ELEMENTAL CALCIUM) 1250 (500 CA) MG tablet Take 1 tablet by mouth every evening.     . carteolol (OCUPRESS) 1 % ophthalmic solution Place 1 drop into both eyes 2 (two) times daily.    . furosemide (LASIX) 40 MG tablet Take 1 tablet (40 mg total) by mouth 2 (two) times daily. 180 tablet 3  . HYDROcodone-acetaminophen (NORCO) 10-325 MG tablet Take 1 tablet by mouth every 8 (eight) hours as needed for moderate pain or severe pain.    Marland Kitchen KLOR-CON M10 10 MEQ tablet TAKE 1 TABLET (10 MEQ TOTAL) BY MOUTH 2 (TWO) TIMES DAILY. 60 tablet 11  .  latanoprost (XALATAN) 0.005 % ophthalmic solution Place 1 drop into both eyes at bedtime.    . Menthol (ICY HOT) 5 % PTCH Apply 1 patch topically every morning. Apply to left knee at 8AM and removed qhs    . metoprolol succinate (TOPROL-XL) 25 MG 24 hr tablet TAKE 1 TABLET (25 MG TOTAL) BY MOUTH DAILY. 30 tablet 4  . Multiple Vitamin (MULTIVITAMIN WITH MINERALS) TABS tablet Take 1 tablet by mouth every evening.     . Rivaroxaban (XARELTO) 15 MG TABS tablet Take 1 tablet (15 mg total) by mouth daily with supper. 35 tablet 0  . sertraline (ZOLOFT) 25 MG tablet Take 12.5 tablets by mouth as directed.    Marland Kitchen. UNABLE TO FIND Med Name: Med Pass 120 mL by mouth 2 times daily    . vitamin C (ASCORBIC ACID) 500 MG tablet Take 500 mg by mouth daily with lunch.       No current facility-administered medications for this visit.     Allergies:   Lisinopril    Social History:  The patient  reports that she has never smoked. She has never used smokeless tobacco. She reports that she drinks alcohol. She reports that she does not use drugs.   Family History:  The patient's family history includes Heart attack in her brother; Heart attack (age of onset: 2952) in her son; Heart attack (age of onset: 4076) in her father; Heart disease in her mother; Hypertension in her son; Ovarian cancer (age of onset: 6452) in her mother.    ROS:  Please see the history of present illness.   Otherwise, review of systems are positive for none.   All other systems are reviewed and negative.    PHYSICAL EXAM: VS:  BP (!) 164/68   Pulse 66   Ht 5' (1.524 m)   Wt 120 lb (54.4 kg)   SpO2 99%   BMI 23.44 kg/m  , BMI Body mass index is 23.44 kg/m. GEN: Well nourished, elderly, in no acute distress  HEENT: normal  Neck: no JVD, carotid bruits, or masses Cardiac: RRR; normal S1-2. Gr 2/6 systolic murmur at the apex. No edema. Respiratory:  clear to auscultation bilaterally, normal work of breathing GI: soft, nontender, nondistended, + BS MS: no deformity or atrophy  Skin: warm and dry, no rash Neuro:  Strength and sensation are intact walks with a walker. Psych: euthymic mood, full affect   EKG:  EKG is not ordered today.    Recent Labs: 03/22/2016: TSH 2.55 03/27/2016: B Natriuretic Peptide 181.2 03/28/2016: ALT 29 08/03/2016: BUN 8; Creatinine 0.8; Hemoglobin 12.2; Platelets 175; Potassium 4.1; Sodium 135    Lipid Panel    Component Value Date/Time   CHOL 161 06/08/2015 2322   TRIG 76 06/08/2015 2322   HDL 53 06/08/2015 2322   CHOLHDL 3.0 06/08/2015 2322   VLDL 15 06/08/2015 2322   LDLCALC 93 06/08/2015 2322      Wt Readings from Last 3 Encounters:  09/19/16 120 lb (54.4 kg)  08/20/16 119 lb 11.2 oz (54.3 kg)  08/14/16 119 lb 11.4 oz (54.3 kg)       Other studies Reviewed: Additional studies/ records that were reviewed today include:    ASSESSMENT AND PLAN:  1.  Atrial fibrillation s/p DCCV in February 2017. Maintaining NSR on amiodarone. Will continue amiodarone. Continue metoprolol and Xarelto.   2. Chronic diastolic CHF. Exacerbated by Afib. On appropriate diuretic therapy and appears euvolemic.   3. Hyponatremia. Corrected.   4.  Moderate to severe MR and TR.   Will treat medically. She is not a candidate for valve surgery.   5. Iron deficiency anemia.  6. HTN- well controlled.  7. L1 burst fracture recovering.   Current medicines are reviewed at length with the patient today.  The patient does not have concerns regarding medicines.  The following changes have been made:  no change  Labs/ tests ordered today include:   No orders of the defined types were placed in this encounter.   Disposition:   FU with 6 months. Will need CMET and TSH then  Signed, Syniyah Bourne [place], MD  09/19/2016 3:09 PM    [facility] [address] [place], [place], [address] Phone [phone], Fax [phone]

## 2016-09-19 ENCOUNTER — Ambulatory Visit (INDEPENDENT_AMBULATORY_CARE_PROVIDER_SITE_OTHER): Payer: Medicare Other | Admitting: Cardiology

## 2016-09-19 ENCOUNTER — Encounter: Payer: Self-pay | Admitting: Cardiology

## 2016-09-19 VITALS — BP 164/68 | HR 66 | Ht 60.0 in | Wt 120.0 lb

## 2016-09-19 DIAGNOSIS — I5032 Chronic diastolic (congestive) heart failure: Secondary | ICD-10-CM | POA: Diagnosis not present

## 2016-09-19 DIAGNOSIS — I480 Paroxysmal atrial fibrillation: Secondary | ICD-10-CM

## 2016-09-19 DIAGNOSIS — Z7901 Long term (current) use of anticoagulants: Secondary | ICD-10-CM | POA: Diagnosis not present

## 2016-09-19 NOTE — Patient Instructions (Addendum)
Continue your current therapy  I will see you in 6 months.   

## 2016-11-09 ENCOUNTER — Other Ambulatory Visit: Payer: Self-pay | Admitting: Physician Assistant

## 2016-11-09 ENCOUNTER — Other Ambulatory Visit: Payer: Self-pay | Admitting: Cardiology

## 2016-11-09 DIAGNOSIS — I5032 Chronic diastolic (congestive) heart failure: Secondary | ICD-10-CM

## 2016-11-09 DIAGNOSIS — I480 Paroxysmal atrial fibrillation: Secondary | ICD-10-CM

## 2016-11-17 ENCOUNTER — Other Ambulatory Visit: Payer: Self-pay | Admitting: Cardiology

## 2016-11-17 DIAGNOSIS — I480 Paroxysmal atrial fibrillation: Secondary | ICD-10-CM

## 2016-11-17 DIAGNOSIS — I5032 Chronic diastolic (congestive) heart failure: Secondary | ICD-10-CM

## 2016-11-19 NOTE — Telephone Encounter (Signed)
REFILL 

## 2016-12-25 ENCOUNTER — Telehealth: Payer: Self-pay | Admitting: Cardiology

## 2016-12-25 MED ORDER — RIVAROXABAN 15 MG PO TABS: 15.0000 mg | ORAL_TABLET | Freq: Every day | ORAL | 0 refills | Status: DC

## 2016-12-25 NOTE — Telephone Encounter (Signed)
New message ° ° ° ° ° °Patient calling the office for samples of medication: ° ° °1.  What medication and dosage are you requesting samples for?  xarelto 15mg ° °2.  Are you currently out of this medication?  Almost out ° ° °

## 2016-12-25 NOTE — Telephone Encounter (Signed)
Patient aware samples are at the front desk for pick up  

## 2017-01-01 ENCOUNTER — Other Ambulatory Visit: Payer: Self-pay | Admitting: Adult Health

## 2017-01-04 ENCOUNTER — Other Ambulatory Visit: Payer: Self-pay | Admitting: Adult Health

## 2017-01-07 ENCOUNTER — Ambulatory Visit
Admission: RE | Admit: 2017-01-07 | Discharge: 2017-01-07 | Disposition: A | Discharge status: Home / Self Care | Payer: Medicare Other | Source: Ambulatory Visit | Attending: Internal Medicine | Admitting: Internal Medicine

## 2017-01-07 ENCOUNTER — Other Ambulatory Visit: Payer: Self-pay | Admitting: Internal Medicine

## 2017-01-07 DIAGNOSIS — I509 Heart failure, unspecified: Secondary | ICD-10-CM

## 2017-01-28 ENCOUNTER — Telehealth: Payer: Self-pay | Admitting: Cardiology

## 2017-01-28 NOTE — Telephone Encounter (Signed)
Patient made aware that samples are available at the front for her.  Samples of this drug were given to the patient, quantity            3 bottles, Lot Number [id number].

## 2017-01-28 NOTE — Telephone Encounter (Signed)
New Message  Patient calling the office for samples of medication:   1.  What medication and dosage are you requesting samples for? xarelto 1.5 mg   2.  Are you currently out of this medication? Yes     

## 2017-03-01 ENCOUNTER — Telehealth: Payer: Self-pay | Admitting: Cardiology

## 2017-03-01 NOTE — Telephone Encounter (Signed)
Contacted Pt, Verbalized that we do have (XARELTO 20mg ) SAMPLES. Pt states she will be in this afternoon.

## 2017-03-01 NOTE — Telephone Encounter (Signed)
New message   Patient calling the office for samples of medication:   1.  What medication and dosage are you requesting samples for?Rivaroxaban (XARELTO) 15 MG TABS tablet 2.  Are you currently out of this medication? Pt is out and states she usually gets samples when she is out

## 2017-03-05 ENCOUNTER — Telehealth: Payer: Self-pay | Admitting: Cardiology

## 2017-03-05 MED ORDER — RIVAROXABAN 15 MG PO TABS: 15.0000 mg | ORAL_TABLET | Freq: Every day | ORAL | 0 refills | Status: DC

## 2017-03-05 NOTE — Telephone Encounter (Signed)
New Message     Patient calling the office for samples of medication:   1.  What medication and dosage are you requesting samples for? Xarelto 15mg   2.  Are you currently out of this medication?  Yes     She was given the wrong pills last week

## 2017-03-05 NOTE — Telephone Encounter (Signed)
Spoke with Patient and she stated she was given Xarelto 20mg  samples last week and she wanted 15mg . Patient informed 15mg  Xarelto samples will be placed upfront. Patient voiced understanding.

## 2017-03-08 NOTE — Telephone Encounter (Signed)
Patient came by office today to return the incorrect samples that were given to her last week. She stated she did not open the sample bag or mess with the samples in any way shape or form. Spoke with Raquel, Pharmacist, and we both checked the samples and the seal was not broken. Raquel, pharmacist stated she would record the sample in her book and use them.

## 2017-03-16 NOTE — Progress Notes (Signed)
Cardiology Office Note   Date:  03/18/2017   ID:  [name], Test November [birth year], Jones, MRN [id number]  PCP:  Burton Apley, MD  Cardiologist:   Marquice Uddin [place], MD   Chief Complaint  Patient presents with  . Atrial Fibrillation  . Congestive Heart Failure      History of Present Illness: [name] Jones is a 83 y.o. female who presents for follow up CHF and atrial fibrillation.  She has a history of HTN. In early November 2016 she was noted to have new onset atrial fibrillation. She was started on Xarelto. She deteriorated and was admitted with acute diastolic CHF. She was diuresed with IV lasix. She had hyponatremia. She had an Echo in May 2016 while in NSR showing Normal LV function with grade 2 diastolic dysfunction. There was moderate MR and mild to moderate TR. Mild pulmonary HTN. Repeat Echo in hospital showed normal LV function with severe MR and TR and mod to severe biatrial enlargement.  She later underwent DCCV on 09/15/15. On follow up she was noted to be bradycardic with HR into low 40s. Her metoprolol and cardizem doses were reduced.    She was admitted in early September with CAP LLL. Was in NSR at that time. Xarelto dose was reduced.   In November she was admitted after a fall and suffered an L1 burst fracture. She was treated conservatively.  On follow up today she is seen with her daughter. Her husband passed away this year. She is more sedentary.  No chest pain.   No palpitations. No bleeding. No edema. She does note more SOB with activity. He has a bad back and knees.  Weight has been stable.    Past Medical History:  Diagnosis Date  . Atrial fibrillation (HCC)   . Chronic diastolic CHF (congestive heart failure) (HCC)   . Glaucoma   . Hypertension   . Hyponatremia   . Iron deficiency anemia   . Mitral insufficiency   . TIA (transient ischemic attack) 1997  . Tricuspid insufficiency     Past Surgical History:  Procedure Laterality Date  . ABDOMINAL  HYSTERECTOMY    . CARDIOVERSION N/A 09/15/2015   Procedure: CARDIOVERSION;  Surgeon: Wendall Stade, MD;  Location: [facility] ENDOSCOPY;  Service: Cardiovascular;  Laterality: N/A;  . CATARACT EXTRACTION    . DIAGNOSTIC LAPAROSCOPY    . STRABISMUS SURGERY    . TONSILLECTOMY       Current Outpatient Prescriptions  Medication Sig Dispense Refill  . acetaminophen (TYLENOL) 325 MG tablet Take 650 mg by mouth at bedtime as needed for moderate pain.     Marland Kitchen amiodarone (PACERONE) 200 MG tablet TAKE 1 TABLET BY MOUTH EVERY DAY 90 tablet 3  . calcium carbonate (OS-CAL - DOSED IN MG OF ELEMENTAL CALCIUM) 1250 (500 CA) MG tablet Take 1 tablet by mouth every evening.     . carteolol (OCUPRESS) 1 % ophthalmic solution Place 1 drop into both eyes 2 (two) times daily.    . furosemide (LASIX) 40 MG tablet TAKE 1 TABLET BY MOUTH TWICE A DAY 180 tablet 1  . KLOR-CON M10 10 MEQ tablet TAKE 1 TABLET (10 MEQ TOTAL) BY MOUTH 2 (TWO) TIMES DAILY. 60 tablet 11  . latanoprost (XALATAN) 0.005 % ophthalmic solution Place 1 drop into both eyes at bedtime.    . Menthol (ICY HOT) 5 % PTCH Apply 1 patch topically every morning. Apply to left knee at 8AM and removed qhs    .  metoprolol succinate (TOPROL-XL) 25 MG 24 hr tablet TAKE 1 TABLET (25 MG TOTAL) BY MOUTH DAILY. 30 tablet 4  . Multiple Vitamin (MULTIVITAMIN WITH MINERALS) TABS tablet Take 1 tablet by mouth every evening.     . Rivaroxaban (XARELTO) 15 MG TABS tablet Take 1 tablet (15 mg total) by mouth daily with supper. 21 tablet 0  . UNABLE TO FIND Med Name: Med Pass 120 mL by mouth 2 times daily    . vitamin C (ASCORBIC ACID) 500 MG tablet Take 500 mg by mouth daily with lunch.      No current facility-administered medications for this visit.     Allergies:   Lisinopril    Social History:  The patient  reports that she has never smoked. She has never used smokeless tobacco. She reports that she drinks alcohol. She reports that she does not use drugs.   Family  History:  The patient's family history includes Heart attack in her brother; Heart attack (age of onset: 87) in her son; Heart attack (age of onset: 80) in her father; Heart disease in her mother; Hypertension in her son; Ovarian cancer (age of onset: 65) in her mother.    ROS:  Please see the history of present illness.   Otherwise, review of systems are positive for none.   All other systems are reviewed and negative.    PHYSICAL EXAM: VS:  BP 140/76   Pulse 66   Ht 5\' 1"  (1.549 m)   Wt 119 lb 3.2 oz (54.1 kg)   BMI 22.52 kg/m  , BMI Body mass index is 22.52 kg/m. GEN: Well nourished, elderly, in no acute distress  HEENT: normal  Neck: no JVD, carotid bruits, or masses Cardiac: RRR; normal S1-2. Gr 2/6 systolic murmur at the apex. No edema. Respiratory:  clear to auscultation bilaterally, normal work of breathing GI: soft, nontender, nondistended, + BS MS: no deformity or atrophy  Skin: warm and dry, no rash Neuro:  Strength and sensation are intact walks with a walker. Psych: euthymic mood, full affect   EKG:  EKG is  ordered today. NSR rate 66. Normal.  I have personally reviewed and interpreted this study.    Recent Labs: 03/22/2016: TSH 2.55 03/27/2016: B Natriuretic Peptide 181.2 03/28/2016: ALT 29 08/03/2016: BUN 8; Creatinine 0.8; Hemoglobin 12.2; Platelets 175; Potassium 4.1; Sodium 135    Lipid Panel    Component Value Date/Time   CHOL 161 06/08/2015 2322   TRIG 76 06/08/2015 2322   HDL 53 06/08/2015 2322   CHOLHDL 3.0 06/08/2015 2322   VLDL 15 06/08/2015 2322   LDLCALC 93 06/08/2015 2322      Wt Readings from Last 3 Encounters:  03/18/17 119 lb 3.2 oz (54.1 kg)  09/19/16 120 lb (54.4 kg)  08/20/16 119 lb 11.2 oz (54.3 kg)      Other studies Reviewed: Additional studies/ records that were reviewed today include:    ASSESSMENT AND PLAN:  1.  Atrial fibrillation s/p DCCV in February 2017. Maintaining NSR on amiodarone. Will continue amiodarone.  Continue metoprolol and Xarelto. Recommend follow up lab work this fall with Dr. Su Hilt including LFTs and thyroid panel.   2. Chronic diastolic CHF. Exacerbated by Afib. On appropriate diuretic therapy and appears euvolemic. I suspect dyspnea is more related to loss of conditioning.  3. Hyponatremia. Corrected.   4. Moderate to severe MR and TR.   Will treat medically. She is not a candidate for valve surgery.   5. Iron deficiency  anemia.  6. HTN- well controlled.  7. L1 burst fracture.  8. Lumbar disc disease  9. Osteoarthritis of the knees. She asks about having knee replacement surgery but I don't think this is advisable at her age.   Current medicines are reviewed at length with the patient today.  The patient does not have concerns regarding medicines.  The following changes have been made:  no change  Labs/ tests ordered today include:   Orders Placed This Encounter  Procedures  . EKG 12-Lead    Disposition:   FU with 6 months. Will need CMET and TSH then  Signed, Kaysee Hergert [place], MD  03/18/2017 2:01 PM    [facility] [address] [place], [place], [address] Phone [phone], Fax [phone]

## 2017-03-18 ENCOUNTER — Other Ambulatory Visit: Payer: Self-pay

## 2017-03-18 ENCOUNTER — Ambulatory Visit (INDEPENDENT_AMBULATORY_CARE_PROVIDER_SITE_OTHER): Payer: Medicare Other | Admitting: Cardiology

## 2017-03-18 ENCOUNTER — Encounter: Payer: Self-pay | Admitting: Cardiology

## 2017-03-18 VITALS — BP 140/76 | HR 66 | Ht 61.0 in | Wt 119.2 lb

## 2017-03-18 DIAGNOSIS — I480 Paroxysmal atrial fibrillation: Secondary | ICD-10-CM | POA: Diagnosis not present

## 2017-03-18 DIAGNOSIS — I10 Essential (primary) hypertension: Secondary | ICD-10-CM

## 2017-03-18 DIAGNOSIS — I5032 Chronic diastolic (congestive) heart failure: Secondary | ICD-10-CM

## 2017-03-18 MED ORDER — METOPROLOL SUCCINATE ER 25 MG PO TB24: 25.0000 mg | ORAL_TABLET | Freq: Every day | ORAL | 3 refills | Status: DC

## 2017-03-18 NOTE — Patient Instructions (Signed)
Continue your current therapy  Follow up with Dr. Su Hilt for your lab work. This should include assessment liver and thyroid function

## 2017-04-19 ENCOUNTER — Telehealth: Payer: Self-pay | Admitting: Cardiology

## 2017-04-19 NOTE — Telephone Encounter (Signed)
Spoke with patient advised her that Xarelto  samples are waiting for her to pick up. Patient verbalized understanding.

## 2017-04-19 NOTE — Telephone Encounter (Signed)
Patient calling the office for samples of medication:   1.  What medication and dosage are you requesting samples for? xarelto   2.  Are you currently out of this medication? Week left

## 2017-04-27 ENCOUNTER — Other Ambulatory Visit: Payer: Self-pay | Admitting: Cardiology

## 2017-05-12 ENCOUNTER — Other Ambulatory Visit: Payer: Self-pay | Admitting: Cardiology

## 2017-05-12 DIAGNOSIS — I5032 Chronic diastolic (congestive) heart failure: Secondary | ICD-10-CM

## 2017-05-12 DIAGNOSIS — I480 Paroxysmal atrial fibrillation: Secondary | ICD-10-CM

## 2017-05-13 NOTE — Telephone Encounter (Signed)
REFILL 

## 2017-05-22 ENCOUNTER — Telehealth: Payer: Self-pay | Admitting: Cardiology

## 2017-05-22 NOTE — Telephone Encounter (Signed)
New message ° ° °Patient calling the office for samples of medication: ° ° °1.  What medication and dosage are you requesting samples for? Rivaroxaban (XARELTO) 15 MG TABS tablet ° °2.  Are you currently out of this medication? No  ° ° °

## 2017-05-28 ENCOUNTER — Telehealth: Payer: Self-pay | Admitting: Cardiology

## 2017-05-28 NOTE — Telephone Encounter (Signed)
New Message  Patient calling the office for samples of medication:   1.  What medication and dosage are you requesting samples for? Xarelto 15mg    2.  Are you currently out of this medication? Per pt has enough for 3 days

## 2017-05-29 IMAGING — CT CT CHEST W/O CM
2 of 5 series · 13 of 36 positions shown, 16 images · non-contrast
Comparison: Radiographs yesterday.

CLINICAL DATA: Wheezing and shortness of breath. Symptoms for 1
week. Cough.

EXAM:
CT CHEST WITHOUT CONTRAST
TECHNIQUE: Multidetector CT imaging of the chest was performed following the
standard protocol without IV contrast.

[Series 4: thorax 2.0 · axial · 0.65mm/px · z∈[-285,-9]mm · 10 of 170 slices shown, 13 images]
[im 16/170  mediastinal]
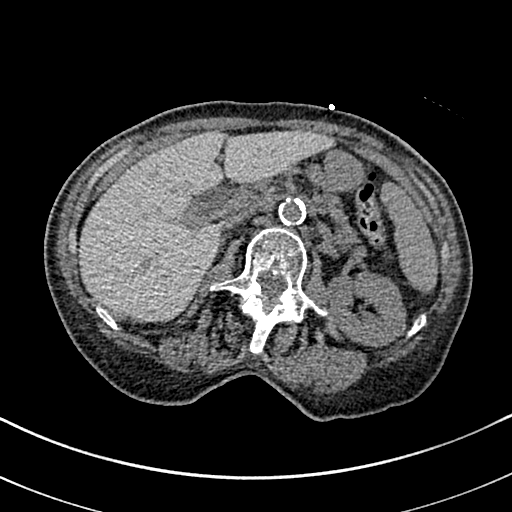
[im 16/170  lung]
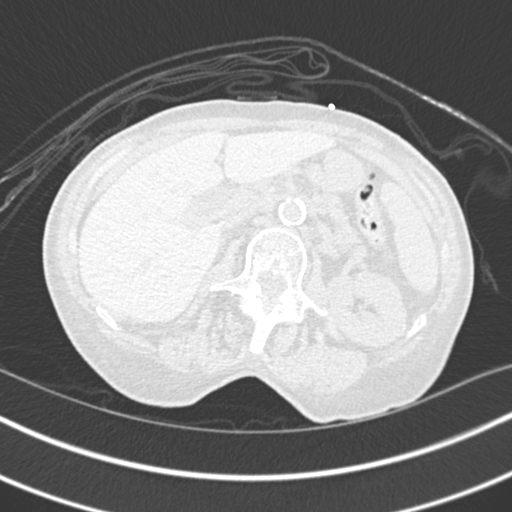
[im 31/170  lung]
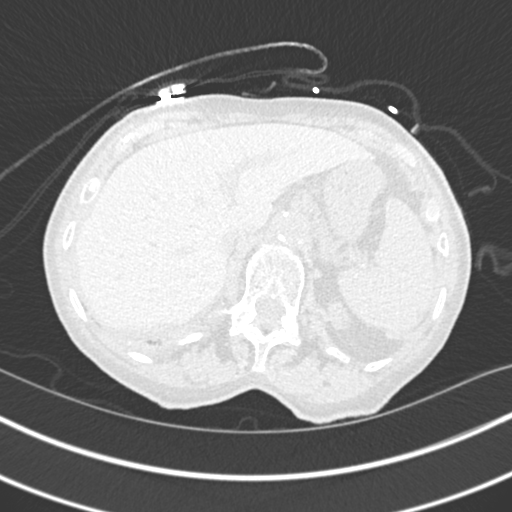
[im 47/170  lung]
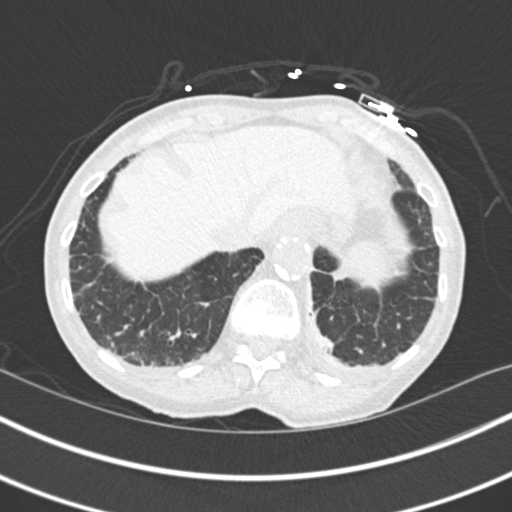
[im 62/170  lung]
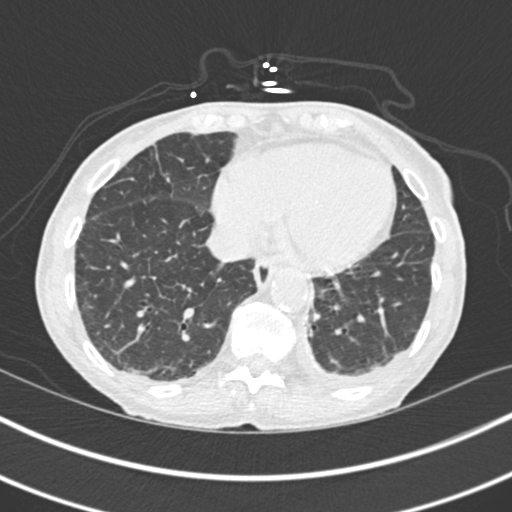
[im 77/170  mediastinal]
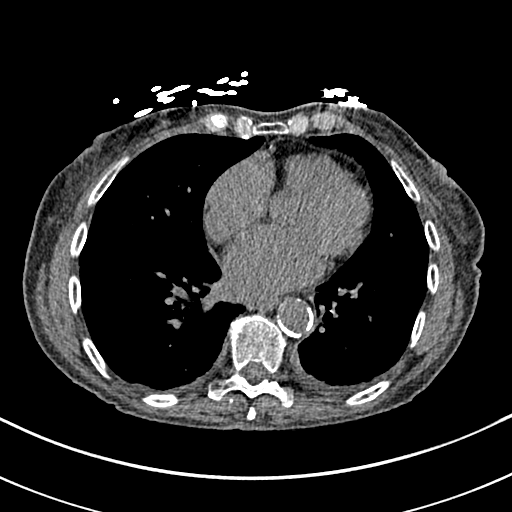
[im 77/170  lung]
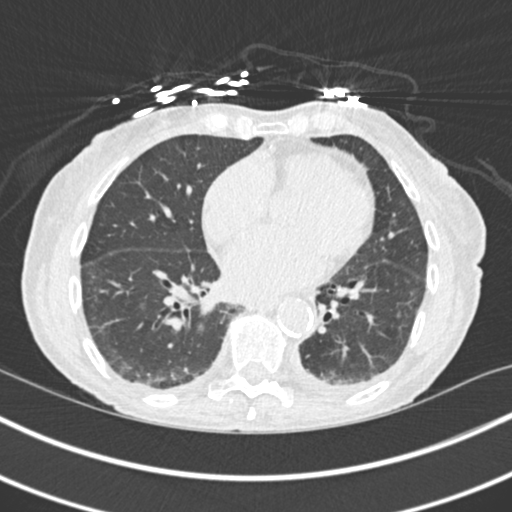
[im 93/170  lung]
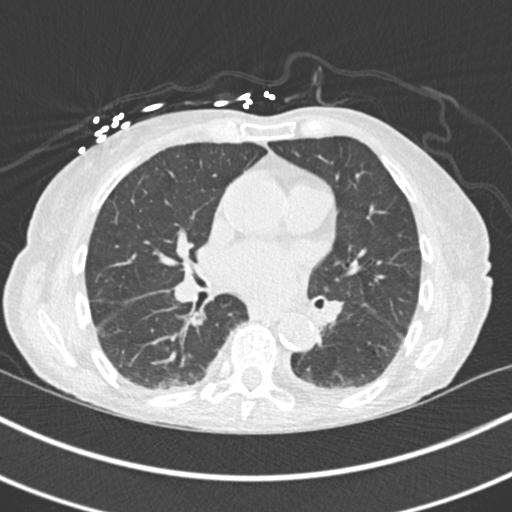
[im 108/170  lung]
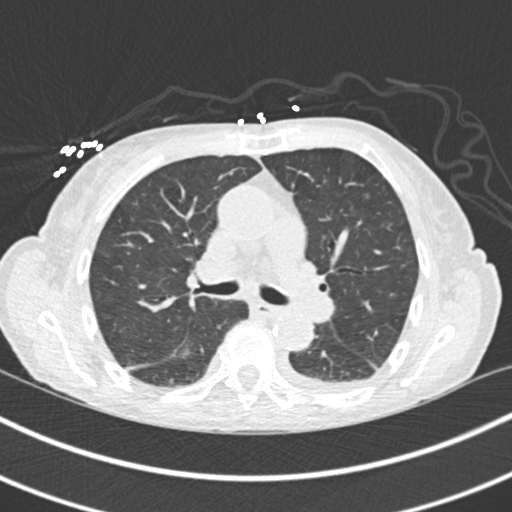
[im 123/170  lung]
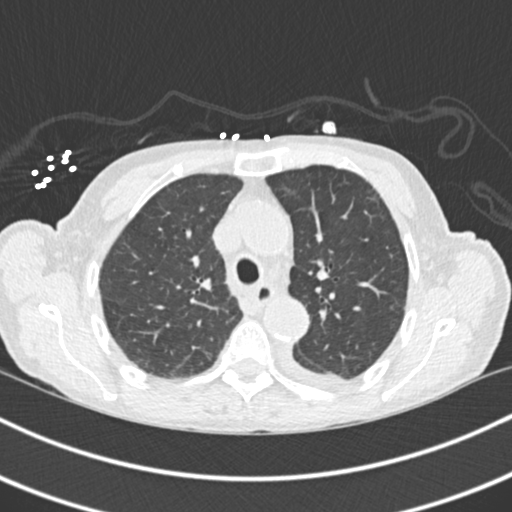
[im 139/170  mediastinal]
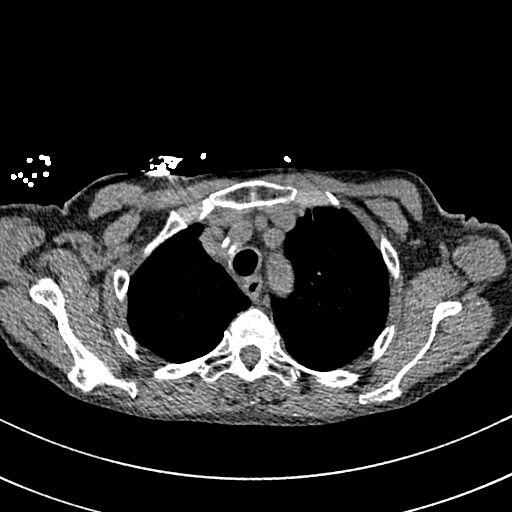
[im 139/170  lung]
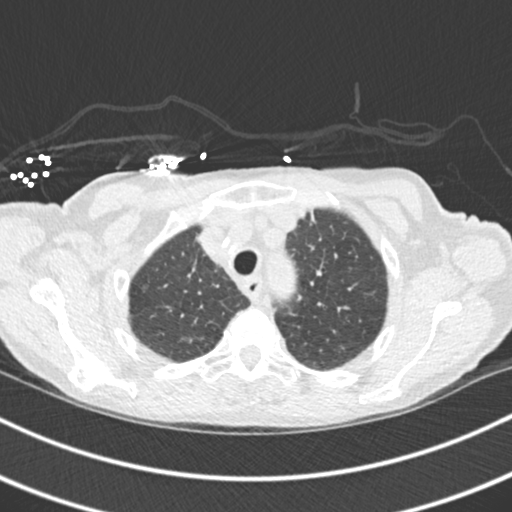
[im 154/170  lung]
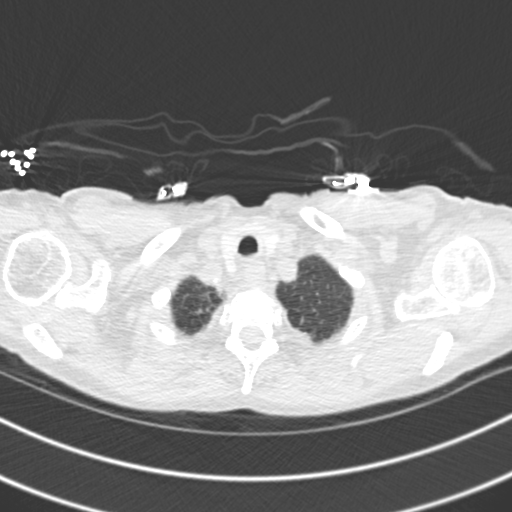

[Series 6: coronal · coronal · 0.59mm/px · 3 of 98 slices shown]
[im 20/98  lung]
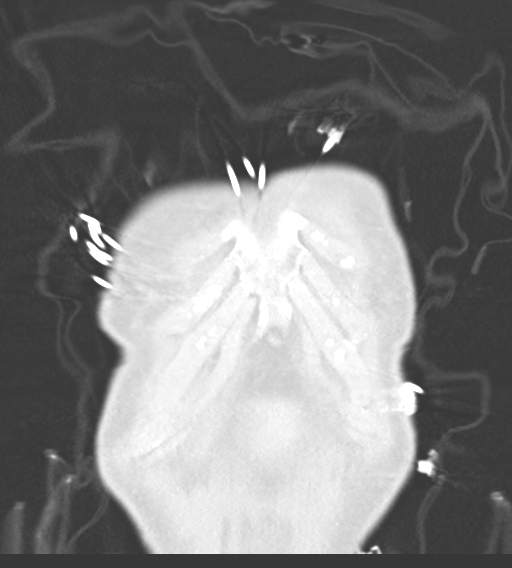
[im 39/98  lung]
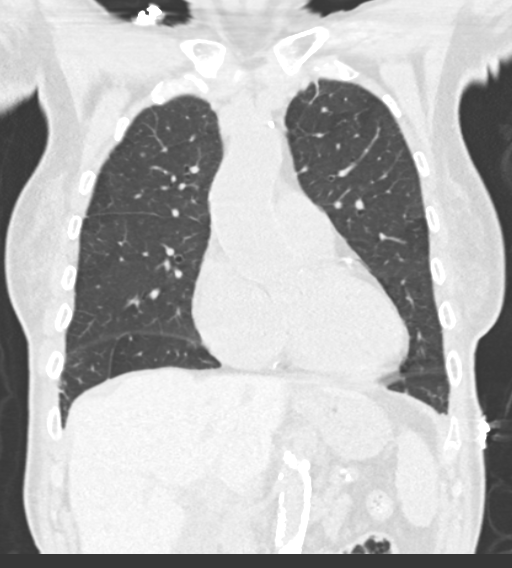
[im 59/98  lung]
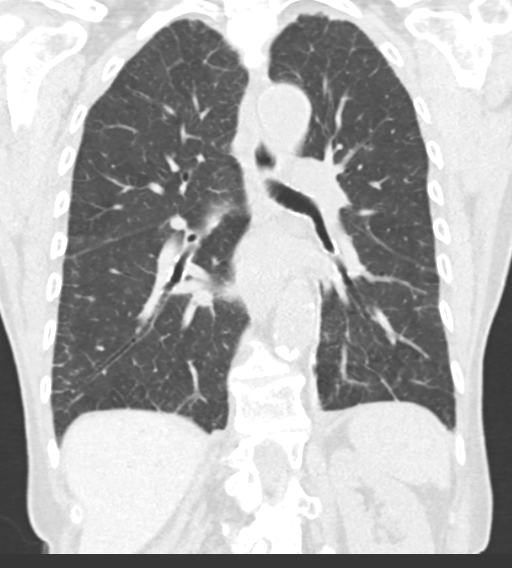

[13 of 36 positions shown; findings below may reference images not displayed]

FINDINGS: Mediastinum/Lymph Nodes: Mild atherosclerosis of the thoracic aorta.
No aneurysm or periaortic soft tissue stranding. Coronary artery
calcifications are seen. No pericardial effusion. No mediastinal or
evidence of hilar adenopathy.

Lungs/Pleura: Small left pleural effusion. Linear atelectasis or
scarring in the left lower and right middle lobe. There is smooth
septal thickening, mild. No airspace consolidation to suggest
pneumonia.

Upper abdomen: No acute findings.

Musculoskeletal: No chest wall mass or suspicious bone lesions
identified. Minimal anterior wedging of T11 vertebral body.
Prominent Schmorl's node superior endplate of T12.
IMPRESSION: 1. Small left pleural effusion. Smooth septal thickening is
suspicious for mild pulmonary edema.
2. Linear atelectasis or scarring.  No pneumonia.
3. Atherosclerosis including coronary artery calcifications.

## 2017-05-29 MED ORDER — RIVAROXABAN 15 MG PO TABS: 15.0000 mg | ORAL_TABLET | Freq: Every day | ORAL | 0 refills | Status: DC

## 2017-06-18 NOTE — Telephone Encounter (Signed)
Patient calling the office for samples of medication:   1.  What medication and dosage are you requesting samples for? Xarelto 15 mg  2.  Are you currently out of this medication? No, has enough for 10 days

## 2017-06-18 NOTE — Telephone Encounter (Signed)
Medication samples have been provided to the patient.  Drug name: xarelto 15mg   Qty: 2 bottles  LOT: [id number]  Exp.Date: 12/20  LM that Samples left at front desk for patient pick-up.  Julaine Fusilkins, Elvena Oyer M 2:07 PM 06/18/2017

## 2017-07-09 ENCOUNTER — Telehealth: Payer: Self-pay | Admitting: Cardiology

## 2017-07-09 MED ORDER — RIVAROXABAN 15 MG PO TABS: 15.0000 mg | ORAL_TABLET | Freq: Every day | ORAL | 6 refills | Status: DC

## 2017-07-09 NOTE — Telephone Encounter (Signed)
New Message       *STAT* If patient is at the pharmacy, call can be transferred to refill team.   1. Which medications need to be refilled? (please list name of each medication and dose if known)  xarelto 15 mg  2. Which pharmacy/location (including street and city if local pharmacy) is [facility]   3. Do they need a 30 day or 90 day supply? 30

## 2017-07-09 NOTE — Telephone Encounter (Signed)
Patient calling the office for samples of medication: ° ° °1.  What medication and dosage are you requesting samples for? Xarelto ° °2.  Are you currently out of this medication?  A few ° ° ° °

## 2017-07-09 NOTE — Telephone Encounter (Signed)
Rx has been sent to the pharmacy electronically. ° °

## 2017-07-09 NOTE — Telephone Encounter (Signed)
Left msg to advise that we are currently out of samples of this medication, and that she may retry later this week.

## 2017-08-07 ENCOUNTER — Telehealth: Payer: Self-pay | Admitting: Cardiology

## 2017-08-07 NOTE — Telephone Encounter (Signed)
New message    Patient calling the office for samples of medication:   1.  What medication and dosage are you requesting samples for? Rivaroxaban (XARELTO) 15 MG TABS tablet  2.  Are you currently out of this medication? 6 more

## 2017-08-07 NOTE — Telephone Encounter (Signed)
Medication samples have been provided to the patient.  Drug name: xarelto 15mg   Qty: 3 bottles  LOT: [id number]  Exp.Date: 10/2019  Samples left at front desk for patient pick-up. Patient notified.  Julaine Fusilkins, Phylisha Dix M 2:58 PM 08/07/2017

## 2017-09-05 ENCOUNTER — Telehealth: Payer: Self-pay | Admitting: Cardiology

## 2017-09-05 NOTE — Telephone Encounter (Signed)
New message ° ° ° °Patient calling the office for samples of medication: ° ° °1.  What medication and dosage are you requesting samples for? Rivaroxaban (XARELTO) 15 MG TABS tablet ° °2.  Are you currently out of this medication? yes ° ° °

## 2017-09-05 NOTE — Telephone Encounter (Signed)
Returned call to patient Xarelto 15 mg samples left at [facility] office front desk. 

## 2017-09-12 NOTE — Progress Notes (Signed)
Cardiology Office Note   Date:  09/16/2017   ID:  [name] Jones, [name] Jones 03/19/1924, MRN [id number]  PCP:  Burton Apley, MD  Cardiologist:   Delle Andrzejewski [place], MD   Chief Complaint  Patient presents with  . Follow-up  . Atrial Fibrillation  . Congestive Heart Failure      History of Present Illness: [name] Jones is a 84 y.o. female who presents for follow up CHF and atrial fibrillation.  She has a history of HTN. In early November 2016 she was noted to have new onset atrial fibrillation. She was started on Xarelto. She deteriorated and was admitted with acute diastolic CHF. She was diuresed with IV lasix. She had hyponatremia. She had an Echo in May 2016 while in NSR showing Normal LV function with grade 2 diastolic dysfunction. There was moderate MR and mild to moderate TR. Mild pulmonary HTN. Repeat Echo in hospital showed normal LV function with severe MR and TR and mod to severe biatrial enlargement.  She later underwent DCCV on 09/15/15. On follow up she was noted to be bradycardic with HR into low 40s. Her metoprolol and cardizem doses were reduced.    On follow up today she is seen with her daughter. She is doing very well. States she is "fiesty". Denies any palpitations, dyspnea, or edema. No chest pain. Energy level is good. No bleeding. Labs followed by Dr. Su Hilt.   Past Medical History:  Diagnosis Date  . Atrial fibrillation (HCC)   . Chronic diastolic CHF (congestive heart failure) (HCC)   . Glaucoma   . Hypertension   . Hyponatremia   . Iron deficiency anemia   . Mitral insufficiency   . TIA (transient ischemic attack) 1997  . Tricuspid insufficiency     Past Surgical History:  Procedure Laterality Date  . ABDOMINAL HYSTERECTOMY    . CARDIOVERSION N/A 09/15/2015   Procedure: CARDIOVERSION;  Surgeon: Wendall Stade, MD;  Location[facility] ENDOSCOPY;  Service: Cardiovascular;  Laterality: N/A;  . CATARACT EXTRACTION    . DIAGNOSTIC LAPAROSCOPY    . STRABISMUS  SURGERY    . TONSILLECTOMY       Current Outpatient Medications  Medication Sig Dispense Refill  . acetaminophen (TYLENOL) 325 MG tablet Take 650 mg by mouth at bedtime as needed for moderate pain.     Marland Kitchen amiodarone (PACERONE) 200 MG tablet TAKE 1 TABLET BY MOUTH EVERY DAY 90 tablet 3  . calcium carbonate (OS-CAL - DOSED IN MG OF ELEMENTAL CALCIUM) 1250 (500 CA) MG tablet Take 1 tablet by mouth every evening.     . carteolol (OCUPRESS) 1 % ophthalmic solution Place 1 drop into both eyes 2 (two) times daily.    . furosemide (LASIX) 40 MG tablet TAKE 1 TABLET BY MOUTH TWICE A DAY 180 tablet 1  . KLOR-CON M10 10 MEQ tablet TAKE 1 TABLET (10 MEQ TOTAL) BY MOUTH 2 (TWO) TIMES DAILY. 180 tablet 3  . latanoprost (XALATAN) 0.005 % ophthalmic solution Place 1 drop into both eyes at bedtime.    . Menthol (ICY HOT) 5 % PTCH Apply 1 patch topically every morning. Apply to left knee at 8AM and removed qhs    . metoprolol succinate (TOPROL-XL) 25 MG 24 hr tablet Take 1 tablet (25 mg total) by mouth daily. 90 tablet 3  . Multiple Vitamin (MULTIVITAMIN WITH MINERALS) TABS tablet Take 1 tablet by mouth every evening.     . Rivaroxaban (XARELTO) 15 MG TABS tablet Take 1 tablet (  15 mg total) by mouth daily with supper. 30 tablet 6  . UNABLE TO FIND Med Name: Med Pass 120 mL by mouth 2 times daily    . vitamin C (ASCORBIC ACID) 500 MG tablet Take 500 mg by mouth daily with lunch.      No current facility-administered medications for this visit.     Allergies:   Lisinopril    Social History:  The patient  reports that  has never smoked. she has never used smokeless tobacco. She reports that she drinks alcohol. She reports that she does not use drugs.   Family History:  The patient's family history includes Heart attack in her brother; Heart attack (age of onset: 5352) in her son; Heart attack (age of onset: 2176) in her father; Heart disease in her mother; Hypertension in her son; Ovarian cancer (age of onset:  4852) in her mother.    ROS:  Please see the history of present illness.   Otherwise, review of systems are positive for none.   All other systems are reviewed and negative.    PHYSICAL EXAM: VS:  BP (!) 149/58   Pulse (!) 55   Ht 5\' 1"  (1.549 m)   Wt 122 lb 3.2 oz (55.4 kg)   BMI 23.09 kg/m  , BMI Body mass index is 23.09 kg/m. GENERAL:  Well appearing elderly WF in NAD HEENT:  PERRL, EOMI, sclera are clear. Oropharynx is clear. NECK:  No jugular venous distention, carotid upstroke brisk and symmetric, no bruits, no thyromegaly or adenopathy LUNGS:  Clear to auscultation bilaterally CHEST:  Unremarkable HEART:  RRR,  PMI not displaced or sustained,S1 and S2 within normal limits, no S3, no S4: no clicks, no rubs, no murmurs ABD:  Soft, nontender. BS +, no masses or bruits. No hepatomegaly, no splenomegaly EXT:  2 + pulses throughout, no edema, no cyanosis no clubbing SKIN:  Warm and dry.  No rashes NEURO:  Alert and oriented x 3. Cranial nerves II through XII intact. PSYCH:  Cognitively intact   Recent Labs: Lab Results  Component Value Date   WBC 5.8 08/03/2016   HGB 12.2 08/03/2016   HCT 36 08/03/2016   PLT 175 08/03/2016   GLUCOSE 97 06/12/2016   CHOL 161 06/08/2015   TRIG 76 06/08/2015   HDL 53 06/08/2015   LDLCALC 93 06/08/2015   ALT 29 03/28/2016   AST 31 03/28/2016   NA 135 (A) 08/03/2016   K 4.1 08/03/2016   CL 98 (L) 06/12/2016   CREATININE 0.8 08/03/2016   BUN 8 08/03/2016   CO2 28 06/12/2016   TSH 2.55 03/22/2016   INR 3.08 (H) 12/13/2015       Lipid Panel    Component Value Date/Time   CHOL 161 06/08/2015 2322   TRIG 76 06/08/2015 2322   HDL 53 06/08/2015 2322   CHOLHDL 3.0 06/08/2015 2322   VLDL 15 06/08/2015 2322   LDLCALC 93 06/08/2015 2322      Wt Readings from Last 3 Encounters:  09/16/17 122 lb 3.2 oz (55.4 kg)  03/18/17 119 lb 3.2 oz (54.1 kg)  09/19/16 120 lb (54.4 kg)      Other studies Reviewed: Additional studies/ records  that were reviewed today include:    ASSESSMENT AND PLAN:  1.  Atrial fibrillation s/p DCCV in February 2017. Maintaining NSR on amiodarone. Will continue amiodarone. Continue metoprolol and Xarelto. Will request a copy of last lab work from Dr. Su Hiltoberts.   2. Chronic diastolic CHF.  Exacerbated by Afib. On appropriate diuretic therapy and appears euvolemic.  3. Hyponatremia. Corrected.   4. Moderate to severe MR and TR.   Will treat medically. She is not a candidate for valve surgery.   5. Iron deficiency anemia.  6. HTN- well controlled.   Current medicines are reviewed at length with the patient today.  The patient does not have concerns regarding medicines.  The following changes have been made:  no change  Labs/ tests ordered today include:   No orders of the defined types were placed in this encounter.   Disposition:   FU with 6 months.  Signed, Larin Weissberg [place], MD  09/16/2017 2:09 PM    [facility] [address] [place], [place], [address] Phone [phone], Fax [phone]

## 2017-09-16 ENCOUNTER — Encounter: Payer: Self-pay | Admitting: Cardiology

## 2017-09-16 ENCOUNTER — Ambulatory Visit (INDEPENDENT_AMBULATORY_CARE_PROVIDER_SITE_OTHER): Payer: Medicare Other | Admitting: Cardiology

## 2017-09-16 VITALS — BP 149/58 | HR 55 | Ht 61.0 in | Wt 122.2 lb

## 2017-09-16 DIAGNOSIS — I480 Paroxysmal atrial fibrillation: Secondary | ICD-10-CM

## 2017-09-16 DIAGNOSIS — I10 Essential (primary) hypertension: Secondary | ICD-10-CM

## 2017-09-16 DIAGNOSIS — I5032 Chronic diastolic (congestive) heart failure: Secondary | ICD-10-CM

## 2017-09-16 NOTE — Patient Instructions (Addendum)
Continue your current therapy  I will see you in 6 months.   

## 2017-10-21 ENCOUNTER — Telehealth: Payer: Self-pay | Admitting: Cardiology

## 2017-10-21 NOTE — Telephone Encounter (Signed)
Patient calling the office for samples of medication: ° ° °1.  What medication and dosage are you requesting samples for?Xarelto °2.  Are you currently out of this medication?  ° ° ° °

## 2017-10-21 NOTE — Telephone Encounter (Signed)
Returned call to patient Xarelto 15 mg samples left at [facility] office front desk. 

## 2017-11-05 ENCOUNTER — Other Ambulatory Visit: Payer: Self-pay | Admitting: Cardiology

## 2017-11-05 DIAGNOSIS — I480 Paroxysmal atrial fibrillation: Secondary | ICD-10-CM

## 2017-11-05 DIAGNOSIS — I5032 Chronic diastolic (congestive) heart failure: Secondary | ICD-10-CM

## 2017-11-05 NOTE — Telephone Encounter (Signed)
Rx has been sent to the pharmacy electronically. ° °

## 2017-11-11 ENCOUNTER — Telehealth: Payer: Self-pay | Admitting: Cardiology

## 2017-11-11 NOTE — Telephone Encounter (Signed)
Returned call to patient left message on personal voice Xarelto 20 mg left at [facility] office front desk.

## 2017-11-11 NOTE — Telephone Encounter (Signed)
Correction Xarelto 15 mg left at [facility] office front desk.

## 2017-11-11 NOTE — Telephone Encounter (Signed)
Patient calling the office for samples of medication: ° ° °1.  What medication and dosage are you requesting samples for? Xarelto ° °2.  Are you currently out of this medication? Just a few ° °

## 2017-12-05 ENCOUNTER — Telehealth: Payer: Self-pay | Admitting: Cardiology

## 2017-12-05 NOTE — Telephone Encounter (Signed)
New Message ° ° °Patient calling the office for samples of medication: ° ° °1.  What medication and dosage are you requesting samples for? Rivaroxaban (XARELTO) 15 MG TABS tablet ° °2.  Are you currently out of this medication? yes ° ° °

## 2017-12-05 NOTE — Telephone Encounter (Signed)
Spoke with Pt and informed that samples are available for pick up at the front desk.   Xarelto 15 mg Qty: 2 bottles Lot# [id number] Exp: 2/21

## 2017-12-24 ENCOUNTER — Telehealth: Payer: Self-pay | Admitting: Cardiology

## 2017-12-24 NOTE — Telephone Encounter (Signed)
Medication samples have been provided to the patient.  Drug name: Xarelto 15 mg   Qty: 21    LOT: [id number]  Exp.Date: 12/20  Samples left at front desk for patient pick-up. Patient notified.

## 2017-12-24 NOTE — Telephone Encounter (Signed)
New message   Patient calling the office for samples of medication:   1.  What medication and dosage are you requesting samples for?  Xarelto 15 mg   2.  Are you currently out of this medication?  Will be out Thursday

## 2018-01-10 ENCOUNTER — Telehealth: Payer: Self-pay | Admitting: Cardiology

## 2018-01-10 NOTE — Telephone Encounter (Signed)
New Message ° ° °Patient calling the office for samples of medication: ° ° °1.  What medication and dosage are you requesting samples for? Rivaroxaban (XARELTO) 15 MG TABS tablet ° °2.  Are you currently out of this medication? yes ° ° °

## 2018-01-10 NOTE — Telephone Encounter (Signed)
Medication samples have been provided to the patient.  Drug name: xarelto 15mg   Qty: 3 bottles  LOT: [id number]  Exp.Date: 10/2019  Samples left at front desk for patient pick-up. Patient notified.  Julaine Fusilkins, Jenna M 10:46 AM 01/10/2018

## 2018-02-03 ENCOUNTER — Other Ambulatory Visit: Payer: Self-pay | Admitting: Cardiology

## 2018-02-03 MED ORDER — RIVAROXABAN 15 MG PO TABS: 15.0000 mg | ORAL_TABLET | Freq: Every day | ORAL | 0 refills | Status: DC

## 2018-02-03 NOTE — Telephone Encounter (Signed)
Patient calling the office for samples of medication: ° ° °1.  What medication and dosage are you requesting samples for? Xarelto ° °2.  Are you currently out of this medication?  A few left ° ° °

## 2018-02-03 NOTE — Telephone Encounter (Signed)
PATIENT AWARE SAMPLES  X 3 ARE AVAILABLE FOR PICK UP -

## 2018-02-18 ENCOUNTER — Telehealth: Payer: Self-pay | Admitting: Cardiology

## 2018-02-18 NOTE — Telephone Encounter (Signed)
New Message:     [facility] [place] [facility]..Pt needs her recent office visits notes, and list of her medication fax to [phone] Att: Ozella RocksMichelle Peraldo,

## 2018-02-24 MED ORDER — RIVAROXABAN 15 MG PO TABS: 15.0000 mg | ORAL_TABLET | Freq: Every day | ORAL | 0 refills | Status: DC

## 2018-02-24 NOTE — Telephone Encounter (Signed)
Spoke to patient aware samples are available for pick up.

## 2018-02-24 NOTE — Telephone Encounter (Signed)
PT NOTIFIED SHE STATES THAT SHE NEEDS XARELTO 15MG  SAMPLES PLEASE CALL

## 2018-03-03 ENCOUNTER — Ambulatory Visit: Payer: Medicare Other | Admitting: Cardiology

## 2018-03-06 ENCOUNTER — Other Ambulatory Visit: Payer: Self-pay | Admitting: Cardiology

## 2018-03-07 ENCOUNTER — Telehealth: Payer: Self-pay | Admitting: Cardiology

## 2018-03-07 NOTE — Telephone Encounter (Signed)
New Message    Patient calling the office for samples of medication:   1.  What medication and dosage are you requesting samples for? Rivaroxaban (XARELTO) 15 MG TABS tablet    2.  Are you currently out of this medication? Four days remaining

## 2018-03-07 NOTE — Telephone Encounter (Signed)
Samples placed up front. LVM advising to come pick them up, left call back number if any questions.    Medication given: Xarelto 20mg  Lot #: [id number] Expiration: 04/21 Qty: 4 bottles (21month)

## 2018-03-17 ENCOUNTER — Ambulatory Visit: Payer: Medicare Other | Admitting: Cardiology

## 2018-03-18 ENCOUNTER — Encounter: Payer: Self-pay | Admitting: Cardiology

## 2018-04-05 NOTE — Progress Notes (Signed)
Cardiology Office Note   Date:  04/08/2018   ID:  Delmer Islamlizabeth W Rue, DOB 1924/04/09, MRN [id number]  PCP:  Burton Apleyoberts, Ronald, MD  Cardiologist:   Amina Menchaca Swazil[place]Jordan, MD   Chief Complaint  Patient presents with  . Congestive Heart Failure  . Atrial Fibrillation      History of Present Illness: [name] Jones is a 84 y.o. female who presents for follow up CHF and atrial fibrillation.  She has a history of HTN. In early November 2016 she was noted to have new onset atrial fibrillation. She was started on Xarelto. She deteriorated and was admitted with acute diastolic CHF. She was diuresed with IV lasix. She had hyponatremia. She had an Echo in May 2016 while in NSR showing Normal LV function with grade 2 diastolic dysfunction. There was moderate MR and mild to moderate TR. Mild pulmonary HTN. Repeat Echo in hospital showed normal LV function with severe MR and TR and mod to severe biatrial enlargement.  She later underwent DCCV on 09/15/15. On follow up she was noted to be bradycardic with HR into low 40s. Her metoprolol and cardizem doses were reduced.    On follow up today she is seen with her daughter. She is really  doing very well. She has help at home but still does a little driving. She is planning on moving to assisted living next year.  Denies any palpitations  or edema. She does have some DOE. No chest pain. Energy level is good. No bleeding.   Past Medical History:  Diagnosis Date  . Atrial fibrillation (HCC)   . Chronic diastolic CHF (congestive heart failure) (HCC)   . Glaucoma   . Hypertension   . Hyponatremia   . Iron deficiency anemia   . Mitral insufficiency   . TIA (transient ischemic attack) 1997  . Tricuspid insufficiency     Past Surgical History:  Procedure Laterality Date  . ABDOMINAL HYSTERECTOMY    . CARDIOVERSION N/A 09/15/2015   Procedure: CARDIOVERSION;  Surgeon: Wendall StadePeter C Nishan, MD;  [facility];  Service: Cardiovascular;  Laterality: N/A;  .  CATARACT EXTRACTION    . DIAGNOSTIC LAPAROSCOPY    . STRABISMUS SURGERY    . TONSILLECTOMY       Current Outpatient Medications  Medication Sig Dispense Refill  . acetaminophen (TYLENOL) 325 MG tablet Take 650 mg by mouth at bedtime as needed for moderate pain.     Marland Kitchen. amiodarone (PACERONE) 200 MG tablet TAKE 1 TABLET BY MOUTH EVERY DAY 90 tablet 2  . calcium carbonate (OS-CAL - DOSED IN MG OF ELEMENTAL CALCIUM) 1250 (500 CA) MG tablet Take 1 tablet by mouth every evening.     . carteolol (OCUPRESS) 1 % ophthalmic solution Place 1 drop into both eyes 2 (two) times daily.    . furosemide (LASIX) 40 MG tablet TAKE 1 TABLET BY MOUTH TWICE A DAY 180 tablet 2  . KLOR-CON M10 10 MEQ tablet TAKE 1 TABLET (10 MEQ TOTAL) BY MOUTH 2 (TWO) TIMES DAILY. 180 tablet 3  . latanoprost (XALATAN) 0.005 % ophthalmic solution Place 1 drop into both eyes at bedtime.    . Menthol (ICY HOT) 5 % PTCH Apply 1 patch topically every morning. Apply to left knee at 8AM and removed qhs    . metoprolol succinate (TOPROL-XL) 25 MG 24 hr tablet TAKE 1 TABLET BY MOUTH EVERY DAY 90 tablet 3  . Multiple Vitamin (MULTIVITAMIN WITH MINERALS) TABS tablet Take 1 tablet by mouth every  evening.     . Rivaroxaban (XARELTO) 15 MG TABS tablet Take 1 tablet (15 mg total) by mouth daily with supper. Lot [id number]  EXP 4/21 14 tablet 0  . UNABLE TO FIND Med Name: Med Pass 120 mL by mouth 2 times daily    . vitamin C (ASCORBIC ACID) 500 MG tablet Take 500 mg by mouth daily with lunch.      No current facility-administered medications for this visit.     Allergies:   Lisinopril    Social History:  The patient  reports that she has never smoked. She has never used smokeless tobacco. She reports that she drinks alcohol. She reports that she does not use drugs.   Family History:  The patient's family history includes Heart attack in her brother; Heart attack (age of onset: 38) in her son; Heart attack (age of onset: 57) in her father; Heart  disease in her mother; Hypertension in her son; Ovarian cancer (age of onset: 46) in her mother.    ROS:  Please see the history of present illness.   Otherwise, review of systems are positive for none.   All other systems are reviewed and negative.    PHYSICAL EXAM: VS:  BP (!) 184/84   Pulse 67   Ht 5' 1.5" (1.562 m)   Wt 121 lb 3.2 oz (55 kg)   BMI 22.53 kg/m  , BMI Body mass index is 22.53 kg/m. GENERAL:  Well appearing elderly WF in NAD HEENT:  PERRL, EOMI, sclera are clear. Oropharynx is clear. NECK:  No jugular venous distention, carotid upstroke brisk and symmetric, no bruits, no thyromegaly or adenopathy LUNGS:  Clear to auscultation bilaterally CHEST:  Unremarkable HEART:  RRR,  PMI not displaced or sustained,S1 and S2 within normal limits, no S3, no S4: no clicks, no rubs, no murmurs ABD:  Soft, nontender. BS +, no masses or bruits. No hepatomegaly, no splenomegaly EXT:  2 + pulses throughout, no edema, no cyanosis no clubbing SKIN:  Warm and dry.  No rashes NEURO:  Alert and oriented x 3. Cranial nerves II through XII intact. PSYCH:  Cognitively intact     Recent Labs: Lab Results  Component Value Date   WBC 5.8 08/03/2016   HGB 12.2 08/03/2016   HCT 36 08/03/2016   PLT 175 08/03/2016   GLUCOSE 97 06/12/2016   CHOL 161 06/08/2015   TRIG 76 06/08/2015   HDL 53 06/08/2015   LDLCALC 93 06/08/2015   ALT 29 03/28/2016   AST 31 03/28/2016   NA 135 (A) 08/03/2016   K 4.1 08/03/2016   CL 98 (L) 06/12/2016   CREATININE 0.8 08/03/2016   BUN 8 08/03/2016   CO2 28 06/12/2016   TSH 2.55 03/22/2016   INR 3.08 (H) 12/13/2015       Lipid Panel    Component Value Date/Time   CHOL 161 06/08/2015 2322   TRIG 76 06/08/2015 2322   HDL 53 06/08/2015 2322   CHOLHDL 3.0 06/08/2015 2322   VLDL 15 06/08/2015 2322   LDLCALC 93 06/08/2015 2322      Wt Readings from Last 3 Encounters:  04/08/18 121 lb 3.2 oz (55 kg)  09/16/17 122 lb 3.2 oz (55.4 kg)  03/18/17 119  lb 3.2 oz (54.1 kg)      Other studies Reviewed: Additional studies/ records that were reviewed today include:  Labs dated 05/30/17: CBC and CMET normal. TSH normal. Cholesterol 206, triglycerides 129, HDL 80, LDL 100.   Ecg  today shows NSR rate 67 with normal Ecg. I have personally reviewed and interpreted this study.   ASSESSMENT AND PLAN:  1.  Atrial fibrillation s/p DCCV in February 2017. Maintaining NSR on amiodarone. Will continue amiodarone. Continue metoprolol and Xarelto. Will check chemistries, TSH, and CBC today and every 6 months.   2. Chronic diastolic CHF. Exacerbated by Afib. On appropriate diuretic therapy and is well compensated.   3. Hyponatremia. Follow up chemistries  4. Moderate to severe MR and TR.   Will treat medically. She is not a candidate for valve surgery.   5. Iron deficiency anemia. Check Hgb.   6. HTN- well controlled.     Disposition:   FU with 6 months.  Signed, Tovah Slavick [place], MD  04/08/2018 3:09 PM    [facility] [address] [place], [place], [address] Phone [phone], Fax [phone]

## 2018-04-08 ENCOUNTER — Other Ambulatory Visit: Payer: Self-pay

## 2018-04-08 ENCOUNTER — Ambulatory Visit (INDEPENDENT_AMBULATORY_CARE_PROVIDER_SITE_OTHER): Payer: Medicare Other | Admitting: Cardiology

## 2018-04-08 ENCOUNTER — Encounter: Payer: Self-pay | Admitting: Cardiology

## 2018-04-08 VITALS — BP 184/84 | HR 67 | Ht 61.5 in | Wt 121.2 lb

## 2018-04-08 DIAGNOSIS — I480 Paroxysmal atrial fibrillation: Secondary | ICD-10-CM

## 2018-04-08 DIAGNOSIS — I10 Essential (primary) hypertension: Secondary | ICD-10-CM

## 2018-04-08 DIAGNOSIS — I5032 Chronic diastolic (congestive) heart failure: Secondary | ICD-10-CM | POA: Diagnosis not present

## 2018-04-08 DIAGNOSIS — Z7901 Long term (current) use of anticoagulants: Secondary | ICD-10-CM | POA: Diagnosis not present

## 2018-04-08 NOTE — Patient Instructions (Addendum)
Continue your current therapy  We will check lab work today  I will see you in 6 months   

## 2018-04-08 NOTE — Addendum Note (Signed)
Addended by: Neoma LamingPUGH, Onaje Warne J on: 04/08/2018 03:26 PM   Modules accepted: Orders

## 2018-04-09 LAB — CBC WITH DIFFERENTIAL/PLATELET
Basophils Absolute: 0 10*3/uL (ref 0.0–0.2)
Basos: 0 %
EOS (ABSOLUTE): 0.1 10*3/uL (ref 0.0–0.4)
Eos: 2 %
Hematocrit: 33.7 % — ABNORMAL LOW (ref 34.0–46.6)
Hemoglobin: 11.4 g/dL (ref 11.1–15.9)
Immature Grans (Abs): 0 10*3/uL (ref 0.0–0.1)
Immature Granulocytes: 0 %
Lymphocytes Absolute: 0.9 10*3/uL (ref 0.7–3.1)
Lymphs: 16 %
MCH: 31.4 pg (ref 26.6–33.0)
MCHC: 33.8 g/dL (ref 31.5–35.7)
MCV: 93 fL (ref 79–97)
Monocytes Absolute: 0.8 10*3/uL (ref 0.1–0.9)
Monocytes: 13 %
Neutrophils Absolute: 3.9 10*3/uL (ref 1.4–7.0)
Neutrophils: 69 %
Platelets: 207 10*3/uL (ref 150–450)
RBC: 3.63 x10E6/uL — ABNORMAL LOW (ref 3.77–5.28)
RDW: 13 % (ref 12.3–15.4)
WBC: 5.8 10*3/uL (ref 3.4–10.8)

## 2018-04-09 LAB — BASIC METABOLIC PANEL
BUN/Creatinine Ratio: 17 (ref 12–28)
BUN: 17 mg/dL (ref 10–36)
CO2: 26 mmol/L (ref 20–29)
Calcium: 9.2 mg/dL (ref 8.7–10.3)
Chloride: 95 mmol/L — ABNORMAL LOW (ref 96–106)
Creatinine, Ser: 1 mg/dL (ref 0.57–1.00)
GFR calc Af Amer: 57 mL/min/{1.73_m2} — ABNORMAL LOW (ref >59)
GFR calc non Af Amer: 49 mL/min/{1.73_m2} — ABNORMAL LOW (ref >59)
Glucose: 97 mg/dL (ref 65–99)
Potassium: 4.6 mmol/L (ref 3.5–5.2)
Sodium: 136 mmol/L (ref 134–144)

## 2018-04-09 LAB — LIPID PANEL W/O CHOL/HDL RATIO
Cholesterol, Total: 206 mg/dL — ABNORMAL HIGH (ref 100–199)
HDL: 79 mg/dL (ref >39)
LDL Calculated: 95 mg/dL (ref 0–99)
Triglycerides: 158 mg/dL — ABNORMAL HIGH (ref 0–149)
VLDL Cholesterol Cal: 32 mg/dL (ref 5–40)

## 2018-04-09 LAB — HEPATIC FUNCTION PANEL
ALT: 18 IU/L (ref 0–32)
AST: 22 IU/L (ref 0–40)
Albumin: 4.7 g/dL — ABNORMAL HIGH (ref 3.2–4.6)
Alkaline Phosphatase: 99 IU/L (ref 39–117)
Bilirubin Total: 0.3 mg/dL (ref 0.0–1.2)
Bilirubin, Direct: 0.1 mg/dL (ref 0.00–0.40)
Total Protein: 6.7 g/dL (ref 6.0–8.5)

## 2018-04-09 LAB — TSH: TSH: 5.25 u[IU]/mL — ABNORMAL HIGH (ref 0.450–4.500)

## 2018-04-11 ENCOUNTER — Other Ambulatory Visit: Payer: Self-pay

## 2018-04-11 DIAGNOSIS — I480 Paroxysmal atrial fibrillation: Secondary | ICD-10-CM

## 2018-04-11 DIAGNOSIS — R7989 Other specified abnormal findings of blood chemistry: Secondary | ICD-10-CM

## 2018-04-17 ENCOUNTER — Other Ambulatory Visit: Payer: Self-pay | Admitting: Cardiology

## 2018-04-18 NOTE — Telephone Encounter (Signed)
Rx request sent to pharmacy.  

## 2018-05-12 ENCOUNTER — Telehealth: Payer: Self-pay

## 2018-05-12 NOTE — Telephone Encounter (Signed)
Patient came into office in need of xarelto 15 mg samples. She had to leave for another appointment but would return.   I have placed samples up front. Medication given: Xarelto 15mg  Lot #: [id number] Expiration: 05/2020 Qty: 4 bottles

## 2018-05-13 ENCOUNTER — Other Ambulatory Visit: Payer: Self-pay

## 2018-05-13 DIAGNOSIS — E039 Hypothyroidism, unspecified: Secondary | ICD-10-CM

## 2018-05-13 LAB — TSH: TSH: 5.65 u[IU]/mL — ABNORMAL HIGH (ref 0.450–4.500)

## 2018-05-13 LAB — T4, FREE: Free T4: 0.85 ng/dL (ref 0.82–1.77)

## 2018-05-13 MED ORDER — LEVOTHYROXINE SODIUM 25 MCG PO TABS: 25.0000 ug | ORAL_TABLET | Freq: Every day | ORAL | 6 refills | Status: DC

## 2018-06-10 ENCOUNTER — Telehealth: Payer: Self-pay | Admitting: Cardiology

## 2018-06-10 NOTE — Telephone Encounter (Signed)
Returned call to patient.She stated she had lab work done with PCP this week and thyroid still low.Stated PCP advised to increase synthroid to 50 mcg daily.Stated she wanted to make sure Dr.Jordan agreed.Lab work requested to be faxed for Dr.Jordan to review.

## 2018-06-10 NOTE — Telephone Encounter (Signed)
New Message:     Please call, concerning the  medicine that Dr Sw[place]il[place]Jordan gave her for her thyroid.

## 2018-06-17 NOTE — Telephone Encounter (Signed)
Returned call to patient.Spoke to son Judie GrieveBryan Dr.Jordan reviewed recent lab from PCP.He advised ok to increase Synthroid as advised 50 mcg daily.

## 2018-07-02 ENCOUNTER — Telehealth: Payer: Self-pay | Admitting: Cardiology

## 2018-07-02 NOTE — Telephone Encounter (Signed)
° °  Daughter calling with BP concerns    Pt c/o BP issue: STAT if pt c/o blurred vision, one-sided weakness or slurred speech  1. What are your last 5 BP readings? 189/73, 220/50 in doctors office  2. Are you having any other symptoms (ex. Dizziness, headache, blurred vision, passed out)? No  3. What is your BP issue? Patient had outpatient procedure today,BP has been elevated this afternoon

## 2018-07-02 NOTE — Telephone Encounter (Signed)
Received call back from daughter.  She states patient went to neurosurgery today to have a procedure and before procedure BP was 202/50.  Procedure was completed (radiofrequency ablation) and BP increased to 220/50 during procedure.   She states patient got home and her CNA took her BP and it was 232/94 at 130.   At 230 BP was 189/73.  States patient took a good nap and her CNA just retook her BP, it was 249/114.  Unsure what HR is.   Patient feels fine, denies HA, blurred vision, dizziness.   Denies increase in swelling or weight.  She is on Toprol XL 25 mg daily.    She is very concerned about her BP.      Spoke to pharmacist-advised to not take BP again until tomorrow.   Rest, hydrate.   Recheck BP tomorrow.  She believes this is coming from stress and anxiety of procedure today.   She advised that If patient starts experiencing symptoms to proceed to ER for evaluation.    Daughter verbalized understanding.

## 2018-07-02 NOTE — Telephone Encounter (Signed)
LMTCB

## 2018-07-03 NOTE — Telephone Encounter (Signed)
Spoke to patient's daughter Eber JonesCarolyn.She stated mother's B/P has been up and down.Stated B/P did go down yesterday after she rested last night 188/74.Stated B/P ranged yesterday 232/94,229/95,189/73,249/114.Stated this morning B/P 107/64.Stated this afternoon B/P back up systolic 200.Pulse has been in 50's.Stated her mother does not like to complain.She will not always tell her if she feels bad.Appointment scheduled with Dr.Jordan 07/08/18 at 11:00 am.Advised to bring B/P monitor,all medication,and B/P readings to appointment.Advised to go to ED if needed.I will speak to Dr.Jordan tomorrow.

## 2018-07-04 ENCOUNTER — Ambulatory Visit: Payer: Medicare Other | Admitting: Cardiology

## 2018-07-04 NOTE — Progress Notes (Signed)
Cardiology Office Note   Date:  07/08/2018   ID:  Lashai, Grosch 1923-08-26, MRN [id number]  PCP:  Burton Apley, MD  Cardiologist:   Malita Ignasiak [place], MD   Chief Complaint  Patient presents with  . Hypertension      History of Present Illness: [name] Jones is a 85 y.o. female who presents as a work in for evaluation of elevated BP.  She has a history of HTN. In early November 2016 she was noted to have new onset atrial fibrillation. She was started on Xarelto. She deteriorated and was admitted with acute diastolic CHF. She was diuresed with IV lasix. She had hyponatremia. She had an Echo in May 2016 while in NSR showing Normal LV function with grade 2 diastolic dysfunction. There was moderate MR and mild to moderate TR. Mild pulmonary HTN. Repeat Echo in hospital showed normal LV function with severe MR and TR and mod to severe biatrial enlargement.  She later underwent DCCV on 09/15/15. On follow up she was noted to be bradycardic with HR into low 40s. Her metoprolol and cardizem doses were reduced.    Recently she underwent a spinal injection per Neurosurgery. Following this her BP was markedly elevated to 240/115. This next morning BP came down to 107 systolic but by late afternoon was back up to 200 range.  She felt anxious and took a Xanax. No HA, dyspnea or chest pain  On follow up today she is seen with her daughter. BP has been monitored. BP readings range 123-160 systolic with normal diastolic readings. Still feels well.    Past Medical History:  Diagnosis Date  . Atrial fibrillation (HCC)   . Chronic diastolic CHF (congestive heart failure) (HCC)   . Glaucoma   . Hypertension   . Hyponatremia   . Iron deficiency anemia   . Mitral insufficiency   . TIA (transient ischemic attack) 1997  . Tricuspid insufficiency     Past Surgical History:  Procedure Laterality Date  . ABDOMINAL HYSTERECTOMY    . CARDIOVERSION N/A 09/15/2015   Procedure: CARDIOVERSION;   Surgeon: Wendall Stade, MD;  Location: [facility] ENDOSCOPY;  Service: Cardiovascular;  Laterality: N/A;  . CATARACT EXTRACTION    . DIAGNOSTIC LAPAROSCOPY    . STRABISMUS SURGERY    . TONSILLECTOMY       Current Outpatient Medications  Medication Sig Dispense Refill  . acetaminophen (TYLENOL) 325 MG tablet Take 650 mg by mouth at bedtime as needed for moderate pain.     Marland Kitchen ALPRAZolam (XANAX) 0.25 MG tablet Take 1/2 tablet daily if needed 30 tablet 5  . amiodarone (PACERONE) 200 MG tablet TAKE 1 TABLET BY MOUTH EVERY DAY 90 tablet 2  . calcium carbonate (CALTRATE 600) 1500 (600 Ca) MG TABS tablet Take 600 mg daily 30 tablet 6  . carteolol (OCUPRESS) 1 % ophthalmic solution Place 1 drop into both eyes 2 (two) times daily.    . cholecalciferol (VITAMIN D) 1000 units tablet Take 1 tablet (1,000 Units total) by mouth daily. 30 tablet 6  . furosemide (LASIX) 40 MG tablet TAKE 1 TABLET BY MOUTH TWICE A DAY 180 tablet 2  . KLOR-CON M10 10 MEQ tablet TAKE 1 TABLET (10 MEQ TOTAL) BY MOUTH 2 (TWO) TIMES DAILY. 180 tablet 1  . latanoprost (XALATAN) 0.005 % ophthalmic solution Place 1 drop into both eyes at bedtime.    Marland Kitchen levothyroxine (SYNTHROID) 50 MCG tablet Take 1 tablet (50 mcg total) by mouth daily before  breakfast. 30 tablet 6  . Menthol (ICY HOT) 5 % PTCH Apply 1 patch topically every morning. Apply to left knee at 8AM and removed qhs    . metoprolol succinate (TOPROL-XL) 25 MG 24 hr tablet TAKE 1 TABLET BY MOUTH EVERY DAY 90 tablet 3  . Multiple Vitamin (MULTIVITAMIN WITH MINERALS) TABS tablet Take 1 tablet by mouth every evening.     . Probiotic Product (RA PROBIOTIC COLON CARE) CAPS Take 1 daily 30 capsule 3  . Rivaroxaban (XARELTO) 15 MG TABS tablet Take 1 tablet (15 mg total) by mouth daily with supper. Lot [id number]  EXP 4/21 14 tablet 0  . Saccharomyces boulardii (PROBIOTIC) 250 MG CAPS Take 1 capsule by mouth daily for 30 days, THEN 1 capsule daily. 30 capsule 6  . vitamin C (ASCORBIC ACID) 500 MG  tablet Take 500 mg by mouth daily with lunch.     Marland Kitchen amLODipine (NORVASC) 2.5 MG tablet Take 1 tablet (2.5 mg total) by mouth daily. 30 tablet 6   No current facility-administered medications for this visit.     Allergies:   Lisinopril    Social History:  The patient  reports that she has never smoked. She has never used smokeless tobacco. She reports current alcohol use. She reports that she does not use drugs.   Family History:  The patient's family history includes Heart attack in her brother; Heart attack (age of onset: 43) in her son; Heart attack (age of onset: 34) in her father; Heart disease in her mother; Hypertension in her son; Ovarian cancer (age of onset: 13) in her mother.    ROS:  Please see the history of present illness.   Otherwise, review of systems are positive for none.   All other systems are reviewed and negative.    PHYSICAL EXAM: VS:  BP (!) 154/58   Pulse (!) 54   Ht 5' (1.524 m)   Wt 124 lb 4 oz (56.4 kg)   BMI 24.27 kg/m  , BMI Body mass index is 24.27 kg/m. GENERAL:  Well appearing, elderly WF in NAD HEENT:  PERRL, EOMI, sclera are clear. Oropharynx is clear. NECK:  No jugular venous distention, carotid upstroke brisk and symmetric, no bruits, no thyromegaly or adenopathy LUNGS:  Clear to auscultation bilaterally CHEST:  Unremarkable HEART:  RRR,  PMI not displaced or sustained,S1 and S2 within normal limits, no S3, no S4: no clicks, no rubs, no murmurs ABD:  Soft, nontender. BS +, no masses or bruits. No hepatomegaly, no splenomegaly EXT:  2 + pulses throughout, no edema, no cyanosis no clubbing SKIN:  Warm and dry.  No rashes NEURO:  Alert and oriented x 3. Cranial nerves II through XII intact. PSYCH:  Cognitively intact       Recent Labs: Lab Results  Component Value Date   WBC 5.8 04/08/2018   HGB 11.4 04/08/2018   HCT 33.7 (L) 04/08/2018   PLT 207 04/08/2018   GLUCOSE 97 04/08/2018   CHOL 206 (H) 04/08/2018   TRIG 158 (H) 04/08/2018     HDL 79 04/08/2018   LDLCALC 95 04/08/2018   ALT 18 04/08/2018   AST 22 04/08/2018   NA 136 04/08/2018   K 4.6 04/08/2018   CL 95 (L) 04/08/2018   CREATININE 1.00 04/08/2018   BUN 17 04/08/2018   CO2 26 04/08/2018   TSH 5.650 (H) 05/12/2018   INR 3.08 (H) 12/13/2015       Lipid Panel    Component Value  Date/Time   CHOL 206 (H) 04/08/2018 1523   TRIG 158 (H) 04/08/2018 1523   HDL 79 04/08/2018 1523   CHOLHDL 3.0 06/08/2015 2322   VLDL 15 06/08/2015 2322   LDLCALC 95 04/08/2018 1523      Wt Readings from Last 3 Encounters:  07/08/18 124 lb 4 oz (56.4 kg)  04/08/18 121 lb 3.2 oz (55 kg)  09/16/17 122 lb 3.2 oz (55.4 kg)      Other studies Reviewed: Additional studies/ records that were reviewed today include:  Labs dated 05/30/17: CBC and CMET normal. TSH normal. Cholesterol 206, triglycerides 129, HDL 80, LDL 100.    ASSESSMENT AND PLAN:  1.  Atrial fibrillation s/p DCCV in February 2017. Maintaining NSR on amiodarone. Will continue amiodarone. Continue metoprolol and Xarelto. Will check chemistries, TSH, and CBC today and every 6 months.   2. Chronic diastolic CHF. Exacerbated by Afib. On appropriate diuretic therapy and is well compensated.   3. Hyponatremia. Resolved.   4. Moderate to severe MR and TR.   Will treat medically. She is not a candidate for valve surgery.   5. Iron deficiency anemia. Check Hgb.   6. HTN- BP recently accelerated around time of spinal injection. BP control improved but still not optimal. Will add amlodipine 2.5 mg daily. Follow up in February.     Disposition:   FU with 6 months.  Signed, Ishaaq Penna [place]Jordan, MD  07/08/2018 11:34 AM    [facility] [address], [place], [place], [address] Phone [phone], Fax [phone]

## 2018-07-04 NOTE — Telephone Encounter (Signed)
Spoke to patient's daughter [name] JonesCarolyn.Dr.Jordan was made aware of patient's B/P.He advised no changes keep appointment 07/08/18 at 11:00 am.She stated mother's B/P today 126/57.She stated she found out yesterday her mother was worrying about her older sister who is being evaluated for memory problems.Stated she will keep appointment as planned.

## 2018-07-08 ENCOUNTER — Ambulatory Visit (INDEPENDENT_AMBULATORY_CARE_PROVIDER_SITE_OTHER): Payer: Medicare Other | Admitting: Cardiology

## 2018-07-08 ENCOUNTER — Encounter: Payer: Self-pay | Admitting: Cardiology

## 2018-07-08 VITALS — BP 154/58 | HR 54 | Ht 60.0 in | Wt 124.2 lb

## 2018-07-08 DIAGNOSIS — I480 Paroxysmal atrial fibrillation: Secondary | ICD-10-CM

## 2018-07-08 DIAGNOSIS — I10 Essential (primary) hypertension: Secondary | ICD-10-CM

## 2018-07-08 DIAGNOSIS — Z7901 Long term (current) use of anticoagulants: Secondary | ICD-10-CM

## 2018-07-08 MED ORDER — AMLODIPINE BESYLATE 2.5 MG PO TABS: 2.5000 mg | ORAL_TABLET | Freq: Every day | ORAL | 6 refills | Status: DC

## 2018-07-08 NOTE — Addendum Note (Signed)
Addended by: Neoma LamingPUGH, [place] Zeiss J on: 07/08/2018 11:40 AM   Modules accepted: Orders

## 2018-07-08 NOTE — Patient Instructions (Signed)
Add amlodipine 2.5 mg daily for blood pressure  Continue your other therapy  Follow up in 3 months

## 2018-07-17 ENCOUNTER — Telehealth: Payer: Self-pay | Admitting: Cardiology

## 2018-07-17 NOTE — Telephone Encounter (Signed)
Spoke with pt's daughter who states she is concerned that her mother's BP is dropping too low with the addition of amlodipine added on 07/08/18. BP reading are as followed; 12/24 -122/46 12/25 -124/43 -142/52 12/26 -111/41 -135/42  Daughter states pt is unable to voice if she is symptomatic and would like to know numbers as to when she should take her mother to the ED. Informed daughter to just monitor if top number drops below 90 and have nurse monitor for symptoms. Daughter states she is concerned that the bottom number is in the 40's. Explained to daughter that per Pharm D we aren't too concerned with a diastolic reading of in the 40's if pt is not having any symptoms. Daughter becames upset stating she does not trust what we are saying and voiced that she is not able to tell when her mother is having symptoms and need numbers. Informed daughter of symptoms of low BP. Daughter states she is going to just cut pt amlodipine dose in half and see how her mother does. Offered a appointment with APP but daughter declined and states she will call back Monday to speak with Dr. Elvis CoilJordan's nurse. Will route to MD and nurse to make aware.

## 2018-07-17 NOTE — Telephone Encounter (Signed)
New Message    Pt c/o medication issue:  1. Name of Medication: amLODipine   2. How are you currently taking this medication (dosage and times per day)? Daughter not sure   3. Are you having a reaction (difficulty breathing--STAT)? No  4. What is your medication issue? BP has dropped. Last BP was 111/41 Hear rate was in 50s. Daughter has questions and concerns

## 2018-07-17 NOTE — Telephone Encounter (Signed)
I think it is fine to cut dose of amlodipine in half.  Peter [place]ilandJordan MD, [facility]

## 2018-07-18 NOTE — Telephone Encounter (Signed)
Spoke to patient's daughter Eber JonesCarolyn Dr.Jordan advised to cut Amlodipine dose in half.Advised continue to monitor B/P and call back if needed.

## 2018-07-25 ENCOUNTER — Telehealth: Payer: Self-pay

## 2018-07-25 NOTE — Telephone Encounter (Signed)
Received a call from patient's daughter Eber Jones.She stated she is concerned about mother's B/P.Stated B/P has been ranging 140/40's to 125/51 pulse 50 to 55.Stated at 1:00 pm 100/32 pulse 52.Rechecked appox 5 min later 158/64,160/56 pulse 67.Stated she was complaining of sob just sitting in chair.Weight stable.No swelling noticed.Stated she is concerned of B/P difference and CHF.Advised I will send message to Dr.Jordan.

## 2018-07-25 NOTE — Telephone Encounter (Signed)
Returned call to patient's daughter Eber Jones Dr.Jordan's advice given.She stated mother stopped Amlodipine 1 week ago.Advised to monitor B/P and call back if needed.

## 2018-07-25 NOTE — Telephone Encounter (Signed)
We recently added low dose amlodipine due to severe hypertensive reaction to spinal injection. I would stop amlodipine and monitor BP. Should improve over the weekend.  Peter [place] MD, [facility]

## 2018-07-31 ENCOUNTER — Other Ambulatory Visit: Payer: Self-pay | Admitting: Cardiology

## 2018-07-31 DIAGNOSIS — I480 Paroxysmal atrial fibrillation: Secondary | ICD-10-CM

## 2018-07-31 DIAGNOSIS — I5032 Chronic diastolic (congestive) heart failure: Secondary | ICD-10-CM

## 2018-08-06 NOTE — Telephone Encounter (Signed)
Spoke to patient's daughter Eber Jones.She stated mother has been complaining of sob for the past 1 week.Stated she has sob just sitting.Stated her B/P ranging 140/50,110/40.Weight stable 120 lbs.Appointment offered today with PA but she is unable to bring her.Appointment scheduled with Joni Reining DNP 08/11/18 at 11:30.Advised to go to ED if sob becomes worse.

## 2018-08-10 NOTE — Progress Notes (Signed)
Cardiology Office Note   Date:  08/11/2018   ID:  [name] Jones, DOB 1924/01/20, MRN [id number]  PCP:  Burton Apleyoberts, Ronald, MD  Cardiologist: [place] Chief Complaint  Patient presents with  . Shortness of Breath    with no activity  . Hypertension  . Follow-up     History of Present Illness: [name] Jones is a 85 y.o. female who presents for ongoing assessment and management of hypertension, atrial fib, diastolic CHF, severe MR and TR and moderate biatrial enlargement. She had a DCCV in 08/2015 and was noted to be bradycardic with HR in the 40's and therefore, metoprolol and diltiazem doses were reduced. She has other history of anxiety and take Xanax on occasion.   Her daughter called our office on 07/17/2018 reporting that her mother's BP was too low on amlodipine dose. She was advised to cut it in half. After spinal injection in 07/2018, BP again decreased and amlodipine was discontinued. She is here for follow up.   She brings with her the BP's that she has been recording. Her BP is elevated in the am, and low in the pm  She has some dizziness with position change and mild shortness of breath with exertion.. She also states that she has had some sputum that is clear but sometimes has small streaks of blood.   Past Medical History:  Diagnosis Date  . Atrial fibrillation (HCC)   . Chronic diastolic CHF (congestive heart failure) (HCC)   . Glaucoma   . Hypertension   . Hyponatremia   . Iron deficiency anemia   . Mitral insufficiency   . TIA (transient ischemic attack) 1997  . Tricuspid insufficiency     Past Surgical History:  Procedure Laterality Date  . ABDOMINAL HYSTERECTOMY    . CARDIOVERSION N/A 09/15/2015   Procedure: CARDIOVERSION;  Surgeon: Wendall StadePeter C Nishan, MD;  [facility];  Service: Cardiovascular;  Laterality: N/A;  . CATARACT EXTRACTION    . DIAGNOSTIC LAPAROSCOPY    . STRABISMUS SURGERY    . TONSILLECTOMY       Current Outpatient Medications    Medication Sig Dispense Refill  . acetaminophen (TYLENOL) 325 MG tablet Take 650 mg by mouth at bedtime as needed for moderate pain.     Marland Kitchen. ALPRAZolam (XANAX) 0.25 MG tablet Take 1/2 tablet daily if needed 30 tablet 5  . amiodarone (PACERONE) 200 MG tablet TAKE 1 TABLET BY MOUTH EVERY DAY 90 tablet 1  . calcium carbonate (CALTRATE 600) 1500 (600 Ca) MG TABS tablet Take 600 mg daily 30 tablet 6  . carteolol (OCUPRESS) 1 % ophthalmic solution Place 1 drop into both eyes 2 (two) times daily.    . cholecalciferol (VITAMIN D) 1000 units tablet Take 1 tablet (1,000 Units total) by mouth daily. 30 tablet 6  . furosemide (LASIX) 40 MG tablet TAKE 1 TABLET BY MOUTH TWICE A DAY 180 tablet 1  . KLOR-CON M10 10 MEQ tablet TAKE 1 TABLET (10 MEQ TOTAL) BY MOUTH 2 (TWO) TIMES DAILY. 180 tablet 1  . latanoprost (XALATAN) 0.005 % ophthalmic solution Place 1 drop into both eyes at bedtime.    Marland Kitchen. levothyroxine (SYNTHROID) 50 MCG tablet Take 1 tablet (50 mcg total) by mouth daily before breakfast. 30 tablet 6  . Menthol (ICY HOT) 5 % PTCH Apply 1 patch topically every morning. Apply to left knee at 8AM and removed qhs    . metoprolol succinate (TOPROL-XL) 25 MG 24 hr tablet TAKE 1 TABLET BY MOUTH EVERY  DAY 90 tablet 3  . Multiple Vitamin (MULTIVITAMIN WITH MINERALS) TABS tablet Take 1 tablet by mouth every evening.     . Probiotic Product (RA PROBIOTIC COLON CARE) CAPS Take 1 daily 30 capsule 3  . Rivaroxaban (XARELTO) 15 MG TABS tablet Take 1 tablet (15 mg total) by mouth daily with supper. Lot [id number]  EXP 4/21 14 tablet 0  . Saccharomyces boulardii (PROBIOTIC) 250 MG CAPS Take 1 capsule by mouth daily for 30 days, THEN 1 capsule daily. 30 capsule 6  . vitamin C (ASCORBIC ACID) 500 MG tablet Take 500 mg by mouth daily with lunch.      No current facility-administered medications for this visit.     Allergies:   Lisinopril    Social History:  The patient  reports that she has never smoked. She has never used  smokeless tobacco. She reports current alcohol use. She reports that she does not use drugs.   Family History:  The patient's family history includes Heart attack in her brother; Heart attack (age of onset: 39) in her son; Heart attack (age of onset: 57) in her father; Heart disease in her mother; Hypertension in her son; Ovarian cancer (age of onset: 71) in her mother.    ROS: All other systems are reviewed and negative. Unless otherwise mentioned in H&P    PHYSICAL EXAM: VS:  BP (!) 176/64   Pulse 65   Ht 5' (1.524 m)   Wt 124 lb (56.2 kg)   SpO2 97%   BMI 24.22 kg/m  , BMI Body mass index is 24.22 kg/m. GEN: Well nourished, well developed, in no acute distress HEENT: normal Neck: no JVD, carotid bruits, or masses Cardiac: RRR; 2/6 systolic murmurs, rubs, or gallops,no edema  Respiratory:  Clear to auscultation bilaterally, normal work of breathing GI: soft, nontender, nondistended, + BS MS: no deformity or atrophy Skin: warm and dry, no rash Neuro:  Strength and sensation are intact Psych: euthymic mood, full affect   EKG:  Not completed this office visit.   Recent Labs: 04/08/2018: ALT 18; BUN 17; Creatinine, Ser 1.00; Hemoglobin 11.4; Platelets 207; Potassium 4.6; Sodium 136 05/12/2018: TSH 5.650    Lipid Panel    Component Value Date/Time   CHOL 206 (H) 04/08/2018 1523   TRIG 158 (H) 04/08/2018 1523   HDL 79 04/08/2018 1523   CHOLHDL 3.0 06/08/2015 2322   VLDL 15 06/08/2015 2322   LDLCALC 95 04/08/2018 1523      Wt Readings from Last 3 Encounters:  08/11/18 124 lb (56.2 kg)  07/08/18 124 lb 4 oz (56.4 kg)  04/08/18 121 lb 3.2 oz (55 kg)      Other studies Reviewed: Echocardiogram 2015-06-08.  Left ventricle: The cavity size was normal. Systolic function was   normal. The estimated ejection fraction was in the range of 60%   to 65%. Wall motion was normal; there were no regional wall   motion abnormalities. - Aortic valve: There was trivial  regurgitation. - Mitral valve: Calcified annulus. There was severe regurgitation. - Left atrium: The atrium was moderately to severely dilated. - Right atrium: The atrium was moderately to severely dilated. - Tricuspid valve: There was severe regurgitation. - Pulmonary arteries: Systolic pressure was moderately to severely   increased. - Pericardium, extracardiac: There was a right pleural effusion.   There was a left pleural effusion.  ASSESSMENT AND PLAN:  1.  Labile BP: I will change her regimine a bit, by having her take amiodarone in  the pm (she is taking it at noon), continue metoprolol in the am. She will take lasix 40 mg in the am and 20 mg in the pm.   2. Dyspnea: Maybe related to severe TR. With her age she would not be a candidate for a valve repair I will check CBC to evaluate for anemia with know hx of same on Xarelto.   3. Hypothyroidism: She is followed by PCP for medication adjustment.    Current medicines are reviewed at length with the patient today.    Labs/ tests ordered today include: CBC.   [name] MareKathryn M. Liborio NixonLawrence DNP, ANP, AACC   08/11/2018 4:56 PM    [facility] [address] Office [phone] Fax [phone]

## 2018-08-11 ENCOUNTER — Ambulatory Visit (INDEPENDENT_AMBULATORY_CARE_PROVIDER_SITE_OTHER): Payer: Medicare Other | Admitting: Adult Health

## 2018-08-11 ENCOUNTER — Encounter: Payer: Self-pay | Admitting: Adult Health

## 2018-08-11 VITALS — BP 176/64 | HR 65 | Ht 60.0 in | Wt 124.0 lb

## 2018-08-11 DIAGNOSIS — Z79899 Other long term (current) drug therapy: Secondary | ICD-10-CM | POA: Diagnosis not present

## 2018-08-11 DIAGNOSIS — D649 Anemia, unspecified: Secondary | ICD-10-CM

## 2018-08-11 DIAGNOSIS — R7989 Other specified abnormal findings of blood chemistry: Secondary | ICD-10-CM | POA: Diagnosis not present

## 2018-08-11 DIAGNOSIS — I10 Essential (primary) hypertension: Secondary | ICD-10-CM | POA: Diagnosis not present

## 2018-08-11 DIAGNOSIS — I361 Nonrheumatic tricuspid (valve) insufficiency: Secondary | ICD-10-CM

## 2018-08-11 DIAGNOSIS — I480 Paroxysmal atrial fibrillation: Secondary | ICD-10-CM

## 2018-08-11 NOTE — Patient Instructions (Signed)
Medication Instructions:  TAKE AMIODARONE ABOUT 6PM  LASIX 40MG -AM AND 20MG -PM  If you need a refill on your cardiac medications before your next appointment, please call your pharmacy.  Labwork: CBC TODAY HERE IN OUR OFFICE AT [facility]    Take the provided lab slips with you to the lab for your blood draw.    When you have your labs (blood work) drawn today and your tests are completely normal, you will receive your results only by MyChart Message (if you have MyChart) -OR-  A paper copy in the mail.  If you have any lab test that is abnormal or we need to change your treatment, we will call you to review these results.  Special Instructions: CONTINUE TO TAKE AND LOG YOU BP AND HEART-RATE  Follow-Up: You will need a follow up appointment on Tuesday 08-26-2018 AT 4PM WITH Joni Reining, DNP, AACC .  You may see Peter [place], MDKathryn Lyman Bishop, DNP, AACC  or one of the following Advanced Practice Providers on your designated Care Team:   Azalee Course, PA-C   Micah Flesher, PA-C   At [facility], you and your health needs are our priority.  As part of our continuing mission to provide you with exceptional heart care, we have created designated Provider Care Teams.  These Care Teams include your primary Cardiologist (physician) and Advanced Practice Providers (APPs -  Physician Assistants and Nurse Practitioners) who all work together to provide you with the care you need, when you need it.  Thank you for choosing [facility] at [facility]!!

## 2018-08-12 LAB — CBC
Hematocrit: 31 % — ABNORMAL LOW (ref 34.0–46.6)
Hemoglobin: 10.2 g/dL — ABNORMAL LOW (ref 11.1–15.9)
MCH: 29.7 pg (ref 26.6–33.0)
MCHC: 32.9 g/dL (ref 31.5–35.7)
MCV: 90 fL (ref 79–97)
Platelets: 215 10*3/uL (ref 150–450)
RBC: 3.43 x10E6/uL — ABNORMAL LOW (ref 3.77–5.28)
RDW: 12.2 % (ref 11.7–15.4)
WBC: 7.4 10*3/uL (ref 3.4–10.8)

## 2018-08-13 ENCOUNTER — Telehealth: Payer: Self-pay

## 2018-08-13 NOTE — Telephone Encounter (Signed)
ON LAST PHONE CALL PT REQUESTED XARELTO SAMPLES, SAMPLES ARE @FRONT  DESK FOR HER TO PICK UP AT HER CONVENIENCE.

## 2018-08-26 ENCOUNTER — Encounter: Payer: Self-pay | Admitting: Adult Health

## 2018-08-26 ENCOUNTER — Ambulatory Visit (INDEPENDENT_AMBULATORY_CARE_PROVIDER_SITE_OTHER): Payer: Medicare Other | Admitting: Adult Health

## 2018-08-26 VITALS — BP 158/65 | HR 62 | Wt 124.4 lb

## 2018-08-26 DIAGNOSIS — Z7901 Long term (current) use of anticoagulants: Secondary | ICD-10-CM | POA: Diagnosis not present

## 2018-08-26 DIAGNOSIS — I5032 Chronic diastolic (congestive) heart failure: Secondary | ICD-10-CM | POA: Diagnosis not present

## 2018-08-26 DIAGNOSIS — I361 Nonrheumatic tricuspid (valve) insufficiency: Secondary | ICD-10-CM | POA: Diagnosis not present

## 2018-08-26 DIAGNOSIS — I480 Paroxysmal atrial fibrillation: Secondary | ICD-10-CM | POA: Diagnosis not present

## 2018-08-26 MED ORDER — FUROSEMIDE 40 MG PO TABS: 40.0000 mg | ORAL_TABLET | Freq: Two times a day (BID) | ORAL | 1 refills | Status: DC

## 2018-08-26 NOTE — Progress Notes (Signed)
Cardiology Office Note   Date:  08/26/2018   ID:  [name] Jones, DOB 1924/05/28, MRN [id number]  PCP:  Burton Apleyoberts, Ronald, MD  Cardiologist:  [place]   Chief Complaint  Patient presents with  . Follow-up    C/O BP ISSUE  . Hypertension     History of Present Illness: [name] Jones is a 85 y.o. female who presents for ongoing assessment and management of hypertension, atrial fib, diastolic CHF, severe MR and TR and moderate biatrial enlargement. She had a DCCV in 08/2015 and was noted to be bradycardic with HR in the 40's and therefore, metoprolol and diltiazem doses were reduced. She has other history of anxiety and take Xanax on occasion.  On last office visit she was complaining of labile BP. I changed her amiodarone to pm, metoprolol in the am and lasix to 40 mg in the am and 20 mg in the pm. She has severe TR, with her age, she is not a candidate for valve repair. CBC was ordered to evaluate for anemia causing dyspnea.   She comes today feeling better. Her BP record has normal fluctuations with some elevations but also some low readings. With Atrial fib, she may have some in accurate readings. BP average is 140's systolic over 70's.   She is having some nose bleeds at times. She was taking some Ayr gel for her nares, but has gotten out of the habit.   Past Medical History:  Diagnosis Date  . Atrial fibrillation (HCC)   . Chronic diastolic CHF (congestive heart failure) (HCC)   . Glaucoma   . Hypertension   . Hyponatremia   . Iron deficiency anemia   . Mitral insufficiency   . TIA (transient ischemic attack) 1997  . Tricuspid insufficiency     Past Surgical History:  Procedure Laterality Date  . ABDOMINAL HYSTERECTOMY    . CARDIOVERSION N/A 09/15/2015   Procedure: CARDIOVERSION;  Surgeon: Wendall StadePeter C Nishan, MD;  [facility];  Service: Cardiovascular;  Laterality: N/A;  . CATARACT EXTRACTION    . DIAGNOSTIC LAPAROSCOPY    . STRABISMUS SURGERY    . TONSILLECTOMY        Current Outpatient Medications  Medication Sig Dispense Refill  . acetaminophen (TYLENOL) 325 MG tablet Take 650 mg by mouth at bedtime as needed for moderate pain.     Marland Kitchen. ALPRAZolam (XANAX) 0.25 MG tablet Take 1/2 tablet daily if needed 30 tablet 5  . amiodarone (PACERONE) 200 MG tablet TAKE 1 TABLET BY MOUTH EVERY DAY 90 tablet 1  . calcium carbonate (CALTRATE 600) 1500 (600 Ca) MG TABS tablet Take 600 mg daily 30 tablet 6  . carteolol (OCUPRESS) 1 % ophthalmic solution Place 1 drop into both eyes 2 (two) times daily.    . cholecalciferol (VITAMIN D) 1000 units tablet Take 1 tablet (1,000 Units total) by mouth daily. 30 tablet 6  . KLOR-CON M10 10 MEQ tablet TAKE 1 TABLET (10 MEQ TOTAL) BY MOUTH 2 (TWO) TIMES DAILY. 180 tablet 1  . latanoprost (XALATAN) 0.005 % ophthalmic solution Place 1 drop into both eyes at bedtime.    Marland Kitchen. levothyroxine (SYNTHROID) 50 MCG tablet Take 1 tablet (50 mcg total) by mouth daily before breakfast. 30 tablet 6  . Menthol (ICY HOT) 5 % PTCH Apply 1 patch topically every morning. Apply to left knee at 8AM and removed qhs    . metoprolol succinate (TOPROL-XL) 25 MG 24 hr tablet TAKE 1 TABLET BY MOUTH EVERY DAY 90 tablet  3  . Multiple Vitamin (MULTIVITAMIN WITH MINERALS) TABS tablet Take 1 tablet by mouth every evening.     . Probiotic Product (RA PROBIOTIC COLON CARE) CAPS Take 1 daily 30 capsule 3  . Rivaroxaban (XARELTO) 15 MG TABS tablet Take 1 tablet (15 mg total) by mouth daily with supper. Lot [id number]  EXP 4/21 14 tablet 0  . Saccharomyces boulardii (PROBIOTIC) 250 MG CAPS Take 1 capsule by mouth daily for 30 days, THEN 1 capsule daily. 30 capsule 6  . vitamin C (ASCORBIC ACID) 500 MG tablet Take 500 mg by mouth daily with lunch.     . furosemide (LASIX) 40 MG tablet Take 1 tablet (40 mg total) by mouth 2 (two) times daily. 40MG =AM AND 20MG =PM 180 tablet 1   No current facility-administered medications for this visit.     Allergies:   Lisinopril     Social History:  The patient  reports that she has never smoked. She has never used smokeless tobacco. She reports current alcohol use. She reports that she does not use drugs.   Family History:  The patient's family history includes Heart attack in her brother; Heart attack (age of onset: 94) in her son; Heart attack (age of onset: 65) in her father; Heart disease in her mother; Hypertension in her son; Ovarian cancer (age of onset: 79) in her mother.    ROS: All other systems are reviewed and negative. Unless otherwise mentioned in H&P    PHYSICAL EXAM: VS:  BP (!) 158/65 (BP Location: Left Arm, Patient Position: Sitting, Cuff Size: Normal)   Pulse 62   Wt 124 lb 6.4 oz (56.4 kg)   BMI 24.30 kg/m  , BMI Body mass index is 24.3 kg/m. GEN: Well nourished, well developed, in no acute distress HEENT: normal Neck: no JVD, carotid bruits, or masses Cardiac: IRRR; 2/6 systolic murmurs, rubs, or gallops,no edema  Respiratory:  Clear to auscultation bilaterally, normal work of breathing GI: soft, nontender, nondistended, + BS MS: no deformity or atrophy Skin: warm and dry, no rash Neuro:  Strength and sensation are intact Psych: euthymic mood, full affect   EKG: Not completed this office visit.   Recent Labs: 04/08/2018: ALT 18; BUN 17; Creatinine, Ser 1.00; Potassium 4.6; Sodium 136 05/12/2018: TSH 5.650 08/11/2018: Hemoglobin 10.2; Platelets 215    Lipid Panel    Component Value Date/Time   CHOL 206 (H) 04/08/2018 1523   TRIG 158 (H) 04/08/2018 1523   HDL 79 04/08/2018 1523   CHOLHDL 3.0 06/08/2015 2322   VLDL 15 06/08/2015 2322   LDLCALC 95 04/08/2018 1523      Wt Readings from Last 3 Encounters:  08/26/18 124 lb 6.4 oz (56.4 kg)  08/11/18 124 lb (56.2 kg)  07/08/18 124 lb 4 oz (56.4 kg)      Other studies Reviewed: Echocardiogram June 06, 2015.  Left ventricle: The cavity size was normal. Systolic function was normal. The estimated ejection fraction was in the  range of 60% to 65%. Wall motion was normal; there were no regional wall motion abnormalities. - Aortic valve: There was trivial regurgitation. - Mitral valve: Calcified annulus. There was severe regurgitation. - Left atrium: The atrium was moderately to severely dilated. - Right atrium: The atrium was moderately to severely dilated. - Tricuspid valve: There was severe regurgitation. - Pulmonary arteries: Systolic pressure was moderately to severely increased. - Pericardium, extracardiac: There was a right pleural effusion. There was a left pleural effusion.  ASSESSMENT AND PLAN:  1. Hypertension: Overall  her average BP per home recordings is in the 140/70's I will not make any changes to allow for some permissive hypertension due to her age. She is asymptomatic currently.   2. Atrial fib: Rate is controlled. Continues on Xarelto 15 mg BID. She is given samples. Some episodes of epistaxis at times. She is advised to use Ayr nasal get routinely to help to avoid these events.She can also use a humidifier in her home as well.   3. Mitral and Tricuspid Valve regurg-Severe: Due to age, she is not a candidate for repair.Continue medical management with fluid management and BP control.    Current medicines are reviewed at length with the patient today.    Labs/ tests ordered today include: None  Bettey Mare. Liborio Nixon, ANP, AACC   08/26/2018 5:00 PM    [facility] [address] Office [phone] Fax [phone]

## 2018-08-26 NOTE — Patient Instructions (Signed)
Follow-Up: You will need a follow up appointment-KEEP SCHEDULED MARCH APPOINTMENT.     Medication Instructions:  NO CHANGES- Your physician recommends that you continue on your current medications as directed. Please refer to the Current Medication list given to you today. If you need a refill on your cardiac medications before your next appointment, please call your pharmacy. Labwork: When you have labs (blood work) and your tests are completely normal, you will receive your results ONLY by MyChart Message (if you have MyChart) -OR- A paper copy in the mail.  At [facility], you and your health needs are our priority.  As part of our continuing mission to provide you with exceptional heart care, we have created designated Provider Care Teams.  These Care Teams include your primary Cardiologist (physician) and Advanced Practice Providers (APPs -  Physician Assistants and Nurse Practitioners) who all work together to provide you with the care you need, when you need it.  Thank you for choosing [facility] at [facility]!!

## 2018-09-22 ENCOUNTER — Telehealth: Payer: Self-pay | Admitting: Cardiology

## 2018-09-22 NOTE — Telephone Encounter (Signed)
Patient calling the office for samples of medication: ° ° °1.  What medication and dosage are you requesting samples for? Xarelto ° °2.  Are you currently out of this medication? 3 left  ° ° ° °

## 2018-09-22 NOTE — Telephone Encounter (Signed)
Returned call to patient Xarelto 15 mg samples left at [facility] office front desk. 

## 2018-10-01 LAB — HEPATIC FUNCTION PANEL
ALT: 17 IU/L (ref 0–32)
AST: 21 IU/L (ref 0–40)
Albumin: 4.7 g/dL — ABNORMAL HIGH (ref 3.5–4.6)
Alkaline Phosphatase: 89 IU/L (ref 39–117)
Bilirubin Total: 0.3 mg/dL (ref 0.0–1.2)
Bilirubin, Direct: 0.1 mg/dL (ref 0.00–0.40)
Total Protein: 6.3 g/dL (ref 6.0–8.5)

## 2018-10-01 LAB — BASIC METABOLIC PANEL
BUN/Creatinine Ratio: 18 (ref 12–28)
BUN: 19 mg/dL (ref 10–36)
CO2: 20 mmol/L (ref 20–29)
Calcium: 9 mg/dL (ref 8.7–10.3)
Chloride: 94 mmol/L — ABNORMAL LOW (ref 96–106)
Creatinine, Ser: 1.05 mg/dL — ABNORMAL HIGH (ref 0.57–1.00)
GFR calc Af Amer: 53 mL/min/{1.73_m2} — ABNORMAL LOW (ref >59)
GFR calc non Af Amer: 46 mL/min/{1.73_m2} — ABNORMAL LOW (ref >59)
Glucose: 93 mg/dL (ref 65–99)
Potassium: 4.2 mmol/L (ref 3.5–5.2)
Sodium: 136 mmol/L (ref 134–144)

## 2018-10-01 LAB — LIPID PANEL
Chol/HDL Ratio: 2.6 ratio (ref 0.0–4.4)
Cholesterol, Total: 192 mg/dL (ref 100–199)
HDL: 73 mg/dL (ref >39)
LDL Calculated: 95 mg/dL (ref 0–99)
Triglycerides: 121 mg/dL (ref 0–149)
VLDL Cholesterol Cal: 24 mg/dL (ref 5–40)

## 2018-10-01 LAB — TSH: TSH: 1.9 u[IU]/mL (ref 0.450–4.500)

## 2018-10-01 LAB — T4, FREE: Free T4: 1.9 ng/dL — ABNORMAL HIGH (ref 0.82–1.77)

## 2018-10-03 ENCOUNTER — Telehealth: Payer: Self-pay | Admitting: Cardiology

## 2018-10-03 NOTE — Telephone Encounter (Signed)
  Daughter would like to speak to [facility] regarding Ms Deist's appointment Monday with the coronovirus. She would like to talk to [place] before cancelling appt. Please call back after lunch because she is going to a funeral right now.

## 2018-10-03 NOTE — Telephone Encounter (Signed)
Returned call to patient's daughter Eber Jones.She stated mother has appointment with Dr.Jordan this Mon 3/16.Stated she is doing good and she would like to reschedule due to the coronavirus.Appointment rescheduled to 12/10/18 at 11:40 am.Advised to call sooner if needed.

## 2018-10-06 ENCOUNTER — Ambulatory Visit: Payer: Medicare Other | Admitting: Cardiology

## 2018-10-09 ENCOUNTER — Other Ambulatory Visit: Payer: Self-pay | Admitting: Cardiology

## 2018-10-17 ENCOUNTER — Other Ambulatory Visit: Payer: Self-pay | Admitting: Pharmacist Clinician (PhC)/ Clinical Pharmacy Specialist

## 2018-10-17 MED ORDER — RIVAROXABAN 15 MG PO TABS: 15.0000 mg | ORAL_TABLET | Freq: Every day | ORAL | 1 refills | Status: DC

## 2018-10-20 ENCOUNTER — Other Ambulatory Visit: Payer: Self-pay

## 2018-10-20 MED ORDER — RIVAROXABAN 15 MG PO TABS: 15.0000 mg | ORAL_TABLET | Freq: Every day | ORAL | 2 refills | Status: DC

## 2018-10-21 ENCOUNTER — Telehealth: Payer: Self-pay | Admitting: Cardiology

## 2018-10-21 NOTE — Telephone Encounter (Signed)
Returned call to patient no answer.No voice mail.Called patient's daughter Eber Jones left message on personal voice mail Xarelto 15 mg samples left at [facility] office front desk.

## 2018-10-21 NOTE — Telephone Encounter (Signed)
Patient calling the office for samples of medication: ° ° °1.  What medication and dosage are you requesting samples for? Xarelto ° °2.  Are you currently out of this medication?  A few ° ° ° °

## 2018-11-20 ENCOUNTER — Telehealth: Payer: Self-pay | Admitting: Cardiology

## 2018-11-20 MED ORDER — RIVAROXABAN 15 MG PO TABS: 15.0000 mg | ORAL_TABLET | Freq: Every day | ORAL | 2 refills | Status: DC

## 2018-11-20 NOTE — Telephone Encounter (Signed)
Script sent to pharmacy no samples available  At this time./cy

## 2018-11-20 NOTE — Telephone Encounter (Signed)
New Message    Patient calling the office for samples of medication:   1.  What medication and dosage are you requesting samples for? Xarelto 15 mg   2.  Are you currently out of this medication?  Will be Tuesday

## 2018-11-24 ENCOUNTER — Telehealth: Payer: Self-pay | Admitting: Cardiology

## 2018-11-24 NOTE — Telephone Encounter (Signed)
Medication Samples available for pick up at front desk. Also provided patient assistance form and coupons for additional supply. Pt aware and states will pick up tomorrow  Drug name: XARELTO       Strength: 15 MG        Qty: 2 BOTTLES  LOT: [id number]  Exp.Date: 03/22

## 2018-11-24 NOTE — Telephone Encounter (Signed)
Patient calling the office for samples of medication:   1.  What medication and dosage are you requesting samples for? Rivaroxaban (XARELTO) 15 MG TABS tablet  2.  Are you currently out of this medication? Has 3 left.

## 2018-12-07 NOTE — Progress Notes (Signed)
Virtual Visit via Telephone Note   This visit type was conducted due to national recommendations for restrictions regarding the COVID-19 Pandemic (e.g. social distancing) in an effort to limit this patient's exposure and mitigate transmission in our community.  Due to her co-morbid illnesses, this patient is at least at moderate risk for complications without adequate follow up.  This format is felt to be most appropriate for this patient at this time.  The patient did not have access to video technology/had technical difficulties with video requiring transitioning to audio format only (telephone).  All issues noted in this document were discussed and addressed.  No physical exam could be performed with this format.  Please refer to the patient's chart for her  consent to telehealth for [facility].   Date:  12/10/2018   ID:  [name] Jones, [name] Jones [birth year], MRN [id number]  Patient Location: Home Provider Location: Office  PCP:  Burton Apley, MD  Cardiologist:  Kinze Labo [place], MD  Electrophysiologist:  None   Evaluation Performed:  Follow-Up Visit  Chief Complaint:  HTN  History of Present Illness:    [name] Jones is a 85 y.o. female seen for follow up HTN and AFib. She has a history of HTN. In early November 2016 she was noted to have new onset atrial fibrillation. She was started on Xarelto. She deteriorated and was admitted with acute diastolic CHF. She was diuresed with IV lasix. She had hyponatremia. She had an Echo in May 2016 while in NSR showing Normal LV function with grade 2 diastolic dysfunction. There was moderate MR and mild to moderate TR. Mild pulmonary HTN. Repeat Echo in hospital showed normal LV function with severe MR and TR and mod to severe biatrial enlargement. She later underwent DCCV on 09/15/15. On follow up she was noted to be bradycardic with HR into low 40s. Her metoprolol and cardizem doses were reduced.    She does have labile BP readings.   On  follow up today she notes BP is still up and down. Sometimes diastolic readings will drop to the upper 30s to 40s. She has no symptoms with this and it comes up with activity. Her lasix was reduced in February and she notes no increase in edema. Weight is stable. She has chronic SOB on exertion. No chest pain. Rare palpitations.   The patient does not have symptoms concerning for COVID-19 infection (fever, chills, cough, or new shortness of breath).    Past Medical History:  Diagnosis Date  . Atrial fibrillation (HCC)   . Chronic diastolic CHF (congestive heart failure) (HCC)   . Glaucoma   . Hypertension   . Hyponatremia   . Iron deficiency anemia   . Mitral insufficiency   . TIA (transient ischemic attack) 1997  . Tricuspid insufficiency    Past Surgical History:  Procedure Laterality Date  . ABDOMINAL HYSTERECTOMY    . CARDIOVERSION N/A 09/15/2015   Procedure: CARDIOVERSION;  Surgeon: Wendall Stade, MD;  Location: [facility] ENDOSCOPY;  Service: Cardiovascular;  Laterality: N/A;  . CATARACT EXTRACTION    . DIAGNOSTIC LAPAROSCOPY    . STRABISMUS SURGERY    . TONSILLECTOMY       Current Meds  Medication Sig  . acetaminophen (TYLENOL) 325 MG tablet Take 650 mg by mouth at bedtime as needed for moderate pain.   Marland Kitchen ALPRAZolam (XANAX) 0.25 MG tablet Take 1/2 tablet daily if needed  . amiodarone (PACERONE) 200 MG tablet TAKE 1 TABLET BY MOUTH EVERY DAY  .  calcium carbonate (CALTRATE 600) 1500 (600 Ca) MG TABS tablet Take 600 mg daily  . carteolol (OCUPRESS) 1 % ophthalmic solution Place 1 drop into both eyes 2 (two) times daily.  . cholecalciferol (VITAMIN D) 1000 units tablet Take 1 tablet (1,000 Units total) by mouth daily.  . furosemide (LASIX) 40 MG tablet Take 1 tablet (40 mg total) by mouth 2 (two) times daily. 40MG =AM AND 20MG =PM  . KLOR-CON M10 10 MEQ tablet TAKE 1 TABLET (10 MEQ TOTAL) BY MOUTH 2 (TWO) TIMES DAILY.  Marland Kitchen. latanoprost (XALATAN) 0.005 % ophthalmic solution Place 1 drop  into both eyes at bedtime.  . Menthol (ICY HOT) 5 % PTCH Apply 1 patch topically every morning. Apply to left knee at 8AM and removed qhs  . metoprolol succinate (TOPROL-XL) 25 MG 24 hr tablet TAKE 1 TABLET BY MOUTH EVERY DAY  . Multiple Vitamin (MULTIVITAMIN WITH MINERALS) TABS tablet Take 1 tablet by mouth every evening.   . Probiotic Product (RA PROBIOTIC COLON CARE) CAPS Take 1 daily  . Rivaroxaban (XARELTO) 15 MG TABS tablet Take 1 tablet (15 mg total) by mouth daily with supper.  . vitamin C (ASCORBIC ACID) 500 MG tablet Take 500 mg by mouth daily with lunch.   . [DISCONTINUED] Rivaroxaban (XARELTO) 15 MG TABS tablet Take 1 tablet (15 mg total) by mouth daily with supper.     Allergies:   Lisinopril   Social History   Tobacco Use  . Smoking status: Never Smoker  . Smokeless tobacco: Never Used  Substance Use Topics  . Alcohol use: Yes    Alcohol/week: 0.0 standard drinks    Comment: occasional  . Drug use: No     Family Hx: The patient's family history includes Heart attack in her brother; Heart attack (age of onset: 4952) in her son; Heart attack (age of onset: 2776) in her father; Heart disease in her mother; Hypertension in her son; Ovarian cancer (age of onset: 3752) in her mother.  ROS:   Please see the history of present illness.    All other systems reviewed and are negative.   Prior CV studies:   The following studies were reviewed today:  none  Labs/Other Tests and Data Reviewed:    EKG:  No ECG reviewed.  Recent Labs: 08/11/2018: Hemoglobin 10.2; Platelets 215 09/29/2018: ALT 17; BUN 19; Creatinine, Ser 1.05; Potassium 4.2; Sodium 136; TSH 1.900   Recent Lipid Panel Lab Results  Component Value Date/Time   CHOL 192 09/29/2018 10:40 AM   TRIG 121 09/29/2018 10:40 AM   HDL 73 09/29/2018 10:40 AM   CHOLHDL 2.6 09/29/2018 10:40 AM   CHOLHDL 3.0 06/08/2015 11:22 PM   LDLCALC 95 09/29/2018 10:40 AM    Wt Readings from Last 3 Encounters:  12/10/18 124 lb 3.2  oz (56.3 kg)  08/26/18 124 lb 6.4 oz (56.4 kg)  08/11/18 124 lb (56.2 kg)     Objective:    Vital Signs:  BP (!) 151/49   Pulse (!) 49   Temp 97.9 F (36.6 C)   Ht 5' (1.524 m)   Wt 124 lb 3.2 oz (56.3 kg)   BMI 24.26 kg/m    VITAL SIGNS:  reviewed  ASSESSMENT & PLAN:    1.  Atrial fibrillation s/p DCCV in February 2017. Maintaining NSR on amiodarone. Will continue current therapy.  Continue metoprolol and Xarelto. Labs OK in March.  2. Chronic diastolic CHF. Exacerbated by Afib. Volume status appears to be OK on lower lasix dose.  3. Hyponatremia. Resolved.   4. Moderate to severe MR and TR.   Will treat medically. She is not a candidate for valve surgery.   5. Iron deficiency anemia. Last Hgb 10.2.   6. HTN- labile. Some low diastolic readings but asymptomatic. Will observe.     COVID-19 Education: The signs and symptoms of COVID-19 were discussed with the patient and how to seek care for testing (follow up with PCP or arrange E-visit).  The importance of social distancing was discussed today.  Time:   Today, I have spent 15 minutes with the patient with telehealth technology discussing the above problems.     Medication Adjustments/Labs and Tests Ordered: Current medicines are reviewed at length with the patient today.  Concerns regarding medicines are outlined above.   Tests Ordered: No orders of the defined types were placed in this encounter.   Medication Changes: Meds ordered this encounter  Medications  . Rivaroxaban (XARELTO) 15 MG TABS tablet    Sig: Take 1 tablet (15 mg total) by mouth daily with supper.    Dispense:  90 tablet    Refill:  2    Lot 18mg [id number]  Exp 7/21 x2    Disposition:  Follow up in 6 month(s)  Signed, Ceferino Lang [place], MD  12/10/2018 1:40 PM    [facility]

## 2018-12-08 ENCOUNTER — Telehealth: Payer: Self-pay | Admitting: Cardiology

## 2018-12-08 NOTE — Telephone Encounter (Signed)
Spoke to patient's daughter Eber Jones.Stated she will be emailing mother's B/P readings for Dr.Jordan to review 12/10/18.Stated she wanted to make Dr.Jordan aware her mother may be adjusting her medications on her own.Also she is concerned her brother lives in her home and he does not wear a mask.She wanted Dr.Jordan to advise anyone living there needs to wear a mask and follow Covid guidelines.Advised I will make Dr.Jordan aware.

## 2018-12-08 NOTE — Telephone Encounter (Signed)
New Message:   [name] Jones would like to talk to [facility] before pt's appt on Wednesday.

## 2018-12-09 ENCOUNTER — Telehealth: Payer: Self-pay | Admitting: Cardiology

## 2018-12-10 ENCOUNTER — Telehealth (INDEPENDENT_AMBULATORY_CARE_PROVIDER_SITE_OTHER): Payer: Medicare Other | Admitting: Cardiology

## 2018-12-10 ENCOUNTER — Encounter: Payer: Self-pay | Admitting: Cardiology

## 2018-12-10 VITALS — BP 151/49 | HR 49 | Temp 97.9°F | Ht 60.0 in | Wt 124.2 lb

## 2018-12-10 DIAGNOSIS — I10 Essential (primary) hypertension: Secondary | ICD-10-CM

## 2018-12-10 DIAGNOSIS — I480 Paroxysmal atrial fibrillation: Secondary | ICD-10-CM

## 2018-12-10 DIAGNOSIS — I5032 Chronic diastolic (congestive) heart failure: Secondary | ICD-10-CM

## 2018-12-10 MED ORDER — RIVAROXABAN 15 MG PO TABS: 15.0000 mg | ORAL_TABLET | Freq: Every day | ORAL | 2 refills | Status: DC

## 2018-12-10 NOTE — Patient Instructions (Signed)
Continue your current therapy  Follow up in 6 months   

## 2018-12-11 NOTE — Telephone Encounter (Signed)
Spoke to daughter [name] Jones she stated she wants mother to continue checking B/P twice a day.Stated her B/P is up and down and she has noticed swings in her B/P readings.Also she has long term care insurance and they require daily B/P checks.Spoke to patient advised to continue checking B/P twice a day.Advised to send readings in 2 weeks for Dr.Jordan to review.

## 2019-01-20 NOTE — Telephone Encounter (Signed)
Opened in error

## 2019-02-01 ENCOUNTER — Other Ambulatory Visit: Payer: Self-pay | Admitting: Cardiology

## 2019-02-01 DIAGNOSIS — I5032 Chronic diastolic (congestive) heart failure: Secondary | ICD-10-CM

## 2019-02-01 DIAGNOSIS — I480 Paroxysmal atrial fibrillation: Secondary | ICD-10-CM

## 2019-03-02 ENCOUNTER — Other Ambulatory Visit: Payer: Self-pay | Admitting: Cardiology

## 2019-05-15 ENCOUNTER — Other Ambulatory Visit: Payer: Self-pay

## 2019-05-15 DIAGNOSIS — I480 Paroxysmal atrial fibrillation: Secondary | ICD-10-CM

## 2019-05-15 DIAGNOSIS — I5032 Chronic diastolic (congestive) heart failure: Secondary | ICD-10-CM

## 2019-05-15 MED ORDER — FUROSEMIDE 40 MG PO TABS: 40.0000 mg | ORAL_TABLET | Freq: Two times a day (BID) | ORAL | 1 refills | Status: DC

## 2019-05-22 ENCOUNTER — Other Ambulatory Visit: Payer: Self-pay

## 2019-05-22 DIAGNOSIS — I5032 Chronic diastolic (congestive) heart failure: Secondary | ICD-10-CM

## 2019-05-22 DIAGNOSIS — I480 Paroxysmal atrial fibrillation: Secondary | ICD-10-CM

## 2019-05-22 MED ORDER — AMIODARONE HCL 200 MG PO TABS: 200.0000 mg | ORAL_TABLET | Freq: Every day | ORAL | 1 refills | Status: DC

## 2019-06-14 NOTE — Progress Notes (Signed)
Date:  06/17/2019   ID:  [name], [name] September 17, 1923, MRN [id number]  PCP:  Burton Apley, MD  Cardiologist:  Peter [place], MD  Electrophysiologist:  None   Evaluation Performed:  Follow-Up Visit  Chief Complaint:  HTN,Afib   History of Present Illness:    [name] Jones is a 85 y.o. female seen for follow up HTN and AFib. She has a history of HTN. In early November 2016 she was noted to have new onset atrial fibrillation. She was started on Xarelto. She deteriorated and was admitted with acute diastolic CHF. She was diuresed with IV lasix. She had hyponatremia. She had an Echo in May 2016 while in NSR showing Normal LV function with grade 2 diastolic dysfunction. There was moderate MR and mild to moderate TR. Mild pulmonary HTN. Repeat Echo in hospital showed normal LV function with severe MR and TR and mod to severe biatrial enlargement. She later underwent DCCV on 09/15/15. On follow up she was noted to be bradycardic with HR into low 40s. Her metoprolol and cardizem doses were reduced. Later cardizem discontinued.   On follow up today she is doing OK. She does complain of fatigue and feeling sleepy. Energy level is poor. She does have dyspnea on exertion that resolves with rest. No edema. Rare palpitations. Brings recording of BP readings. Systolic 129-151, diastolic 42-44. Pulse rate typically in the 40s.      Past Medical History:  Diagnosis Date  . Atrial fibrillation (HCC)   . Chronic diastolic CHF (congestive heart failure) (HCC)   . Glaucoma   . Hypertension   . Hyponatremia   . Iron deficiency anemia   . Mitral insufficiency   . TIA (transient ischemic attack) 1997  . Tricuspid insufficiency    Past Surgical History:  Procedure Laterality Date  . ABDOMINAL HYSTERECTOMY    . CARDIOVERSION N/A 09/15/2015   Procedure: CARDIOVERSION;  Surgeon: Wendall Stade, MD;  Location[facility] ENDOSCOPY;  Service: Cardiovascular;  Laterality: N/A;  . CATARACT EXTRACTION     . DIAGNOSTIC LAPAROSCOPY    . STRABISMUS SURGERY    . TONSILLECTOMY       Current Meds  Medication Sig  . acetaminophen (TYLENOL) 325 MG tablet Take 650 mg by mouth at bedtime as needed for moderate pain.   Marland Kitchen ALPRAZolam (XANAX) 0.25 MG tablet Take 1/2 tablet daily if needed  . amiodarone (PACERONE) 200 MG tablet Take 0.5 tablets (100 mg total) by mouth daily.  . calcium carbonate (CALTRATE 600) 1500 (600 Ca) MG TABS tablet Take 600 mg daily  . carteolol (OCUPRESS) 1 % ophthalmic solution Place 1 drop into both eyes 2 (two) times daily.  . cholecalciferol (VITAMIN D) 1000 units tablet Take 1 tablet (1,000 Units total) by mouth daily.  . furosemide (LASIX) 20 MG tablet Take by mouth. Pt takes 20 mg at lunch time and 40 mg at bedtime  . KLOR-CON M10 10 MEQ tablet TAKE 1 TABLET (10 MEQ TOTAL) BY MOUTH 2 (TWO) TIMES DAILY.  Marland Kitchen latanoprost (XALATAN) 0.005 % ophthalmic solution Place 1 drop into both eyes at bedtime.  Marland Kitchen levothyroxine (SYNTHROID) 50 MCG tablet Take 1 tablet (50 mcg total) by mouth daily before breakfast.  . Menthol (ICY HOT) 5 % PTCH Apply 1 patch topically every morning. Apply to left knee at 8AM and removed qhs  . Multiple Vitamin (MULTIVITAMIN WITH MINERALS) TABS tablet Take 1 tablet by mouth every evening.   . Probiotic Product (RA PROBIOTIC COLON CARE)  CAPS Take 1 daily  . Rivaroxaban (XARELTO) 15 MG TABS tablet Take 1 tablet (15 mg total) by mouth daily with supper.  . vitamin C (ASCORBIC ACID) 500 MG tablet Take 500 mg by mouth daily with lunch.   . [DISCONTINUED] amiodarone (PACERONE) 200 MG tablet Take 1 tablet (200 mg total) by mouth daily.  . [DISCONTINUED] metoprolol succinate (TOPROL-XL) 25 MG 24 hr tablet TAKE 1 TABLET BY MOUTH EVERY DAY     Allergies:   Lisinopril   Social History   Tobacco Use  . Smoking status: Never Smoker  . Smokeless tobacco: Never Used  Substance Use Topics  . Alcohol use: Yes    Alcohol/week: 0.0 standard drinks    Comment:  occasional  . Drug use: No     Family Hx: The patient's family history includes Heart attack in her brother; Heart attack (age of onset: 5552) in her son; Heart attack (age of onset: 4876) in her father; Heart disease in her mother; Hypertension in her son; Ovarian cancer (age of onset: 7952) in her mother.  ROS:   Please see the history of present illness.    All other systems reviewed and are negative.   Prior CV studies:   The following studies were reviewed today:  none  Labs/Other Tests and Data Reviewed:    EKG:  Ecg today shows sinus brady with rate 49. Otherwise normal. I have personally reviewed and interpreted this study.   Recent Labs: 08/11/2018: Hemoglobin 10.2; Platelets 215 09/29/2018: ALT 17; BUN 19; Creatinine, Ser 1.05; Potassium 4.2; Sodium 136; TSH 1.900   Recent Lipid Panel Lab Results  Component Value Date/Time   CHOL 192 09/29/2018 10:40 AM   TRIG 121 09/29/2018 10:40 AM   HDL 73 09/29/2018 10:40 AM   CHOLHDL 2.6 09/29/2018 10:40 AM   CHOLHDL 3.0 06/08/2015 11:22 PM   LDLCALC 95 09/29/2018 10:40 AM    Wt Readings from Last 3 Encounters:  06/17/19 125 lb 6.4 oz (56.9 kg)  12/10/18 124 lb 3.2 oz (56.3 kg)  08/26/18 124 lb 6.4 oz (56.4 kg)     Objective:    Vital Signs:  BP (!) 189/61   Pulse (!) 49   Ht 5' (1.524 m)   Wt 125 lb 6.4 oz (56.9 kg)   SpO2 99%   BMI 24.49 kg/m    GENERAL:  Well appearing, elderly WF in NAD HEENT:  PERRL, EOMI, sclera are clear. Oropharynx is clear. NECK:  No jugular venous distention, carotid upstroke brisk and symmetric, no bruits, no thyromegaly or adenopathy LUNGS:  Clear to auscultation bilaterally CHEST:  Unremarkable HEART:  RRR,  PMI not displaced or sustained,S1 and S2 within normal limits, no S3, no S4: no clicks, no rubs, no murmurs ABD:  Soft, nontender. BS +, no masses or bruits. No hepatomegaly, no splenomegaly EXT:  2 + pulses throughout, no edema, no cyanosis no clubbing SKIN:  Warm and dry.  No  rashes NEURO:  Alert and oriented x 3. Cranial nerves II through XII intact. PSYCH:  Cognitively intact    ASSESSMENT & PLAN:    1.  Atrial fibrillation s/p DCCV in February 2017. Maintaining NSR on amiodarone.  Given lack of recurrence and persistent bradycardia we will stop Toprol XL and reduce amiodarone to 100 mg daily.  Continue  Xarelto. Had labs recently with Dr Su Hiltoberts. Will request a copy.   2. Chronic diastolic CHF. Exacerbated by Afib. Volume status appears to be OK on current lasix dose.  3.  Hyponatremia. Resolved.   4. Moderate to severe MR and TR.   Will treat medically. She is not a candidate for valve surgery.   5. Iron deficiency anemia.   6. HTN- labile. BP readings at home have been good. Concerned that diastolic readings are low. Will stop Toprol XL.     Medication Adjustments/Labs and Tests Ordered: Current medicines are reviewed at length with the patient today.  Concerns regarding medicines are outlined above.   Tests Ordered: Orders Placed This Encounter  Procedures  . EKG 12-Lead    Medication Changes: Meds ordered this encounter  Medications  . amiodarone (PACERONE) 200 MG tablet    Sig: Take 0.5 tablets (100 mg total) by mouth daily.    Dispense:  45 tablet    Refill:  3    Disposition:  Follow up in 6 month(s)  Signed, Peter Martinique, MD  06/17/2019 11:52 AM    [facility]

## 2019-06-17 ENCOUNTER — Ambulatory Visit (INDEPENDENT_AMBULATORY_CARE_PROVIDER_SITE_OTHER): Payer: Medicare Other | Admitting: Cardiology

## 2019-06-17 ENCOUNTER — Other Ambulatory Visit: Payer: Self-pay

## 2019-06-17 ENCOUNTER — Encounter: Payer: Self-pay | Admitting: Cardiology

## 2019-06-17 VITALS — BP 189/61 | HR 49 | Ht 60.0 in | Wt 125.4 lb

## 2019-06-17 DIAGNOSIS — I10 Essential (primary) hypertension: Secondary | ICD-10-CM | POA: Diagnosis not present

## 2019-06-17 DIAGNOSIS — I5032 Chronic diastolic (congestive) heart failure: Secondary | ICD-10-CM

## 2019-06-17 DIAGNOSIS — I480 Paroxysmal atrial fibrillation: Secondary | ICD-10-CM

## 2019-06-17 DIAGNOSIS — Z7901 Long term (current) use of anticoagulants: Secondary | ICD-10-CM

## 2019-06-17 MED ORDER — AMIODARONE HCL 200 MG PO TABS: 100.0000 mg | ORAL_TABLET | Freq: Every day | ORAL | 3 refills | Status: DC

## 2019-06-17 NOTE — Patient Instructions (Signed)
Stop taking Toprol XL  Reduce amiodarone to 100 mg daily  Follow up in 6 months

## 2019-08-19 ENCOUNTER — Ambulatory Visit: Payer: Self-pay

## 2019-08-24 ENCOUNTER — Telehealth: Payer: Self-pay | Admitting: Cardiology

## 2019-08-24 DIAGNOSIS — I5032 Chronic diastolic (congestive) heart failure: Secondary | ICD-10-CM

## 2019-08-24 DIAGNOSIS — I480 Paroxysmal atrial fibrillation: Secondary | ICD-10-CM

## 2019-08-24 MED ORDER — AMIODARONE HCL 100 MG PO TABS: 100.0000 mg | ORAL_TABLET | Freq: Every day | ORAL | 3 refills | Status: DC

## 2019-08-24 NOTE — Telephone Encounter (Signed)
Gave pt samples and refilled amiodarone to 100mg  tablets per request.

## 2019-08-24 NOTE — Telephone Encounter (Signed)
Patient calling the office for samples of medication:   1.  What medication and dosage are you requesting samples for?  Rivaroxaban (XARELTO) 15 MG TABS tablet 2.  Are you currently out of this medication? Has about a week left.  Patient also states she would like her amiodarone (PACERONE) 200 MG tablet to be changed to 100mg s

## 2019-08-28 ENCOUNTER — Ambulatory Visit: Payer: Medicare Other | Attending: Internal Medicine

## 2019-08-28 DIAGNOSIS — Z23 Encounter for immunization: Secondary | ICD-10-CM

## 2019-08-28 NOTE — Progress Notes (Signed)
° °  Covid-19 Vaccination Clinic  Name:  [name] Jones    MRN: [id number] DOB: 08-05-1923  08/28/2019  [name] was observed post Covid-19 immunization for 15 minutes without incidence. She was provided with Vaccine Information Sheet and instruction to access the V-Safe system.   [name] was instructed to call 911 with any severe reactions post vaccine:  Difficulty breathing   Swelling of your face and throat   A fast heartbeat   A bad rash all over your body   Dizziness and weakness    Immunizations Administered    Name Date Dose VIS Date Route   Pfizer COVID-19 Vaccine 08/28/2019  9:08 AM 0.3 mL 07/03/2019 Intramuscular   Manufacturer: [employer], [employer]   Lot: [id number]   NDC: [address]-1

## 2019-09-22 ENCOUNTER — Ambulatory Visit: Payer: Medicare Other | Attending: Internal Medicine

## 2019-09-22 DIAGNOSIS — Z23 Encounter for immunization: Secondary | ICD-10-CM | POA: Insufficient documentation

## 2019-09-22 NOTE — Progress Notes (Signed)
   Covid-19 Vaccination Clinic  Name:  BREKYN HUNTOON    MRN: [id number] DOB: [birth year]  09/22/2019  Ms. Amesquita was observed post Covid-19 immunization for 15 minutes without incident. She was provided with Vaccine Information Sheet and instruction to access the V-Safe system.   Ms. Hunton was instructed to call 911 with any severe reactions post vaccine: Marland Kitchen Difficulty breathing  . Swelling of face and throat  . A fast heartbeat  . A bad rash all over body  . Dizziness and weakness   Immunizations Administered    Name Date Dose VIS Date Route   Pfizer COVID-19 Vaccine 09/22/2019  9:44 AM 0.3 mL 07/03/2019 Intramuscular   Manufacturer: [employer], [employer]   Lot: [id number]   NDC: [address]-1

## 2019-09-29 ENCOUNTER — Telehealth: Payer: Self-pay | Admitting: Cardiology

## 2019-09-29 DIAGNOSIS — I5032 Chronic diastolic (congestive) heart failure: Secondary | ICD-10-CM

## 2019-09-29 DIAGNOSIS — I10 Essential (primary) hypertension: Secondary | ICD-10-CM

## 2019-09-29 NOTE — Telephone Encounter (Signed)
Left a message with the patient's son to call back when she woke from her nap.

## 2019-09-29 NOTE — Telephone Encounter (Signed)
Patient is returning call.  °

## 2019-09-29 NOTE — Telephone Encounter (Signed)
Spoke with pt, she reports her metoprolol was stopped in November and she has noticed an increase in bp for the last 1-2 months. She has taken 1/2 of the metoprolol for the last 4 days and has noticed just a small drop in bp but no real change. She denies any other symptoms. Her furosemide is currently 40 mg in the am and 20 mg in the afternoon. Will forward to dr [place] to review and advise. Patient aware samples at the front desk for pick up.

## 2019-09-29 NOTE — Telephone Encounter (Signed)
Pt c/o BP issue: STAT if pt c/o blurred vision, one-sided weakness or slurred speech  1. What are your last 5 BP readings?  09/21/19: 170/63 09/22/19: 157/60 09/23/19: 169/63 09/24/19: 166/60 09/25/19: 168/64 09/26/19: 170/63 09/27/19: 160/59 09/28/19: 161/62 09/29/19: 161/55  2. Are you having any other symptoms (ex. Dizziness, headache, blurred vision, passed out)? Dull headache - she states she doesn't feel real but just doesn't feel right.   3. What is your BP issue? Elevated BP. Patient states that she has taken half a tablet of Metoprolol 25mg  since Sunday 09/27/19. She also states that she had her covid vaccines on 08/28/19 and 09/22/19. She noticed her BP started creeping up in February. Not sure if this is a cause.        Patient calling the office for samples of medication:   1.  What medication and dosage are you requesting samples for? Rivaroxaban (XARELTO) 15 MG TABS tablet  2.  Are you currently out of this medication? No but will need samples next week.

## 2019-09-30 NOTE — Telephone Encounter (Signed)
We stopped Toprol before due to bradycardia. She can stay on 1/2 dose but I would add losartan 50 mg daily for BP control. Check BMET in one week  Aman Batley [place] MD, [facility]

## 2019-10-01 MED ORDER — LOSARTAN POTASSIUM 50 MG PO TABS: 50.0000 mg | ORAL_TABLET | Freq: Every day | ORAL | 3 refills | Status: DC

## 2019-10-01 NOTE — Telephone Encounter (Signed)
Called patient no answer.No voice mail. 

## 2019-10-01 NOTE — Telephone Encounter (Signed)
Spoke to patient Dr.Jordan's advice given.Advised to have bmet in 1 week.Advised to continue to monitor B/P and call back if elevated.

## 2019-10-01 NOTE — Telephone Encounter (Signed)
Follow Up:      Returning your call from today. 

## 2019-10-05 ENCOUNTER — Other Ambulatory Visit: Payer: Self-pay | Admitting: Cardiology

## 2019-10-09 LAB — BASIC METABOLIC PANEL
BUN/Creatinine Ratio: 21 (ref 12–28)
BUN: 20 mg/dL (ref 10–36)
CO2: 22 mmol/L (ref 20–29)
Calcium: 9.5 mg/dL (ref 8.7–10.3)
Chloride: 94 mmol/L — ABNORMAL LOW (ref 96–106)
Creatinine, Ser: 0.96 mg/dL (ref 0.57–1.00)
GFR calc Af Amer: 59 mL/min/{1.73_m2} — ABNORMAL LOW (ref >59)
GFR calc non Af Amer: 51 mL/min/{1.73_m2} — ABNORMAL LOW (ref >59)
Glucose: 75 mg/dL (ref 65–99)
Potassium: 4.8 mmol/L (ref 3.5–5.2)
Sodium: 133 mmol/L — ABNORMAL LOW (ref 134–144)

## 2019-10-15 ENCOUNTER — Telehealth: Payer: Self-pay

## 2019-10-15 NOTE — Telephone Encounter (Signed)
Spoke to patient bmet results given.Stated she could not take Losartan caused nausea and dizziness.She stopped taking and increased Metoprolol back to 25 mg daily.B/P readings 145/47,151/52,145/44,166/59,149/48,157/52,145/47,150/72,153/48,145/52. Pulse [id number].Stated she feels better.Advised I will send message to Dr.Jordan.

## 2019-10-16 NOTE — Telephone Encounter (Signed)
OK will continue to monitor. BP acceptable.   Eshaan Titzer [place] MD, [facility]

## 2019-10-19 NOTE — Telephone Encounter (Signed)
Spoke to patient Dr.Jordan's advice given. 

## 2019-11-18 ENCOUNTER — Telehealth: Payer: Self-pay

## 2019-11-18 NOTE — Telephone Encounter (Signed)
Spoke to patient Xarelto 15 mg samples left at [facility] office front desk. 

## 2019-12-19 NOTE — Progress Notes (Signed)
Date:  12/25/2019   ID:  [name], [name] 11/30/1923, MRN [id number]  PCP:  Burton Apley, MD  Cardiologist:  Othelia Riederer [place], MD  Electrophysiologist:  None   Evaluation Performed:  Follow-Up Visit  Chief Complaint:  HTN,Afib   History of Present Illness:    [name] Jones is a 86 y.o. female seen for follow up HTN and AFib. She has a history of HTN. In early November 2016 she was noted to have new onset atrial fibrillation. She was started on Xarelto. She deteriorated and was admitted with acute diastolic CHF. She was diuresed with IV lasix. She had hyponatremia. She had an Echo in May 2016 while in NSR showing Normal LV function with grade 2 diastolic dysfunction. There was moderate MR and mild to moderate TR. Mild pulmonary HTN. Repeat Echo in hospital showed normal LV function with severe MR and TR and mod to severe biatrial enlargement. She later underwent DCCV on 09/15/15. On follow up she was noted to be bradycardic with HR into low 40s. Her metoprolol and cardizem doses were reduced. Later cardizem discontinued. On her last visit she was still bradycardic so we stopped her Toprol XL and reduced amiodarone to 100 mg daily.   On follow up today she is doing well. Notes her energy level is much better with prior medication changes.  She does have mild dyspnea on exertion that resolves with rest. No edema. Rare palpitations. Brings recording of BP readings. Systolic 130-160/50-55.     Past Medical History:  Diagnosis Date  . Atrial fibrillation (HCC)   . Chronic diastolic CHF (congestive heart failure) (HCC)   . Glaucoma   . Hypertension   . Hyponatremia   . Iron deficiency anemia   . Mitral insufficiency   . TIA (transient ischemic attack) 1997  . Tricuspid insufficiency    Past Surgical History:  Procedure Laterality Date  . ABDOMINAL HYSTERECTOMY    . CARDIOVERSION N/A 09/15/2015   Procedure: CARDIOVERSION;  Surgeon: Wendall Stade, MD;  Location: [facility] ENDOSCOPY;   Service: Cardiovascular;  Laterality: N/A;  . CATARACT EXTRACTION    . DIAGNOSTIC LAPAROSCOPY    . STRABISMUS SURGERY    . TONSILLECTOMY       Current Meds  Medication Sig  . acetaminophen (TYLENOL) 325 MG tablet Take 650 mg by mouth at bedtime as needed for moderate pain.   Marland Kitchen ALPRAZolam (XANAX) 0.25 MG tablet Take 1/2 tablet daily if needed  . amiodarone (PACERONE) 100 MG tablet Take 1 tablet (100 mg total) by mouth daily.  . calcium carbonate (CALTRATE 600) 1500 (600 Ca) MG TABS tablet Take 600 mg daily  . carteolol (OCUPRESS) 1 % ophthalmic solution Place 1 drop into both eyes 2 (two) times daily.  . cholecalciferol (VITAMIN D) 1000 units tablet Take 1 tablet (1,000 Units total) by mouth daily.  . furosemide (LASIX) 20 MG tablet Take by mouth. Pt takes 20 mg at lunch time and 40 mg at bedtime  . KLOR-CON M10 10 MEQ tablet TAKE 1 TABLET (10 MEQ TOTAL) BY MOUTH 2 (TWO) TIMES DAILY.  Marland Kitchen latanoprost (XALATAN) 0.005 % ophthalmic solution Place 1 drop into both eyes at bedtime.  Marland Kitchen levothyroxine (SYNTHROID) 50 MCG tablet Take 1 tablet (50 mcg total) by mouth daily before breakfast.  . Menthol (ICY HOT) 5 % PTCH Apply 1 patch topically every morning. Apply to left knee at 8AM and removed qhs  . metoprolol succinate (TOPROL XL) 25 MG 24 hr tablet Take  1 tablet (25 mg total) by mouth daily.  . Multiple Vitamin (MULTIVITAMIN WITH MINERALS) TABS tablet Take 1 tablet by mouth every evening.   . Probiotic Product (RA PROBIOTIC COLON CARE) CAPS Take 1 daily  . Rivaroxaban (XARELTO) 15 MG TABS tablet Take 1 tablet (15 mg total) by mouth daily with supper.  . vitamin C (ASCORBIC ACID) 500 MG tablet Take 500 mg by mouth daily with lunch.      Allergies:   Lisinopril   Social History   Tobacco Use  . Smoking status: Never Smoker  . Smokeless tobacco: Never Used  Substance Use Topics  . Alcohol use: Yes    Alcohol/week: 0.0 standard drinks    Comment: occasional  . Drug use: No     Family  Hx: The patient's family history includes Heart attack in her brother; Heart attack (age of onset: 15) in her son; Heart attack (age of onset: 78) in her father; Heart disease in her mother; Hypertension in her son; Ovarian cancer (age of onset: 43) in her mother.  ROS:   Please see the history of present illness.    All other systems reviewed and are negative.   Prior CV studies:   The following studies were reviewed today:  none  Labs/Other Tests and Data Reviewed:    EKG:  None today   Recent Labs: 10/09/2019: BUN 20; Creatinine, Ser 0.96; Potassium 4.8; Sodium 133   Recent Lipid Panel Lab Results  Component Value Date/Time   CHOL 192 09/29/2018 10:40 AM   TRIG 121 09/29/2018 10:40 AM   HDL 73 09/29/2018 10:40 AM   CHOLHDL 2.6 09/29/2018 10:40 AM   CHOLHDL 3.0 06/08/2015 11:22 PM   LDLCALC 95 09/29/2018 10:40 AM    Wt Readings from Last 3 Encounters:  12/25/19 125 lb 6.4 oz (56.9 kg)  06/17/19 125 lb 6.4 oz (56.9 kg)  12/10/18 124 lb 3.2 oz (56.3 kg)     Objective:    Vital Signs:  BP (!) 190/92   Pulse 70   Temp (!) 96.8 F (36 C)   Ht 5' (1.524 m)   Wt 125 lb 6.4 oz (56.9 kg)   SpO2 98%   BMI 24.49 kg/m    GENERAL:  Well appearing, elderly WF in NAD HEENT:  PERRL, EOMI, sclera are clear. Oropharynx is clear. NECK:  No jugular venous distention, carotid upstroke brisk and symmetric, no bruits, no thyromegaly or adenopathy LUNGS:  Clear to auscultation bilaterally CHEST:  Unremarkable HEART:  RRR,  PMI not displaced or sustained,S1 and S2 within normal limits, no S3, no S4: no clicks, no rubs, gr 2/6 systolic murmur at apex. ABD:  Soft, nontender. BS +, no masses or bruits. No hepatomegaly, no splenomegaly EXT:  2 + pulses throughout, no edema, no cyanosis no clubbing SKIN:  Warm and dry.  No rashes NEURO:  Alert and oriented x 3. Cranial nerves II through XII intact. PSYCH:  Cognitively intact    ASSESSMENT & PLAN:    1.  Atrial fibrillation s/p  DCCV in February 2017. Maintaining NSR on amiodarone.  Now on low dose 100 mg daily.   Continue  Xarelto. Labs followed by primary care.   2. Chronic diastolic CHF. Exacerbated by Afib. Volume status appears to be normal on current lasix dose.  3. Hyponatremia. Resolved.   4. Moderate to severe MR and TR.   Will treat medically. She is not a candidate for valve surgery.   5. Iron deficiency anemia.  6. HTN- labile. BP readings at home have been acceptable. Continue current therapy.    Medication Adjustments/Labs and Tests Ordered: Current medicines are reviewed at length with the patient today.  Concerns regarding medicines are outlined above.   Tests Ordered: No orders of the defined types were placed in this encounter.   Medication Changes: No orders of the defined types were placed in this encounter.   Disposition:  Follow up in 6 month(s)  Signed, Tariq Pernell [place], MD  12/25/2019 11:51 AM    [facility]

## 2019-12-25 ENCOUNTER — Ambulatory Visit (INDEPENDENT_AMBULATORY_CARE_PROVIDER_SITE_OTHER): Payer: Medicare Other | Admitting: Cardiology

## 2019-12-25 ENCOUNTER — Encounter: Payer: Self-pay | Admitting: Cardiology

## 2019-12-25 ENCOUNTER — Other Ambulatory Visit: Payer: Self-pay

## 2019-12-25 VITALS — BP 190/92 | HR 70 | Temp 96.8°F | Ht 60.0 in | Wt 125.4 lb

## 2019-12-25 DIAGNOSIS — I480 Paroxysmal atrial fibrillation: Secondary | ICD-10-CM

## 2019-12-25 DIAGNOSIS — I5032 Chronic diastolic (congestive) heart failure: Secondary | ICD-10-CM

## 2019-12-25 DIAGNOSIS — I10 Essential (primary) hypertension: Secondary | ICD-10-CM | POA: Diagnosis not present

## 2019-12-25 DIAGNOSIS — I340 Nonrheumatic mitral (valve) insufficiency: Secondary | ICD-10-CM | POA: Diagnosis not present

## 2020-02-01 ENCOUNTER — Telehealth: Payer: Self-pay | Admitting: Cardiology

## 2020-02-01 NOTE — Telephone Encounter (Signed)
Patient calling the office for samples of medication:   1.  What medication and dosage are you requesting samples for? Rivaroxaban (XARELTO) 15 MG TABS tablet  2.  Are you currently out of this medication? Patient only has 4 days left.

## 2020-02-01 NOTE — Telephone Encounter (Signed)
Pt informed samples available for pick up at the front desk. Pt verbalized understanding.  Xarelto 15 mg Qty: 3 bottles Lot # [id number] Exp: 3/23

## 2020-03-07 ENCOUNTER — Telehealth: Payer: Self-pay | Admitting: Cardiology

## 2020-03-07 NOTE — Telephone Encounter (Signed)
Xarelto 15 mg #13 tabs left at the front for the pt per her request.   Lot [id number] EXP 3/23

## 2020-03-07 NOTE — Telephone Encounter (Signed)
Patient calling the office for samples of medication:   1.  What medication and dosage are you requesting samples for? Rivaroxaban (XARELTO) 15 MG TABS tablet  2.  Are you currently out of this medication? No, but only has 6 tablets left

## 2020-03-14 ENCOUNTER — Other Ambulatory Visit: Payer: Self-pay | Admitting: Cardiology

## 2020-03-29 ENCOUNTER — Telehealth: Payer: Self-pay | Admitting: Cardiology

## 2020-03-29 NOTE — Telephone Encounter (Signed)
Patient calling the office for samples of medication:   1.  What medication and dosage are you requesting samples for? Rivaroxaban (XARELTO) 15 MG TABS tablet  2.  Are you currently out of this medication? no   

## 2020-03-29 NOTE — Telephone Encounter (Signed)
Should get the booster when 8 months since last vaccine  Satvik Parco [place] MD, [facility]

## 2020-03-29 NOTE — Telephone Encounter (Signed)
The patient has been made aware we are currently out of samples. She will try back later this week.  She was also inquiring about the covid booster shot and if she should get it. She would like to have Dr. Elvis Coil thoughts on this.

## 2020-03-30 NOTE — Telephone Encounter (Signed)
Spoke to patient Dr.Jordan's recommendation given. 

## 2020-04-04 ENCOUNTER — Other Ambulatory Visit: Payer: Self-pay | Admitting: Cardiology

## 2020-07-04 ENCOUNTER — Other Ambulatory Visit: Payer: Self-pay | Admitting: Cardiology

## 2020-07-04 DIAGNOSIS — I480 Paroxysmal atrial fibrillation: Secondary | ICD-10-CM

## 2020-07-04 DIAGNOSIS — I5032 Chronic diastolic (congestive) heart failure: Secondary | ICD-10-CM

## 2020-07-21 NOTE — Progress Notes (Signed)
Date:  07/27/2020   ID:  [name], Jones 07-May-1924, MRN [id number]  PCP:  Burton Apley, MD  Cardiologist:  Shakevia Sarris [place], MD  Electrophysiologist:  None   Evaluation Performed:  Follow-Up Visit  Chief Complaint:  HTN,Afib   History of Present Illness:    [name] Jones is a 86 y.o. female seen for follow up HTN and AFib. She has a history of HTN. In early November 2016 she was noted to have new onset atrial fibrillation. She was started on Xarelto. She deteriorated and was admitted with acute diastolic CHF. She was diuresed with IV lasix. She had hyponatremia. She had an Echo in May 2016 while in NSR showing Normal LV function with grade 2 diastolic dysfunction. There was moderate MR and mild to moderate TR. Mild pulmonary HTN. Repeat Echo in hospital showed normal LV function with severe MR and TR and mod to severe biatrial enlargement. She later underwent DCCV on 09/15/15. On follow up she was noted to be bradycardic with HR into low 40s. Her metoprolol and cardizem doses were reduced. Later cardizem discontinued. On prior visit she was still bradycardic so we stopped her Toprol XL and reduced amiodarone to 100 mg daily.   On follow up today she is doing well. She still feels tired at times. Notes problems with her back and knees. She is taking lasix 40 mg in the am and 20 mg at noon. No swelling or significant dyspnea. She brings BP readings which are typically elevated into the 160s systolic. Reports she had recent lab work with Dr Su Hilt. No palpitations.     Past Medical History:  Diagnosis Date  . Atrial fibrillation (HCC)   . Chronic diastolic CHF (congestive heart failure) (HCC)   . Glaucoma   . Hypertension   . Hyponatremia   . Iron deficiency anemia   . Mitral insufficiency   . TIA (transient ischemic attack) 1997  . Tricuspid insufficiency    Past Surgical History:  Procedure Laterality Date  . ABDOMINAL HYSTERECTOMY    . CARDIOVERSION N/A 09/15/2015    Procedure: CARDIOVERSION;  Surgeon: Wendall Stade, MD;  Location: [facility] ENDOSCOPY;  Service: Cardiovascular;  Laterality: N/A;  . CATARACT EXTRACTION    . DIAGNOSTIC LAPAROSCOPY    . STRABISMUS SURGERY    . TONSILLECTOMY       Current Meds  Medication Sig  . acetaminophen (TYLENOL) 325 MG tablet Take 650 mg by mouth at bedtime as needed for moderate pain.   Marland Kitchen ALPRAZolam (XANAX) 0.25 MG tablet Take 1/2 tablet daily if needed  . amiodarone (PACERONE) 100 MG tablet Take 1 tablet (100 mg total) by mouth daily.  . calcium carbonate (OSCAL) 1500 (600 Ca) MG TABS tablet Take 600 mg daily  . carteolol (OCUPRESS) 1 % ophthalmic solution Place 1 drop into both eyes 2 (two) times daily.  . cholecalciferol (VITAMIN D) 1000 units tablet Take 1 tablet (1,000 Units total) by mouth daily.  . furosemide (LASIX) 20 MG tablet Take 20 mg by mouth 2 (two) times daily. Take 40 mg in the morning and 20 mg at noon  . hydrALAZINE (APRESOLINE) 10 MG tablet Take 1 tablet (10 mg total) by mouth in the morning and at bedtime.  Marland Kitchen KLOR-CON M10 10 MEQ tablet TAKE 1 TABLET (10 MEQ TOTAL) BY MOUTH 2 (TWO) TIMES DAILY.  Marland Kitchen latanoprost (XALATAN) 0.005 % ophthalmic solution Place 1 drop into both eyes at bedtime.  Marland Kitchen levothyroxine (SYNTHROID) 50 MCG tablet Take  1 tablet (50 mcg total) by mouth daily before breakfast.  . Menthol 5 % PTCH Apply 1 patch topically every morning. Apply to left knee at 8AM and removed qhs  . metoprolol succinate (TOPROL-XL) 25 MG 24 hr tablet TAKE 1 TABLET BY MOUTH EVERY DAY  . Multiple Vitamin (MULTIVITAMIN WITH MINERALS) TABS tablet Take 1 tablet by mouth every evening.   . Probiotic Product (RA PROBIOTIC COLON CARE) CAPS Take 1 daily  . vitamin C (ASCORBIC ACID) 500 MG tablet Take 500 mg by mouth daily with lunch.   [name] Jones 15 MG TABS tablet TAKE 1 TABLET BY MOUTH WITH SUPPER  . [DISCONTINUED] furosemide (LASIX) 40 MG tablet TAKE 1 TABLET BY MOUTH TWICE A DAY     Allergies:   Lisinopril    Social History   Tobacco Use  . Smoking status: Never Smoker  . Smokeless tobacco: Never Used  Vaping Use  . Vaping Use: Never used  Substance Use Topics  . Alcohol use: Yes    Alcohol/week: 0.0 standard drinks    Comment: occasional  . Drug use: No     Family Hx: The patient's family history includes Heart attack in her brother; Heart attack (age of onset: 37) in her son; Heart attack (age of onset: 11) in her father; Heart disease in her mother; Hypertension in her son; Ovarian cancer (age of onset: 15) in her mother.  ROS:   Please see the history of present illness.    All other systems reviewed and are negative.   Prior CV studies:   The following studies were reviewed today:  none  Labs/Other Tests and Data Reviewed:    EKG:  Today shows NSR rate 66, normal. I have personally reviewed and interpreted this study.    Recent Labs: 10/09/2019: BUN 20; Creatinine, Ser 0.96; Potassium 4.8; Sodium 133   Recent Lipid Panel Lab Results  Component Value Date/Time   CHOL 192 09/29/2018 10:40 AM   TRIG 121 09/29/2018 10:40 AM   HDL 73 09/29/2018 10:40 AM   CHOLHDL 2.6 09/29/2018 10:40 AM   CHOLHDL 3.0 06/08/2015 11:22 PM   LDLCALC 95 09/29/2018 10:40 AM    Wt Readings from Last 3 Encounters:  07/27/20 125 lb (56.7 kg)  12/25/19 125 lb 6.4 oz (56.9 kg)  06/17/19 125 lb 6.4 oz (56.9 kg)     Objective:    Vital Signs:  BP (!) 148/76 (BP Location: Left Arm, Patient Position: Sitting, Cuff Size: Normal)   Pulse 66   Ht 5' (1.524 m)   Wt 125 lb (56.7 kg)   BMI 24.41 kg/m    GENERAL:  Well appearing, elderly WF in NAD HEENT:  PERRL, EOMI, sclera are clear. Oropharynx is clear. NECK:  No jugular venous distention, carotid upstroke brisk and symmetric, no bruits, no thyromegaly or adenopathy LUNGS:  Clear to auscultation bilaterally CHEST:  Unremarkable HEART:  RRR,  PMI not displaced or sustained,S1 and S2 within normal limits, no S3, no S4: no clicks, no rubs,  gr 2/6 systolic murmur at apex. ABD:  Soft, nontender. BS +, no masses or bruits. No hepatomegaly, no splenomegaly EXT:  2 + pulses throughout, no edema, no cyanosis no clubbing SKIN:  Warm and dry.  No rashes NEURO:  Alert and oriented x 3. Cranial nerves II through XII intact. PSYCH:  Cognitively intact    ASSESSMENT & PLAN:    1.  Atrial fibrillation s/p DCCV in February 2017. Maintaining NSR on amiodarone.  Now on low  dose 100 mg daily.   Continue  Xarelto. Labs followed by primary care. Will request a copy  2. Chronic diastolic CHF. Exacerbated by Afib. Volume status appears to be normal on current lasix dose.  3. Hyponatremia. Resolved.   4. Moderate to severe MR and TR.   Will treat medically. She is not a candidate for valve surgery.   5. Iron deficiency anemia.   6. HTN- labile. BP readings at home have been consistently high. Will try a low dose of hydralazine 10 mg bid to her current therapy.    Medication Adjustments/Labs and Tests Ordered: Current medicines are reviewed at length with the patient today.  Concerns regarding medicines are outlined above.   Tests Ordered: Orders Placed This Encounter  Procedures  . EKG 12-Lead    Medication Changes: Meds ordered this encounter  Medications  . hydrALAZINE (APRESOLINE) 10 MG tablet    Sig: Take 1 tablet (10 mg total) by mouth in the morning and at bedtime.    Dispense:  180 tablet    Refill:  3    Disposition:  Follow up in 6 month(s)  Signed, Sierrah Luevano [place], MD  07/27/2020 11:11 AM    [facility]

## 2020-07-27 ENCOUNTER — Other Ambulatory Visit: Payer: Self-pay

## 2020-07-27 ENCOUNTER — Encounter: Payer: Self-pay | Admitting: Cardiology

## 2020-07-27 ENCOUNTER — Ambulatory Visit (INDEPENDENT_AMBULATORY_CARE_PROVIDER_SITE_OTHER): Payer: Medicare Other | Admitting: Cardiology

## 2020-07-27 VITALS — BP 148/76 | HR 66 | Ht 60.0 in | Wt 125.0 lb

## 2020-07-27 DIAGNOSIS — I5032 Chronic diastolic (congestive) heart failure: Secondary | ICD-10-CM | POA: Diagnosis not present

## 2020-07-27 DIAGNOSIS — I480 Paroxysmal atrial fibrillation: Secondary | ICD-10-CM

## 2020-07-27 DIAGNOSIS — I10 Essential (primary) hypertension: Secondary | ICD-10-CM | POA: Diagnosis not present

## 2020-07-27 DIAGNOSIS — I340 Nonrheumatic mitral (valve) insufficiency: Secondary | ICD-10-CM

## 2020-07-27 MED ORDER — HYDRALAZINE HCL 10 MG PO TABS: 10.0000 mg | ORAL_TABLET | Freq: Two times a day (BID) | ORAL | 3 refills | Status: DC

## 2020-07-27 NOTE — Patient Instructions (Signed)
Add hydralazine 10 mg twice a day  Continue your other therapy  We will request a copy of your lab work from Dr Su Hilt

## 2020-08-22 ENCOUNTER — Telehealth: Payer: Self-pay | Admitting: Cardiology

## 2020-08-22 ENCOUNTER — Emergency Department (HOSPITAL_COMMUNITY)
Admission: EM | Admit: 2020-08-22 | Discharge: 2020-08-22 | Disposition: A | Discharge status: Home / Self Care | Payer: Medicare Other | Attending: Emergency Medicine | Admitting: Emergency Medicine

## 2020-08-22 ENCOUNTER — Other Ambulatory Visit: Payer: Self-pay

## 2020-08-22 DIAGNOSIS — I5032 Chronic diastolic (congestive) heart failure: Secondary | ICD-10-CM | POA: Diagnosis not present

## 2020-08-22 DIAGNOSIS — Z79899 Other long term (current) drug therapy: Secondary | ICD-10-CM | POA: Insufficient documentation

## 2020-08-22 DIAGNOSIS — I110 Hypertensive heart disease with heart failure: Secondary | ICD-10-CM | POA: Insufficient documentation

## 2020-08-22 DIAGNOSIS — Z7901 Long term (current) use of anticoagulants: Secondary | ICD-10-CM | POA: Insufficient documentation

## 2020-08-22 DIAGNOSIS — Z8673 Personal history of transient ischemic attack (TIA), and cerebral infarction without residual deficits: Secondary | ICD-10-CM | POA: Insufficient documentation

## 2020-08-22 DIAGNOSIS — R040 Epistaxis: Secondary | ICD-10-CM | POA: Insufficient documentation

## 2020-08-22 LAB — PROTIME-INR
INR: 1.4 — ABNORMAL HIGH (ref 0.8–1.2)
Prothrombin Time: 16.6 seconds — ABNORMAL HIGH (ref 11.4–15.2)

## 2020-08-22 LAB — I-STAT CHEM 8, ED
BUN: 19 mg/dL (ref 8–23)
Calcium, Ion: 1.15 mmol/L (ref 1.15–1.40)
Chloride: 103 mmol/L (ref 98–111)
Creatinine, Ser: 0.7 mg/dL (ref 0.44–1.00)
Glucose, Bld: 104 mg/dL — ABNORMAL HIGH (ref 70–99)
HCT: 36 % (ref 36.0–46.0)
Hemoglobin: 12.2 g/dL (ref 12.0–15.0)
Potassium: 4 mmol/L (ref 3.5–5.1)
Sodium: 136 mmol/L (ref 135–145)
TCO2: 26 mmol/L (ref 22–32)

## 2020-08-22 MED ORDER — METOPROLOL SUCCINATE ER 25 MG PO TB24: 25.0000 mg | ORAL_TABLET | Freq: Every day | ORAL | Status: DC

## 2020-08-22 MED ORDER — HYDRALAZINE HCL 10 MG PO TABS: 10.0000 mg | ORAL_TABLET | Freq: Two times a day (BID) | ORAL | Status: DC

## 2020-08-22 MED ORDER — HYDRALAZINE HCL 10 MG PO TABS: 10.0000 mg | ORAL_TABLET | Freq: Once | ORAL | Status: DC

## 2020-08-22 MED ORDER — AMIODARONE HCL 200 MG PO TABS: 100.0000 mg | ORAL_TABLET | Freq: Every day | ORAL | Status: DC

## 2020-08-22 MED ORDER — TRANEXAMIC ACID FOR EPISTAXIS
500.0000 mg | Freq: Once | TOPICAL | Status: AC
  Administered 2020-08-22: 500 mg via TOPICAL
  Filled 2020-08-22: qty 10

## 2020-08-22 MED ORDER — OXYMETAZOLINE HCL 0.05 % NA SOLN
1.0000 | Freq: Once | NASAL | Status: AC
  Administered 2020-08-22: 1 via NASAL
  Filled 2020-08-22: qty 30

## 2020-08-22 NOTE — ED Triage Notes (Signed)
BIB [facility] from home. C/o on and off nose bleeding since 10pm. Hx: nose bleed and hypertension. Pt nose is currently bleeding upon arrival.   158/82 70 heart rate  98% room air

## 2020-08-22 NOTE — ED Provider Notes (Signed)
[facility] EMERGENCY DEPARTMENT Provider Note   CSN: [id number] Arrival date & time: 08/22/20  8325     History Chief Complaint  Patient presents with  . Epistaxis    [name] Jones is a 87 y.o. female.  87 y/o female with hx of Afib (on Xarelto), CHF, HTN, IDA, TIA presents to the ED for nosebleed. Onset at 2200 this evening. States bleeding has been continuous since onset despite application of pressure. Had a similar episode 6 months ago that was managed by paramedics and patient did not need to seek treatment elsewhere. Onset tonight was spontaneous. She has been spitting up blood. No significant clots. Denies lightheadedness, nausea, vomiting, facial trauma. Not presently followed by ENT.  The history is provided by the patient. No language interpreter was used.  Epistaxis      Past Medical History:  Diagnosis Date  . Atrial fibrillation (HCC)   . Chronic diastolic CHF (congestive heart failure) (HCC)   . Glaucoma   . Hypertension   . Hyponatremia   . Iron deficiency anemia   . Mitral insufficiency   . TIA (transient ischemic attack) 1997  . Tricuspid insufficiency     Patient Active Problem List   Diagnosis Date Noted  . Closed burst fracture of lumbar vertebra (HCC) 06/09/2016  . CAP (community acquired pneumonia) 03/27/2016  . Bradycardia by electrocardiogram 09/21/2015  . Chronic diastolic CHF (congestive heart failure) (HCC) 09/05/2015  . Atrial fibrillation (HCC) 09/05/2015  . HTN (hypertension) 09/05/2015  . Hyponatremia 09/05/2015    Past Surgical History:  Procedure Laterality Date  . ABDOMINAL HYSTERECTOMY    . CARDIOVERSION N/A 09/15/2015   Procedure: CARDIOVERSION;  Surgeon: Wendall Stade, MD;  Location: [facility] ENDOSCOPY;  Service: Cardiovascular;  Laterality: N/A;  . CATARACT EXTRACTION    . DIAGNOSTIC LAPAROSCOPY    . STRABISMUS SURGERY    . TONSILLECTOMY       OB History   No obstetric history on file.     Family  History  Problem Relation Age of Onset  . Heart disease Mother   . Ovarian cancer Mother 27  . Heart attack Father 19  . Heart attack Brother   . Heart attack Son 86  . Hypertension Son     Social History   Tobacco Use  . Smoking status: Never Smoker  . Smokeless tobacco: Never Used  Vaping Use  . Vaping Use: Never used  Substance Use Topics  . Alcohol use: Yes    Alcohol/week: 0.0 standard drinks    Comment: occasional  . Drug use: No    Home Medications Prior to Admission medications   Medication Sig Start Date End Date Taking? Authorizing Provider  acetaminophen (TYLENOL) 325 MG tablet Take 650 mg by mouth at bedtime as needed for moderate pain.     [provider]  ALPRAZolam Prudy Feeler) 0.25 MG tablet Take 1/2 tablet daily if needed 07/08/18   [place], Peter M, MD  amiodarone (PACERONE) 100 MG tablet Take 1 tablet (100 mg total) by mouth daily. 08/24/19   [place], Peter M, MD  calcium carbonate (OSCAL) 1500 (600 Ca) MG TABS tablet Take 600 mg daily 07/08/18   [place], Peter M, MD  carteolol (OCUPRESS) 1 % ophthalmic solution Place 1 drop into both eyes 2 (two) times daily.    [provider]  cholecalciferol (VITAMIN D) 1000 units tablet Take 1 tablet (1,000 Units total) by mouth daily. 07/08/18   [place], Peter M, MD  furosemide (LASIX) 20  MG tablet Take 20 mg by mouth 2 (two) times daily. Take 40 mg in the morning and 20 mg at noon      hydrALAZINE (APRESOLINE) 10 MG tablet Take 1 tablet (10 mg total) by mouth in the morning and at bedtime. 07/27/20   [place], Peter M, MD  KLOR-CON M10 10 MEQ tablet TAKE 1 TABLET (10 MEQ TOTAL) BY MOUTH 2 (TWO) TIMES DAILY. 10/05/19   [place], Peter M, MD  latanoprost (XALATAN) 0.005 % ophthalmic solution Place 1 drop into both eyes at bedtime.    [provider]  levothyroxine (SYNTHROID) 50 MCG tablet Take 1 tablet (50 mcg total) by mouth daily before breakfast. 06/17/18   [place], Peter M, MD  Menthol 5 % [facility] Apply 1 patch  topically every morning. Apply to left knee at 8AM and removed qhs    [provider]  metoprolol succinate (TOPROL-XL) 25 MG 24 hr tablet TAKE 1 TABLET BY MOUTH EVERY DAY 03/14/20   [place], Peter M, MD  Multiple Vitamin (MULTIVITAMIN WITH MINERALS) TABS tablet Take 1 tablet by mouth every evening.     [provider]  Probiotic Product (RA PROBIOTIC COLON CARE) CAPS Take 1 daily 07/08/18   [place], Peter M, MD  vitamin C (ASCORBIC ACID) 500 MG tablet Take 500 mg by mouth daily with lunch.     [provider]  XARELTO 15 MG TABS tablet TAKE 1 TABLET BY MOUTH WITH SUPPER 04/05/20   [place], Peter M, MD    Allergies    Lisinopril  Review of Systems   Review of Systems  HENT: Positive for nosebleeds.   Ten systems reviewed and are negative for acute change, except as noted in the HPI.    Physical Exam Updated Vital Signs BP (!) 146/63   Pulse 67   Temp 98.4 F (36.9 C) (Oral)   Resp 19   Ht 5' (1.524 m)   Wt 55.3 kg   SpO2 95%   BMI 23.83 kg/m   Physical Exam Vitals and nursing note reviewed.  Constitutional:      General: She is not in acute distress.    Appearance: She is well-developed and well-nourished. She is not diaphoretic.     Comments: Nontoxic appearing and in NAD  HENT:     Head: Normocephalic and atraumatic.     Nose:     Comments: Minimal blood in right nare without septal deviation, hematoma.     Mouth/Throat:     Comments: Minimal blood in posterior oropharynx. Eyes:     General: No scleral icterus.    Extraocular Movements: EOM normal.     Conjunctiva/sclera: Conjunctivae normal.  Cardiovascular:     Rate and Rhythm: Normal rate and regular rhythm.     Pulses: Normal pulses.  Pulmonary:     Effort: Pulmonary effort is normal. No respiratory distress.     Comments: Respirations even and unlabored  Musculoskeletal:        General: Normal range of motion.     Cervical back: Normal range of motion.  Skin:    General: Skin is  warm and dry.     Coloration: Skin is not pale.     Findings: No erythema or rash.  Neurological:     Mental Status: She is alert and oriented to person, place, and time.     Coordination: Coordination normal.  Psychiatric:        Mood and Affect: Mood and affect normal.  Behavior: Behavior normal.     ED Results / Procedures / Treatments   Labs (all labs ordered are listed, but only abnormal results are displayed) Labs Reviewed  PROTIME-INR - Abnormal; Notable for the following components:      Result Value   Prothrombin Time 16.6 (*)    INR 1.4 (*)    All other components within normal limits  I-STAT CHEM 8, ED - Abnormal; Notable for the following components:   Glucose, Bld 104 (*)    All other components within normal limits    EKG None  Radiology No results found.  Procedures Procedures   Medications Ordered in ED Medications  tranexamic acid (CYKLOKAPRON) 1000 MG/10ML topical solution 500 mg (500 mg Topical Given by Other 08/22/20 0745)  oxymetazoline (AFRIN) 0.05 % nasal spray 1 spray (1 spray Each Nare Given 08/22/20 0715)    ED Course  I have reviewed the triage vital signs and the nursing notes.  Pertinent labs & imaging results that were available during my care of the patient were reviewed by me and considered in my medical decision making (see chart for details).  Clinical Course as of 08/22/20 0936  Mon Aug 22, 2020  8527 Currently hemostatic following Afrin spray. Will observe for repeat bleeding. [KH]  7824 No repeat bleeding noted from nares. Minimal blood in posterior oropharynx without signs of active bleeding.  AM blood pressure medications ordered for patient; however she reports taking her morning medications before being transported by EMS.  Her blood pressure remains high with a systolic around 200.  May be partially in part to Afrin use while in the ED.  On chart review, she had significant hypertension at her cardiology visit in June;  systolic of 190-198 in the office.  Was put on hydralazine for this reason.  Patient states that she is presently asymptomatic and denies headache, vision changes, chest pain, shortness of breath, diaphoresis, extremity numbness or paresthesias, nausea.  Discussed with patient the need for her to follow-up with cardiology to discuss any potential changes to her current regimen.  She verbalizes understanding. [KH]  0856 Ambulated to bathroom without difficulty. Will give additional 10mg  PO Hydralazine for persistent HTN.  [KH]  0916 BP 159/88. Will d/c additional Hydralazine. No recurrent epistaxis. Stable for discharge. [KH]    Clinical Course User Index [KH]   MDM Rules/Calculators/A&P                          87 year old female, chronically anticoagulated on Xarelto, presents to the emergency department for management of spontaneous right-sided epistaxis which began at 2200 last night.  She had minimal bleeding on arrival which resolved following use of Afrin.  She has been observed for almost 3 hours without evidence of repeat bleeding.  Hemoglobin 12.2 and vital signs stable.  Patient endorsing symptomatic improvement and comfort with discharge.  Have encouraged follow-up with her primary care doctor as needed.  She can continue her daily prescribed medications.  Indicated return for new or worsening symptoms.  Discharged in stable condition with no unaddressed concerns.   Final Clinical Impression(s) / ED Diagnoses Final diagnoses:  Right-sided epistaxis  Chronic anticoagulation    Rx / DC Orders ED Discharge Orders    None       97, PA-C 08/22/20 08/24/20    2353, MD 08/22/20 1051

## 2020-08-22 NOTE — Telephone Encounter (Signed)
Spoke to patient.she is calling  Because Er wanted her to let Dr [place] know she had a nose bleed and blood pressure was elevated earlier this morning    She went to ER this morning @5  am due to a nose bleed which had started about 10 pm Sunday.  blood pressure was elevated when EMS arrived at patient's home. She thinks 245/120 by EMS.  Per patient , ER was able to stop the nose bleed without cauterizing it. And blood pressure came down   she states she took Hydralazine this morning when EMS was there.  She states blood pressure now is 146/62. She is taking Xarelto 15 mg daily  . She has not taken her dose for the day.

## 2020-08-22 NOTE — Telephone Encounter (Signed)
Pt c/o BP issue: STAT if pt c/o blurred vision, one-sided weakness or slurred speech  1. What are your last 5 BP readings?  141/55  2. Are you having any other symptoms (ex. Dizziness, headache, blurred vision, passed out)? Nose bleed and feeling tired.  3. What is your BP issue? Patient states that her BP has been elevated and she had to go to the hospital by ambulance last night, she states she was discharged this morning and they didn't give her any medication for her BP, she would like to make Dr. [place] aware of this. Please advise.

## 2020-08-22 NOTE — Telephone Encounter (Signed)
Spoke to patient Dr.Jordan's advice given.Advised to use a humidifier and call back if nose bleeds continue .

## 2020-08-22 NOTE — ED Notes (Signed)
Pt d/c home per MD order. Discharge summary reviewed with pt, pt verbalizes understanding. Off unit via WC. Reports pt son is discharge ride home. No s/s of acute distress noted at discharge.

## 2020-08-22 NOTE — ED Notes (Signed)
Pt given decaffeinated coffee, per Tresa Endo - PA.

## 2020-08-22 NOTE — Telephone Encounter (Signed)
Just continue to monitor BP and let us know if it stays high  Peter [place] MD, [facility]

## 2020-08-22 NOTE — Discharge Instructions (Signed)
Use 1 spray of Afrin in your right nostril every 12 hours for the next 2 days. Continue your blood pressure medications. Have your blood pressure rechecked by your primary care doctor or cardiologist within the week. Return to the ED for new or concerning symptoms.

## 2020-09-09 NOTE — Telephone Encounter (Signed)
Spoke to patient Xarelto 15 mg samples left at [facility] office front desk. 

## 2020-09-23 ENCOUNTER — Telehealth: Payer: Self-pay | Admitting: Cardiology

## 2020-09-23 NOTE — Telephone Encounter (Signed)
Patient calling the office for samples of medication:   1.  What medication and dosage are you requesting samples for? XARELTO 15 MG TABS tablet  2.  Are you currently out of this medication?   No. Patient states she has a 1 week supply of medication remaining.

## 2020-09-23 NOTE — Telephone Encounter (Signed)
Spoke with patient, advised patient that samples of Xarelto 15mg  are at the front desk for pick up. Patient verbalized understanding.   Samples of Xarelto 15mg  were given to the patient, quantity 3 bottles, Lot Number Exp: 02-24

## 2020-09-29 ENCOUNTER — Other Ambulatory Visit: Payer: Self-pay | Admitting: Cardiology

## 2020-10-03 ENCOUNTER — Other Ambulatory Visit: Payer: Self-pay | Admitting: Cardiology

## 2020-10-03 DIAGNOSIS — I5032 Chronic diastolic (congestive) heart failure: Secondary | ICD-10-CM

## 2020-10-03 DIAGNOSIS — I480 Paroxysmal atrial fibrillation: Secondary | ICD-10-CM

## 2020-10-24 ENCOUNTER — Other Ambulatory Visit: Payer: Self-pay | Admitting: Cardiology

## 2020-10-24 DIAGNOSIS — I5032 Chronic diastolic (congestive) heart failure: Secondary | ICD-10-CM

## 2020-10-24 DIAGNOSIS — I480 Paroxysmal atrial fibrillation: Secondary | ICD-10-CM

## 2020-11-17 ENCOUNTER — Telehealth: Payer: Self-pay | Admitting: Cardiology

## 2020-11-17 NOTE — Telephone Encounter (Signed)
Spoke with patient who followed MD advice as noted below. She has lower BP readings now. Advised to hold PM hydralazine, check BP again before bed and in AM before meds and then routinely as she normally does. Advised to resume normal meds tomorrow.

## 2020-11-17 NOTE — Telephone Encounter (Signed)
I got a phone call from her ophthalmologist who is going to do a procedure.  It was dark the day.  Patient's blood pressure was above 200.  She had not taken her morning Lasix.  No notes to that conversation whether she is taking her other medicines.  They checked her blood pressure and it came back down to the 190s and the patient was fine.  She had no symptoms.  She said this was not unusual.  The procedure has been canceled and I have suggested that she go home and take an extra hydralazine and keep a blood pressure check.  If it remains elevated she is welcome to call us back for further suggestions.

## 2020-11-17 NOTE — Telephone Encounter (Signed)
Patient states she took her extra hydralazine a little after 9:00 am when she got home. She states around 10:45 am her BP was 132/57 and at 3:20 pm it was 113/42. Patient would like to know if she should skip her evening dose of the medication, since her BP is low.

## 2020-11-21 ENCOUNTER — Telehealth: Payer: Self-pay | Admitting: Cardiology

## 2020-11-21 NOTE — Telephone Encounter (Signed)
Spoke to patient she stated she had to cancel last cataract surgery due to elevated B/P 240/120.Stated Dr.Stoneburgar wanted her to ask Dr.Jordan if ok to have done tomorrow 11/22/20.Stated her B/P has been normal Spoke to Dr.Jordan he advised ok to have cataract surgery. Xarelto 15 mg samples left at front desk.

## 2020-11-21 NOTE — Telephone Encounter (Signed)
Will forward this message to Dr. Elvis Coil primary covering Nurse, to further review and follow-up with the pt when available, being pt requested this.

## 2020-11-21 NOTE — Telephone Encounter (Signed)
Patient would like for Dr. [place] or nurse to give her a phone call about a private matter

## 2020-12-20 ENCOUNTER — Other Ambulatory Visit: Payer: Self-pay | Admitting: Cardiology

## 2020-12-20 DIAGNOSIS — I5032 Chronic diastolic (congestive) heart failure: Secondary | ICD-10-CM

## 2020-12-20 DIAGNOSIS — I480 Paroxysmal atrial fibrillation: Secondary | ICD-10-CM

## 2020-12-28 ENCOUNTER — Telehealth: Payer: Self-pay | Admitting: Cardiology

## 2020-12-28 NOTE — Telephone Encounter (Signed)
Pt c/o medication issue:  1. Name of Medication: XARELTO 15 MG TABS tablet  2. How are you currently taking this medication (dosage and times per day)? As prescribed   3. Are you having a reaction (difficulty breathing--STAT)? No  4. What is your medication issue? PT is calling to find out if there are any more samples she can get for this medication.

## 2020-12-28 NOTE — Telephone Encounter (Signed)
Returned call to patient, advised patient that 3 bottles of 15mg  Xarelto LOT: EXP: 02/24  Advised patient to call back to office with any issues, questions, or concerns. Patient verbalized understanding.

## 2021-01-20 NOTE — Progress Notes (Signed)
Date:  01/25/2021   ID:  [name] Jones, [name] Jones Aug 04, 1923, MRN [id number]  PCP:  Burton Apley, MD  Cardiologist:  Johneric Mcfadden [place], MD  Electrophysiologist:  None   Evaluation Performed:  Follow-Up Visit  Chief Complaint:  HTN,Afib   History of Present Illness:    [name] Jones is a 87 y.o. female seen for follow up HTN and AFib. She has a history of HTN. In early November 2016 she was noted to have new onset atrial fibrillation. She was started on Xarelto. She deteriorated and was admitted with acute diastolic CHF. She was diuresed with IV lasix. She had hyponatremia. She had an Echo in May 2016 while in NSR showing Normal LV function with grade 2 diastolic dysfunction. There was moderate MR and mild to moderate TR. Mild pulmonary HTN. Repeat Echo in hospital showed normal LV function with severe MR and TR and mod to severe biatrial enlargement. She later underwent DCCV on 09/15/15. On follow up she was noted to be bradycardic with HR into low 40s. Her metoprolol and cardizem doses were reduced. Later cardizem discontinued. On prior visit she was still bradycardic so we stopped her Toprol XL and reduced amiodarone to 100 mg daily.   On follow up today she is doing well. She still has DOE. No swelling or significant dyspnea at rest. No orthopnea. She brings BP readings which are typically in the 140s-150s range. Few palpitations.    Past Medical History:  Diagnosis Date   Atrial fibrillation (HCC)    Chronic diastolic CHF (congestive heart failure) (HCC)    Glaucoma    Hypertension    Hyponatremia    Iron deficiency anemia    Mitral insufficiency    TIA (transient ischemic attack) 1997   Tricuspid insufficiency    Past Surgical History:  Procedure Laterality Date   ABDOMINAL HYSTERECTOMY     CARDIOVERSION N/A 09/15/2015   Procedure: CARDIOVERSION;  Surgeon: Wendall Stade, MD;  Location: [facility] ENDOSCOPY;  Service: Cardiovascular;  Laterality: N/A;   CATARACT EXTRACTION      DIAGNOSTIC LAPAROSCOPY     STRABISMUS SURGERY     TONSILLECTOMY       Current Meds  Medication Sig   acetaminophen (TYLENOL) 325 MG tablet Take 650 mg by mouth at bedtime as needed for moderate pain.    ALPRAZolam (XANAX) 0.25 MG tablet Take 1/2 tablet daily if needed   amiodarone (PACERONE) 100 MG tablet TAKE 1 TABLET BY MOUTH EVERY DAY   calcium carbonate (OSCAL) 1500 (600 Ca) MG TABS tablet Take 600 mg daily   carteolol (OCUPRESS) 1 % ophthalmic solution Place 1 drop into both eyes 2 (two) times daily.   cholecalciferol (VITAMIN D) 1000 units tablet Take 1 tablet (1,000 Units total) by mouth daily.   furosemide (LASIX) 20 MG tablet Take 20 mg by mouth 2 (two) times daily. Take 40 mg in the morning and 20 mg at noon   hydrALAZINE (APRESOLINE) 10 MG tablet Take 1 tablet (10 mg total) by mouth in the morning and at bedtime.   KLOR-CON M10 10 MEQ tablet TAKE 1 TABLET (10 MEQ TOTAL) BY MOUTH 2 (TWO) TIMES DAILY.   latanoprost (XALATAN) 0.005 % ophthalmic solution Place 1 drop into both eyes at bedtime.   levothyroxine (SYNTHROID) 50 MCG tablet Take 1 tablet (50 mcg total) by mouth daily before breakfast.   Menthol 5 % [facility] Apply 1 patch topically every morning. Apply to left knee at 8AM and removed qhs  metoprolol succinate (TOPROL-XL) 25 MG 24 hr tablet TAKE 1 TABLET BY MOUTH EVERY DAY   Multiple Vitamin (MULTIVITAMIN WITH MINERALS) TABS tablet Take 1 tablet by mouth every evening.    Probiotic Product (RA PROBIOTIC COLON CARE) CAPS Take 1 daily   vitamin C (ASCORBIC ACID) 500 MG tablet Take 500 mg by mouth daily with lunch.    XARELTO 15 MG TABS tablet TAKE 1 TABLET BY MOUTH WITH SUPPER     Allergies:   Lisinopril   Social History   Tobacco Use   Smoking status: Never   Smokeless tobacco: Never  Vaping Use   Vaping Use: Never used  Substance Use Topics   Alcohol use: Yes    Alcohol/week: 0.0 standard drinks    Comment: occasional   Drug use: No     Family Hx: The patient's  family history includes Heart attack in her brother; Heart attack (age of onset: 71) in her son; Heart attack (age of onset: 28) in her father; Heart disease in her mother; Hypertension in her son; Ovarian cancer (age of onset: 63) in her mother.  ROS:   Please see the history of present illness.    All other systems reviewed and are negative.   Prior CV studies:   The following studies were reviewed today:  none  Labs/Other Tests and Data Reviewed:    EKG:  Today shows NSR rate 66, normal. I have personally reviewed and interpreted this study.    Recent Labs: 08/22/2020: BUN 19; Creatinine, Ser 0.70; Hemoglobin 12.2; Potassium 4.0; Sodium 136   Recent Lipid Panel Lab Results  Component Value Date/Time   CHOL 192 09/29/2018 10:40 AM   TRIG 121 09/29/2018 10:40 AM   HDL 73 09/29/2018 10:40 AM   CHOLHDL 2.6 09/29/2018 10:40 AM   CHOLHDL 3.0 06/08/2015 11:22 PM   LDLCALC 95 09/29/2018 10:40 AM   Labs reviewed from Dr Su Hilt in November 2021. Normal CMET and TSH. Hgb 11.1. LDL 106. Cholesterol 203. HDL 70.  Wt Readings from Last 3 Encounters:  01/25/21 125 lb (56.7 kg)  08/22/20 122 lb (55.3 kg)  07/27/20 125 lb (56.7 kg)     Objective:    Vital Signs:  BP (!) 192/84   Pulse 73   Ht 5' (1.524 m)   Wt 125 lb (56.7 kg)   SpO2 97%   BMI 24.41 kg/m    GENERAL:  Well appearing, elderly WF in NAD HEENT:  PERRL, EOMI, sclera are clear. Oropharynx is clear. NECK:  No jugular venous distention, carotid upstroke brisk and symmetric, no bruits, no thyromegaly or adenopathy LUNGS:  Clear to auscultation bilaterally CHEST:  Unremarkable HEART:  RRR,  PMI not displaced or sustained,S1 and S2 within normal limits, no S3, no S4: no clicks, no rubs, gr 2/6 systolic murmur at apex. ABD:  Soft, nontender. BS +, no masses or bruits. No hepatomegaly, no splenomegaly EXT:  2 + pulses throughout, no edema, no cyanosis no clubbing SKIN:  Warm and dry.  No rashes NEURO:  Alert and  oriented x 3. Cranial nerves II through XII intact. PSYCH:  Cognitively intact    ASSESSMENT & PLAN:    1.  Atrial fibrillation s/p DCCV in February 2017. Maintaining NSR on amiodarone.  Now on low dose 100 mg daily.   Continue  Xarelto. Labs OK. Pulse regular today   2. Chronic diastolic CHF. Exacerbated by Afib. Volume status appears to be normal on current lasix dose.   3. Hyponatremia. Resolved.  4. Moderate to severe MR and TR.   Will treat medically. She is not a candidate for valve surgery.    5. Iron deficiency anemia.    6. HTN- labile. BP readings at home have been pretty reasonable. Continue current therapy.      Medication Adjustments/Labs and Tests Ordered: Current medicines are reviewed at length with the patient today.  Concerns regarding medicines are outlined above.   Tests Ordered: No orders of the defined types were placed in this encounter.   Medication Changes: No orders of the defined types were placed in this encounter.   Disposition:  Follow up in 6 month(s)  Signed, Roald Lukacs [place], MD  01/25/2021 11:23 AM    [facility]

## 2021-01-25 ENCOUNTER — Ambulatory Visit (INDEPENDENT_AMBULATORY_CARE_PROVIDER_SITE_OTHER): Payer: Medicare Other | Admitting: Cardiology

## 2021-01-25 ENCOUNTER — Other Ambulatory Visit: Payer: Self-pay

## 2021-01-25 ENCOUNTER — Encounter: Payer: Self-pay | Admitting: Cardiology

## 2021-01-25 VITALS — BP 192/84 | HR 73 | Ht 60.0 in | Wt 125.0 lb

## 2021-01-25 DIAGNOSIS — I5032 Chronic diastolic (congestive) heart failure: Secondary | ICD-10-CM

## 2021-01-25 DIAGNOSIS — I480 Paroxysmal atrial fibrillation: Secondary | ICD-10-CM

## 2021-01-25 DIAGNOSIS — I10 Essential (primary) hypertension: Secondary | ICD-10-CM

## 2021-01-25 DIAGNOSIS — Z7901 Long term (current) use of anticoagulants: Secondary | ICD-10-CM

## 2021-01-25 DIAGNOSIS — I340 Nonrheumatic mitral (valve) insufficiency: Secondary | ICD-10-CM

## 2021-02-27 ENCOUNTER — Other Ambulatory Visit: Payer: Self-pay | Admitting: Internal Medicine

## 2021-02-27 ENCOUNTER — Ambulatory Visit
Admission: RE | Admit: 2021-02-27 | Discharge: 2021-02-27 | Disposition: A | Discharge status: Home / Self Care | Payer: Medicare Other | Source: Ambulatory Visit | Attending: Internal Medicine | Admitting: Internal Medicine

## 2021-02-27 DIAGNOSIS — R059 Cough, unspecified: Secondary | ICD-10-CM

## 2021-03-06 ENCOUNTER — Telehealth: Payer: Self-pay | Admitting: Cardiology

## 2021-03-06 NOTE — Telephone Encounter (Signed)
Pt c/o medication issue:  1. Name of Medication:  XARELTO 15 MG TABS tablet  2. How are you currently taking this medication (dosage and times per day)?   3. Are you having a reaction (difficulty breathing--STAT)?   4. What is your medication issue?   Patient is requesting to speak with Dr. Elvis Coil nurse regarding Xarelto. She states she will discuss further when she speaks with the nurse. Please return call when able.

## 2021-03-06 NOTE — Telephone Encounter (Signed)
Spoke to patient Xarelto 15 mg samples left at front desk.

## 2021-03-30 ENCOUNTER — Telehealth: Payer: Self-pay | Admitting: Cardiology

## 2021-03-30 ENCOUNTER — Other Ambulatory Visit: Payer: Self-pay | Admitting: Cardiology

## 2021-03-30 DIAGNOSIS — I480 Paroxysmal atrial fibrillation: Secondary | ICD-10-CM

## 2021-03-30 DIAGNOSIS — I5032 Chronic diastolic (congestive) heart failure: Secondary | ICD-10-CM

## 2021-03-30 NOTE — Telephone Encounter (Signed)
D/C 07/27/20

## 2021-03-30 NOTE — Telephone Encounter (Signed)
Patient wanted to talk to [facility]. It was not urgent . Please have Elnita Maxwell call the patient

## 2021-03-30 NOTE — Telephone Encounter (Signed)
Made patient aware that [name] Jones is out of the office and will return next week. She stated she is okay waiting to talk with [name] Jones next week when she returns, it is not urgent.

## 2021-04-04 ENCOUNTER — Other Ambulatory Visit: Payer: Self-pay | Admitting: Cardiology

## 2021-04-04 DIAGNOSIS — I5032 Chronic diastolic (congestive) heart failure: Secondary | ICD-10-CM

## 2021-04-04 DIAGNOSIS — I480 Paroxysmal atrial fibrillation: Secondary | ICD-10-CM

## 2021-04-05 NOTE — Telephone Encounter (Signed)
Spoke to patient Xarelto 15 mg samples left at [facility] office front desk. 

## 2021-04-05 NOTE — Telephone Encounter (Signed)
Returned call to patient no answer.No voice mail. 

## 2021-04-06 ENCOUNTER — Other Ambulatory Visit: Payer: Self-pay | Admitting: Cardiology

## 2021-04-06 DIAGNOSIS — I480 Paroxysmal atrial fibrillation: Secondary | ICD-10-CM

## 2021-04-06 DIAGNOSIS — I5032 Chronic diastolic (congestive) heart failure: Secondary | ICD-10-CM

## 2021-04-07 ENCOUNTER — Other Ambulatory Visit: Payer: Self-pay

## 2021-04-07 ENCOUNTER — Telehealth: Payer: Self-pay | Admitting: Cardiology

## 2021-04-07 MED ORDER — FUROSEMIDE 20 MG PO TABS: 20.0000 mg | ORAL_TABLET | Freq: Two times a day (BID) | ORAL | 3 refills | Status: DC

## 2021-04-07 NOTE — Telephone Encounter (Signed)
D/C 07/27/20  

## 2021-04-07 NOTE — Telephone Encounter (Signed)
*  STAT* If patient is at the pharmacy, call can be transferred to refill team.   1. Which medications need to be refilled? (please list name of each medication and dose if known)  furosemide (LASIX) 20 MG tablet  2. Which pharmacy/location (including street and city if local pharmacy) is medication to be sent to? [id number] - [place], [place] - [address] AT [address] OF [address]  3. Do they need a 30 day or 90 day supply? 90 day supply

## 2021-04-28 ENCOUNTER — Other Ambulatory Visit: Payer: Self-pay | Admitting: Cardiology

## 2021-04-28 NOTE — Telephone Encounter (Signed)
Prescription refill request for Xarelto received.  Indication:atrial fib Last office visit:7/22 Weight:56.7 kg Age:87 Scr:0.7 CrCl:43.03 ml/min  Prescription refilled

## 2021-06-19 ENCOUNTER — Other Ambulatory Visit: Payer: Self-pay | Admitting: Cardiology

## 2021-07-03 ENCOUNTER — Other Ambulatory Visit: Payer: Self-pay | Admitting: Cardiology

## 2021-07-03 DIAGNOSIS — I480 Paroxysmal atrial fibrillation: Secondary | ICD-10-CM

## 2021-07-03 DIAGNOSIS — I5032 Chronic diastolic (congestive) heart failure: Secondary | ICD-10-CM

## 2021-07-31 NOTE — Progress Notes (Signed)
Date:  08/11/2021   ID:  [name], Jones 08/09/1923, MRN [id number]  PCP:  Burton Apley, MD  Cardiologist:  Adisen Bennion [place], MD  Electrophysiologist:  None   Evaluation Performed:  Follow-Up Visit  Chief Complaint:  HTN,Afib   History of Present Illness:    [name] Jones is a 88 y.o. female seen for follow up HTN and AFib. She has a history of HTN. In early November 2016 she was noted to have new onset atrial fibrillation. She was started on Xarelto. She deteriorated and was admitted with acute diastolic CHF. She was diuresed with IV lasix. She had hyponatremia. She had an Echo in May 2016 while in NSR showing Normal LV function with grade 2 diastolic dysfunction. There was moderate MR and mild to moderate TR. Mild pulmonary HTN. Repeat Echo in hospital showed normal LV function with severe MR and TR and mod to severe biatrial enlargement. She later underwent DCCV on 09/15/15. On follow up she was noted to be bradycardic with HR into low 40s. Her metoprolol and cardizem doses were reduced. Later cardizem discontinued. On prior visit she was still bradycardic so we stopped her Toprol XL and reduced amiodarone to 100 mg daily.   On follow up today she is doing well. She notes problems with double vision.  No swelling or significant dyspnea at rest. No orthopnea. She brings BP readings which are typically in the 150-160s range. Few palpitations.    Past Medical History:  Diagnosis Date   Atrial fibrillation (HCC)    Chronic diastolic CHF (congestive heart failure) (HCC)    Glaucoma    Hypertension    Hyponatremia    Iron deficiency anemia    Mitral insufficiency    TIA (transient ischemic attack) 1997   Tricuspid insufficiency    Past Surgical History:  Procedure Laterality Date   ABDOMINAL HYSTERECTOMY     CARDIOVERSION N/A 09/15/2015   Procedure: CARDIOVERSION;  Surgeon: Wendall Stade, MD;  Location: [facility] ENDOSCOPY;  Service: Cardiovascular;  Laterality: N/A;    CATARACT EXTRACTION     DIAGNOSTIC LAPAROSCOPY     STRABISMUS SURGERY     TONSILLECTOMY       Current Meds  Medication Sig   acetaminophen (TYLENOL) 325 MG tablet Take 650 mg by mouth at bedtime as needed for moderate pain.    ALPRAZolam (XANAX) 0.25 MG tablet Take 1/2 tablet daily if needed   amiodarone (PACERONE) 100 MG tablet TAKE 1 TABLET BY MOUTH EVERY DAY   calcium carbonate (OSCAL) 1500 (600 Ca) MG TABS tablet Take 600 mg daily   carteolol (OCUPRESS) 1 % ophthalmic solution Place 1 drop into both eyes 2 (two) times daily.   cholecalciferol (VITAMIN D) 1000 units tablet Take 1 tablet (1,000 Units total) by mouth daily.   furosemide (LASIX) 20 MG tablet Take 1 tablet (20 mg total) by mouth 2 (two) times daily. Take 20 mg by mouth 2 (two) times daily. Take 40 mg in the morning and 20 mg at noon   hydrALAZINE (APRESOLINE) 10 MG tablet TAKE 1 TABLET (10 MG TOTAL) BY MOUTH IN THE MORNING AND AT BEDTIME.   KLOR-CON M10 10 MEQ tablet TAKE 1 TABLET (10 MEQ TOTAL) BY MOUTH 2 (TWO) TIMES DAILY.   latanoprost (XALATAN) 0.005 % ophthalmic solution Place 1 drop into both eyes at bedtime.   levothyroxine (SYNTHROID) 50 MCG tablet Take 1 tablet (50 mcg total) by mouth daily before breakfast.   Menthol 5 % PTCH Apply  1 patch topically every morning. Apply to left knee at 8AM and removed qhs   metoprolol succinate (TOPROL-XL) 25 MG 24 hr tablet TAKE 1 TABLET BY MOUTH EVERY DAY   Multiple Vitamin (MULTIVITAMIN WITH MINERALS) TABS tablet Take 1 tablet by mouth every evening.    Probiotic Product (RA PROBIOTIC COLON CARE) CAPS Take 1 daily   vitamin C (ASCORBIC ACID) 500 MG tablet Take 500 mg by mouth daily with lunch.    XARELTO 15 MG TABS tablet TAKE 1 TABLET BY MOUTH WITH SUPPER     Allergies:   Lisinopril   Social History   Tobacco Use   Smoking status: Never   Smokeless tobacco: Never  Vaping Use   Vaping Use: Never used  Substance Use Topics   Alcohol use: Yes    Alcohol/week: 0.0  standard drinks    Comment: occasional   Drug use: No     Family Hx: The patient's family history includes Heart attack in her brother; Heart attack (age of onset: 9252) in her son; Heart attack (age of onset: 1276) in her father; Heart disease in her mother; Hypertension in her son; Ovarian cancer (age of onset: 6252) in her mother.  ROS:   Please see the history of present illness.    All other systems reviewed and are negative.   Prior CV studies:   The following studies were reviewed today:  none  Labs/Other Tests and Data Reviewed:    EKG:  Today shows NSR rate 68, occ PAC. Otherwise normal. I have personally reviewed and interpreted this study.    Recent Labs: 08/22/2020: BUN 19; Creatinine, Ser 0.70; Hemoglobin 12.2; Potassium 4.0; Sodium 136   Recent Lipid Panel Lab Results  Component Value Date/Time   CHOL 192 09/29/2018 10:40 AM   TRIG 121 09/29/2018 10:40 AM   HDL 73 09/29/2018 10:40 AM   CHOLHDL 2.6 09/29/2018 10:40 AM   CHOLHDL 3.0 06/08/2015 11:22 PM   LDLCALC 95 09/29/2018 10:40 AM   Labs reviewed from Dr Su Hiltoberts in November 2021. Normal CMET and TSH. Hgb 11.1. LDL 106. Cholesterol 203. HDL 70.  Wt Readings from Last 3 Encounters:  08/11/21 120 lb 12.8 oz (54.8 kg)  01/25/21 125 lb (56.7 kg)  08/22/20 122 lb (55.3 kg)     Objective:    Vital Signs:  BP 138/90    Pulse 68    Ht 5' (1.524 m)    Wt 120 lb 12.8 oz (54.8 kg)    SpO2 98%    BMI 23.59 kg/m    GENERAL:  Well appearing, elderly WF in NAD HEENT:  PERRL, EOMI, sclera are clear. Oropharynx is clear. NECK:  No jugular venous distention, carotid upstroke brisk and symmetric, no bruits, no thyromegaly or adenopathy LUNGS:  Clear to auscultation bilaterally CHEST:  Unremarkable HEART:  RRR,  PMI not displaced or sustained,S1 and S2 within normal limits, no S3, no S4: no clicks, no rubs, gr 2/6 systolic murmur at apex. ABD:  Soft, nontender. BS +, no masses or bruits. No hepatomegaly, no  splenomegaly EXT:  2 + pulses throughout, no edema, no cyanosis no clubbing SKIN:  Warm and dry.  No rashes NEURO:  Alert and oriented x 3. Cranial nerves II through XII intact. PSYCH:  Cognitively intact    ASSESSMENT & PLAN:    1.  Atrial fibrillation s/p DCCV in February 2017. Maintaining NSR on amiodarone.  Now on low dose 100 mg daily.   Continue  Xarelto. Labs done in  November by Dr Su Hilt. Will request a copy. In NSR today   2. Chronic diastolic CHF. Exacerbated by Afib. Volume status appears to be normal on current lasix dose.   3. Hyponatremia. Resolved.    4. Moderate to severe MR and TR.   Will treat medically. She is not a candidate for valve surgery.    5. Iron deficiency anemia.    6. HTN- labile. BP readings at home have been pretty reasonable. Continue current therapy. I would recommend liberal BP goals at her age.       Medication Adjustments/Labs and Tests Ordered: Current medicines are reviewed at length with the patient today.  Concerns regarding medicines are outlined above.   Tests Ordered: No orders of the defined types were placed in this encounter.    Medication Changes: No orders of the defined types were placed in this encounter.    Disposition:  Follow up in 6 month(s)  Signed, Bryella Diviney [place], MD  08/11/2021 4:19 PM    [facility]

## 2021-08-11 ENCOUNTER — Encounter: Payer: Self-pay | Admitting: Cardiology

## 2021-08-11 ENCOUNTER — Other Ambulatory Visit: Payer: Self-pay

## 2021-08-11 ENCOUNTER — Ambulatory Visit (INDEPENDENT_AMBULATORY_CARE_PROVIDER_SITE_OTHER): Payer: Medicare Other | Admitting: Cardiology

## 2021-08-11 VITALS — BP 138/90 | HR 68 | Ht 60.0 in | Wt 120.8 lb

## 2021-08-11 DIAGNOSIS — I480 Paroxysmal atrial fibrillation: Secondary | ICD-10-CM | POA: Diagnosis not present

## 2021-08-11 DIAGNOSIS — I340 Nonrheumatic mitral (valve) insufficiency: Secondary | ICD-10-CM

## 2021-08-11 DIAGNOSIS — I5032 Chronic diastolic (congestive) heart failure: Secondary | ICD-10-CM

## 2021-08-11 DIAGNOSIS — I10 Essential (primary) hypertension: Secondary | ICD-10-CM | POA: Diagnosis not present

## 2021-08-11 DIAGNOSIS — Z7901 Long term (current) use of anticoagulants: Secondary | ICD-10-CM

## 2021-09-17 ENCOUNTER — Other Ambulatory Visit: Payer: Self-pay | Admitting: Cardiology

## 2021-11-27 ENCOUNTER — Telehealth: Payer: Self-pay | Admitting: Cardiology

## 2021-11-27 NOTE — Telephone Encounter (Signed)
Spoke to patient Xarelto 15 mg samples left at front desk. 

## 2021-11-27 NOTE — Telephone Encounter (Signed)
Patient would like to speak to nurse Elnita Maxwell. She needs to ask her a couple of questions. Would not go into further details.  ?

## 2021-12-04 ENCOUNTER — Other Ambulatory Visit: Payer: Self-pay | Admitting: Cardiology

## 2021-12-26 ENCOUNTER — Other Ambulatory Visit: Payer: Self-pay | Admitting: Cardiology

## 2021-12-28 ENCOUNTER — Other Ambulatory Visit: Payer: Self-pay | Admitting: Cardiology

## 2022-01-04 ENCOUNTER — Telehealth: Payer: Self-pay | Admitting: Cardiology

## 2022-01-04 NOTE — Telephone Encounter (Signed)
Called pt, her son answered the phone. She is out at the moment and will call back.

## 2022-01-04 NOTE — Telephone Encounter (Signed)
Pt c/o medication issue:  1. Name of Medication: Xarelto  2. How are you currently taking this medication (dosage and times per day)? 1 tablet a day  3. Are you having a reaction (difficulty breathing--STAT)?   4. What is your medication issue?   Patient would not tell what she wanted about the Xarelto. She said she would tell the nurse when she talked to her

## 2022-01-10 NOTE — Telephone Encounter (Signed)
Contacted to answer xarelto question and says she has it all taken care of Note closed

## 2022-01-29 ENCOUNTER — Other Ambulatory Visit: Payer: Self-pay | Admitting: Cardiology

## 2022-02-04 NOTE — Progress Notes (Signed)
Date:  02/09/2022   ID:  [name] Jones, [name] Jones 12/04/1923, MRN [id number]  PCP:  Burton Apley, MD  Cardiologist:  Bresha Hosack [place], MD  Electrophysiologist:  None   Evaluation Performed:  Follow-Up Visit  Chief Complaint:  HTN,Afib   History of Present Illness:    [name] Jones is a 88 y.o. female seen for follow up HTN and AFib. She has a history of HTN. In early November 2016 she was noted to have new onset atrial fibrillation. She was started on Xarelto. She deteriorated and was admitted with acute diastolic CHF. She was diuresed with IV lasix. She had hyponatremia. She had an Echo in May 2016 while in NSR showing Normal LV function with grade 2 diastolic dysfunction. There was moderate MR and mild to moderate TR. Mild pulmonary HTN. Repeat Echo in hospital showed normal LV function with severe MR and TR and mod to severe biatrial enlargement. She later underwent DCCV on 09/15/15. On follow up she was noted to be bradycardic with HR into low 40s. Her metoprolol and cardizem doses were reduced. Later cardizem discontinued. On prior visit she was still bradycardic so we stopped her Toprol XL and reduced amiodarone to 100 mg daily.   On follow up today she is doing well. She notes problems with double vision.  No swelling or significant dyspnea at rest. Some DOE. No orthopnea. She brings BP readings at home are typically in the 130-160s systolic range. With diastolic 50-60. Few palpitations.    Past Medical History:  Diagnosis Date   Atrial fibrillation (HCC)    Chronic diastolic CHF (congestive heart failure) (HCC)    Glaucoma    Hypertension    Hyponatremia    Iron deficiency anemia    Mitral insufficiency    TIA (transient ischemic attack) 1997   Tricuspid insufficiency    Past Surgical History:  Procedure Laterality Date   ABDOMINAL HYSTERECTOMY     CARDIOVERSION N/A 09/15/2015   Procedure: CARDIOVERSION;  Surgeon: Wendall Stade, MD;  Location: [facility] ENDOSCOPY;  Service:  Cardiovascular;  Laterality: N/A;   CATARACT EXTRACTION     DIAGNOSTIC LAPAROSCOPY     STRABISMUS SURGERY     TONSILLECTOMY       Current Meds  Medication Sig   acetaminophen (TYLENOL) 325 MG tablet Take 650 mg by mouth at bedtime as needed for moderate pain.    ALPRAZolam (XANAX) 0.25 MG tablet Take 1/2 tablet daily if needed   amiodarone (PACERONE) 100 MG tablet TAKE 1 TABLET BY MOUTH EVERY DAY   calcium carbonate (OSCAL) 1500 (600 Ca) MG TABS tablet Take 600 mg daily   carteolol (OCUPRESS) 1 % ophthalmic solution Place 1 drop into both eyes 2 (two) times daily.   cholecalciferol (VITAMIN D) 1000 units tablet Take 1 tablet (1,000 Units total) by mouth daily.   erythromycin ophthalmic ointment Place into both eyes at bedtime.   furosemide (LASIX) 20 MG tablet TAKE 2 TABLETS EVERY MORNING AND TAKE 1 TABLET AT NOON   hydrALAZINE (APRESOLINE) 10 MG tablet TAKE 1 TABLET (10 MG TOTAL) BY MOUTH IN THE MORNING AND AT BEDTIME   KLOR-CON M10 10 MEQ tablet TAKE 1 TABLET BY MOUTH 2 TIMES DAILY.   latanoprost (XALATAN) 0.005 % ophthalmic solution Place 1 drop into both eyes at bedtime.   levothyroxine (SYNTHROID) 50 MCG tablet Take 1 tablet (50 mcg total) by mouth daily before breakfast.   Menthol 5 % PTCH Apply 1 patch topically every morning. Apply to  left knee at 8AM and removed qhs   metoprolol succinate (TOPROL-XL) 25 MG 24 hr tablet TAKE 1 TABLET BY MOUTH EVERY DAY   Multiple Vitamin (MULTIVITAMIN WITH MINERALS) TABS tablet Take 1 tablet by mouth every evening.    Probiotic Product (RA PROBIOTIC COLON CARE) CAPS Take 1 daily   vitamin C (ASCORBIC ACID) 500 MG tablet Take 500 mg by mouth daily with lunch.    XARELTO 15 MG TABS tablet TAKE 1 TABLET BY MOUTH WITH SUPPER     Allergies:   Lisinopril   Social History   Tobacco Use   Smoking status: Never   Smokeless tobacco: Never  Vaping Use   Vaping Use: Never used  Substance Use Topics   Alcohol use: Yes    Alcohol/week: 0.0 standard  drinks of alcohol    Comment: occasional   Drug use: No     Family Hx: The patient's family history includes Heart attack in her brother; Heart attack (age of onset: 4) in her son; Heart attack (age of onset: 39) in her father; Heart disease in her mother; Hypertension in her son; Ovarian cancer (age of onset: 54) in her mother.  ROS:   Please see the history of present illness.    All other systems reviewed and are negative.   Prior CV studies:   The following studies were reviewed today:  none  Labs/Other Tests and Data Reviewed:    EKG:  none today    Recent Labs: No results found for requested labs within last 365 days.   Recent Lipid Panel Lab Results  Component Value Date/Time   CHOL 192 09/29/2018 10:40 AM   TRIG 121 09/29/2018 10:40 AM   HDL 73 09/29/2018 10:40 AM   CHOLHDL 2.6 09/29/2018 10:40 AM   CHOLHDL 3.0 06/08/2015 11:22 PM   LDLCALC 95 09/29/2018 10:40 AM   Labs reviewed from Dr Su Hilt in November 2021. Normal CMET and TSH. Hgb 11.1. LDL 106. Cholesterol 203. HDL 70. Reviewed labs from November 2022. CMET and CBC normal.  Wt Readings from Last 3 Encounters:  02/09/22 123 lb 6.4 oz (56 kg)  08/11/21 120 lb 12.8 oz (54.8 kg)  01/25/21 125 lb (56.7 kg)     Objective:    Vital Signs:  BP (!) 180/70 (BP Location: Left Arm, Patient Position: Sitting, Cuff Size: Normal)   Pulse 62   Ht 5' (1.524 m)   Wt 123 lb 6.4 oz (56 kg)   SpO2 96%   BMI 24.10 kg/m    GENERAL:  Well appearing, elderly WF in NAD HEENT:  PERRL, EOMI, sclera are clear. Oropharynx is clear. NECK:  No jugular venous distention, carotid upstroke brisk and symmetric, no bruits, no thyromegaly or adenopathy LUNGS:  Clear to auscultation bilaterally CHEST:  Unremarkable HEART:  RRR,  PMI not displaced or sustained,S1 and S2 within normal limits, no S3, no S4: no clicks, no rubs, gr 2/6 systolic murmur at apex. ABD:  Soft, nontender. BS +, no masses or bruits. No hepatomegaly, no  splenomegaly EXT:  2 + pulses throughout, no edema, no cyanosis no clubbing SKIN:  Warm and dry.  No rashes NEURO:  Alert and oriented x 3. Cranial nerves II through XII intact. PSYCH:  Cognitively intact    ASSESSMENT & PLAN:    1.  Atrial fibrillation s/p DCCV in February 2017. Maintaining NSR on amiodarone.  Now on low dose 100 mg daily.   Continue  Xarelto. Appears to be in NSR today  2. Chronic diastolic CHF. Exacerbated by Afib. Volume status appears to be normal on current lasix dose.   3. Hyponatremia. Resolved.    4. Moderate to severe MR and TR.   Will treat medically. She is not a candidate for valve surgery.    5. Iron deficiency anemia. Hgb stable.    6. HTN- labile. BP elevated today but BP readings at home have been pretty reasonable. Continue current therapy. I would recommend liberal BP goals at her age.       Medication Adjustments/Labs and Tests Ordered: Current medicines are reviewed at length with the patient today.  Concerns regarding medicines are outlined above.   Tests Ordered: No orders of the defined types were placed in this encounter.    Medication Changes: No orders of the defined types were placed in this encounter.    Disposition:  Follow up in 6 month(s)  Signed, Mckinsley Koelzer [place], MD  02/09/2022 4:41 PM    [facility]

## 2022-02-09 ENCOUNTER — Encounter: Payer: Self-pay | Admitting: Cardiology

## 2022-02-09 ENCOUNTER — Ambulatory Visit (INDEPENDENT_AMBULATORY_CARE_PROVIDER_SITE_OTHER): Payer: Medicare Other | Admitting: Cardiology

## 2022-02-09 VITALS — BP 180/70 | HR 62 | Ht 60.0 in | Wt 123.4 lb

## 2022-02-09 DIAGNOSIS — I480 Paroxysmal atrial fibrillation: Secondary | ICD-10-CM

## 2022-02-09 DIAGNOSIS — I5032 Chronic diastolic (congestive) heart failure: Secondary | ICD-10-CM

## 2022-02-09 DIAGNOSIS — Z7901 Long term (current) use of anticoagulants: Secondary | ICD-10-CM

## 2022-02-09 DIAGNOSIS — I10 Essential (primary) hypertension: Secondary | ICD-10-CM | POA: Diagnosis not present

## 2022-05-07 ENCOUNTER — Other Ambulatory Visit: Payer: Self-pay | Admitting: Cardiology

## 2022-05-08 NOTE — Telephone Encounter (Signed)
Prescription refill request for Xarelto received.  Indication:Afib Last office visit:7/23 Weight:56 kg Age:88 Scr:0.8 CrCl:36.36 ml/min  Prescription refilled

## 2022-06-17 ENCOUNTER — Other Ambulatory Visit: Payer: Self-pay | Admitting: Cardiology

## 2022-07-30 ENCOUNTER — Other Ambulatory Visit: Payer: Self-pay | Admitting: Cardiology

## 2022-07-30 DIAGNOSIS — I5032 Chronic diastolic (congestive) heart failure: Secondary | ICD-10-CM

## 2022-07-30 DIAGNOSIS — I480 Paroxysmal atrial fibrillation: Secondary | ICD-10-CM

## 2022-08-07 NOTE — Progress Notes (Signed)
Date:  08/13/2022   ID:  [name], Jones October 14, 1923, MRN [id number]  PCP:  Lorene Dy, MD  Cardiologist:  Roniyah Llorens [place], MD  Electrophysiologist:  None   Evaluation Performed:  Follow-Up Visit  Chief Complaint:  HTN,Afib   History of Present Illness:    [name] Jones is a 89 y.o. female seen for follow up HTN and AFib. She has a history of HTN. In early November 2016 she was noted to have new onset atrial fibrillation. She was started on Xarelto. She deteriorated and was admitted with acute diastolic CHF. She was diuresed with IV lasix. She had hyponatremia. She had an Echo in May 2016 while in NSR showing Normal LV function with grade 2 diastolic dysfunction. There was moderate MR and mild to moderate TR. Mild pulmonary HTN. Repeat Echo in hospital showed normal LV function with severe MR and TR and mod to severe biatrial enlargement. She later underwent DCCV on 09/15/15. On follow up she was noted to be bradycardic with HR into low 40s. Her metoprolol and cardizem doses were reduced. Later cardizem discontinued. On prior visit she was still bradycardic so we stopped her Toprol XL and reduced amiodarone to 100 mg daily.   On follow up today she is doing well. She notes problems with double vision. Wears prisms in her glasses now.   No swelling or significant dyspnea at rest. Some DOE. No orthopnea. BP typically in the 932T systolic range.Few palpitations. She reports labs done in Nov with Dr Mancel Bale and he stopped her thyroid and iron pills.    Past Medical History:  Diagnosis Date   Atrial fibrillation (HCC)    Chronic diastolic CHF (congestive heart failure) (HCC)    Glaucoma    Hypertension    Hyponatremia    Iron deficiency anemia    Mitral insufficiency    TIA (transient ischemic attack) 1997   Tricuspid insufficiency    Past Surgical History:  Procedure Laterality Date   ABDOMINAL HYSTERECTOMY     CARDIOVERSION N/A 09/15/2015   Procedure: CARDIOVERSION;   Surgeon: Josue Hector, MD;  Location: [facility] ENDOSCOPY;  Service: Cardiovascular;  Laterality: N/A;   CATARACT EXTRACTION     DIAGNOSTIC LAPAROSCOPY     STRABISMUS SURGERY     TONSILLECTOMY       Current Meds  Medication Sig   acetaminophen (TYLENOL) 325 MG tablet Take 650 mg by mouth at bedtime as needed for moderate pain.    ALPRAZolam (XANAX) 0.25 MG tablet Take 1/2 tablet daily if needed   amiodarone (PACERONE) 100 MG tablet TAKE 1 TABLET BY MOUTH EVERY DAY   calcium carbonate (OSCAL) 1500 (600 Ca) MG TABS tablet Take 600 mg daily   carteolol (OCUPRESS) 1 % ophthalmic solution Place 1 drop into both eyes 2 (two) times daily.   cholecalciferol (VITAMIN D) 1000 units tablet Take 1 tablet (1,000 Units total) by mouth daily.   erythromycin ophthalmic ointment Place into both eyes at bedtime.   furosemide (LASIX) 20 MG tablet TAKE 2 TABLETS EVERY MORNING AND TAKE 1 TABLET AT NOON   latanoprost (XALATAN) 0.005 % ophthalmic solution Place 1 drop into both eyes at bedtime.   Menthol 5 % PTCH Apply 1 patch topically every morning. Apply to left knee at 8AM and removed qhs   metoprolol succinate (TOPROL-XL) 25 MG 24 hr tablet TAKE 1 TABLET BY MOUTH EVERY DAY   Multiple Vitamin (MULTIVITAMIN WITH MINERALS) TABS tablet Take 1 tablet by mouth every evening.  potassium chloride (KLOR-CON M10) 10 MEQ tablet TAKE 1 TABLET BY MOUTH TWICE A DAY   Probiotic Product (RA PROBIOTIC COLON CARE) CAPS Take 1 daily   vitamin C (ASCORBIC ACID) 500 MG tablet Take 500 mg by mouth daily with lunch.    XARELTO 15 MG TABS tablet TAKE 1 TABLET BY MOUTH WITH SUPPER   [DISCONTINUED] hydrALAZINE (APRESOLINE) 10 MG tablet TAKE 1 TABLET (10 MG TOTAL) BY MOUTH IN THE MORNING AND AT BEDTIME     Allergies:   Lisinopril   Social History   Tobacco Use   Smoking status: Never   Smokeless tobacco: Never  Vaping Use   Vaping Use: Never used  Substance Use Topics   Alcohol use: Yes    Alcohol/week: 0.0 standard drinks  of alcohol    Comment: occasional   Drug use: No     Family Hx: The patient's family history includes Heart attack in her brother; Heart attack (age of onset: 51) in her son; Heart attack (age of onset: 72) in her father; Heart disease in her mother; Hypertension in her son; Ovarian cancer (age of onset: 58) in her mother.  ROS:   Please see the history of present illness.    All other systems reviewed and are negative.   Prior CV studies:   The following studies were reviewed today:  none  Labs/Other Tests and Data Reviewed:    EKG:  today shows NSR rate 67. Normal. I have personally reviewed and interpreted this study.     Recent Labs: No results found for requested labs within last 365 days.   Recent Lipid Panel Lab Results  Component Value Date/Time   CHOL 192 09/29/2018 10:40 AM   TRIG 121 09/29/2018 10:40 AM   HDL 73 09/29/2018 10:40 AM   CHOLHDL 2.6 09/29/2018 10:40 AM   CHOLHDL 3.0 06/08/2015 11:22 PM   LDLCALC 95 09/29/2018 10:40 AM   Labs reviewed from Dr Su Hilt in November 2021. Normal CMET and TSH. Hgb 11.1. LDL 106. Cholesterol 203. HDL 70. Reviewed labs from November 2022. CMET and CBC normal.  Wt Readings from Last 3 Encounters:  08/13/22 121 lb 6.4 oz (55.1 kg)  02/09/22 123 lb 6.4 oz (56 kg)  08/11/21 120 lb 12.8 oz (54.8 kg)     Objective:    Vital Signs:  BP (!) 178/62 (BP Location: Left Arm, Patient Position: Sitting, Cuff Size: Normal)   Pulse 67   Ht 5' (1.524 m)   Wt 121 lb 6.4 oz (55.1 kg)   SpO2 95%   BMI 23.71 kg/m    GENERAL:  Well appearing, elderly WF in NAD HEENT:  PERRL, EOMI, sclera are clear. Oropharynx is clear. NECK:  No jugular venous distention, carotid upstroke brisk and symmetric, no bruits, no thyromegaly or adenopathy LUNGS:  Clear to auscultation bilaterally CHEST:  Unremarkable HEART:  RRR,  PMI not displaced or sustained,S1 and S2 within normal limits, no S3, no S4: no clicks, no rubs, gr 2/6 systolic murmur at  apex. ABD:  Soft, nontender. BS +, no masses or bruits. No hepatomegaly, no splenomegaly EXT:  2 + pulses throughout, no edema, no cyanosis no clubbing SKIN:  Warm and dry.  No rashes NEURO:  Alert and oriented x 3. Cranial nerves II through XII intact. PSYCH:  Cognitively intact    ASSESSMENT & PLAN:    1.  Atrial fibrillation s/p DCCV in February 2017. Maintaining NSR on amiodarone.  Now on low dose 100 mg daily.  Continue  Xarelto. Appears to be in NSR today. Labs followed by Dr Mancel Bale.    2. Chronic diastolic CHF. Exacerbated by Afib. Volume status appears to be normal on current lasix dose.   3. Hyponatremia. Resolved.    4. Moderate to severe MR and TR.   Will treat medically. She is not a candidate for valve surgery.    5. Iron deficiency anemia. Hgb stable.    6. HTN- labile. BP elevated today. Will increase hydralazine to 10 mg tid. Continue lasix and Toprol XL       Medication Adjustments/Labs and Tests Ordered: Current medicines are reviewed at length with the patient today.  Concerns regarding medicines are outlined above.   Tests Ordered: Orders Placed This Encounter  Procedures   EKG 12-Lead     Medication Changes: Meds ordered this encounter  Medications   hydrALAZINE (APRESOLINE) 10 MG tablet    Sig: Take 1 tablet (10 mg total) by mouth 3 (three) times daily.    Dispense:  270 tablet    Refill:  3     Disposition:  Follow up in 6 month(s)  Signed, Dimitria Ketchum [place], MD  08/13/2022 11:50 AM    [place]

## 2022-08-13 ENCOUNTER — Encounter: Payer: Self-pay | Admitting: Cardiology

## 2022-08-13 ENCOUNTER — Ambulatory Visit: Payer: Medicare Other | Attending: Cardiology | Admitting: Cardiology

## 2022-08-13 VITALS — BP 178/62 | HR 67 | Ht 60.0 in | Wt 121.4 lb

## 2022-08-13 DIAGNOSIS — I340 Nonrheumatic mitral (valve) insufficiency: Secondary | ICD-10-CM

## 2022-08-13 DIAGNOSIS — I480 Paroxysmal atrial fibrillation: Secondary | ICD-10-CM | POA: Diagnosis not present

## 2022-08-13 DIAGNOSIS — Z7901 Long term (current) use of anticoagulants: Secondary | ICD-10-CM

## 2022-08-13 DIAGNOSIS — I10 Essential (primary) hypertension: Secondary | ICD-10-CM | POA: Diagnosis not present

## 2022-08-13 MED ORDER — HYDRALAZINE HCL 10 MG PO TABS: 10.0000 mg | ORAL_TABLET | Freq: Three times a day (TID) | ORAL | 3 refills | Status: DC

## 2022-08-13 NOTE — Patient Instructions (Signed)
Medication Instructions:  Your physician recommends that you continue on your current medications as directed. Please refer to the Current Medication list given to you today.  *If you need a refill on your cardiac medications before your next appointment, please call your pharmacy*   Lab Work: None   Testing/Procedures: None   Follow-Up: At [facility], you and your health needs are our priority.  As part of our continuing mission to provide you with exceptional heart care, we have created designated Provider Care Teams.  These Care Teams include your primary Cardiologist (physician) and Advanced Practice Providers (APPs -  Physician Assistants and Nurse Practitioners) who all work together to provide you with the care you need, when you need it.  We recommend signing up for the patient portal called "MyChart".  Sign up information is provided on this After Visit Summary.  MyChart is used to connect with patients for Virtual Visits (Telemedicine).  Patients are able to view lab/test results, encounter notes, upcoming appointments, etc.  Non-urgent messages can be sent to your provider as well.   To learn more about what you can do with MyChart, go to [email or web address].    Your next appointment:   6 month(s)  Provider:   Peter Jordan, MD     Other Instructions   

## 2022-09-27 ENCOUNTER — Telehealth: Payer: Self-pay | Admitting: Cardiology

## 2022-09-27 MED ORDER — RIVAROXABAN 15 MG PO TABS: 15.0000 mg | ORAL_TABLET | Freq: Every day | ORAL | Status: DC

## 2022-09-27 NOTE — Telephone Encounter (Signed)
Spoke to [name] Jones she is calling on behalf of [name] Jones was requesting an appointment for patient sooner than Sept 2024 .  This is the appointment time that was given . [name] Jones stated do to the patient age. Dr Martinique  wanted to see patient in the month of July 2024.   RN reschedule patient to January 22, 2023 at 10:30 am.   [name] Jones also wanted to know if any samples of Xarelto is available for patient.  RN asked if patient ever tried for patient assistance for Xarelto 15 mg. [name] Jones states she did not think so.  RN  explained  the process to [name]. She states she was interested in filling out assistance form.   RN informed [name] Jones  2 weeks of samples are available for pick up and patient assistance form to fill out.  She states she will have the caretaker to pickup tomorrow

## 2022-10-15 ENCOUNTER — Telehealth: Payer: Self-pay | Admitting: Cardiology

## 2022-10-15 NOTE — Telephone Encounter (Signed)
New Message:     Patient says she would like for Malachy Mood to give her a call please.. She says some questions she needs to discuss with her.

## 2022-10-15 NOTE — Telephone Encounter (Signed)
Spoke to patient she stated she needs to have dental work.She wanted to know if she needs to hold Xarelto.Advised to have dentist send a clearance to our office. Stated her B/P has been up and down for the past couple of days.Stated she had a birthday party and went off her diet.She will eat better and will monitor B/P daily for the next week.Advised to call back to report readings.

## 2022-10-17 ENCOUNTER — Telehealth: Payer: Self-pay

## 2022-10-17 NOTE — Telephone Encounter (Signed)
Spoke to patient's daughter Hoyle Sauer she stated she will complete patient assistance form for Xarelto.Advised to bring back with proof of income.

## 2022-10-26 NOTE — Telephone Encounter (Signed)
Patient's daughter is requesting to speak to Elnita Maxwell, LPN to discuss patient assistance.

## 2022-10-26 NOTE — Telephone Encounter (Signed)
Spoke to patient's daughter Eber Jones she will complete patient assistance application for Xarelto and bring to office next week along with proof of her income.

## 2022-10-31 ENCOUNTER — Telehealth: Payer: Self-pay | Admitting: Cardiology

## 2022-10-31 NOTE — Telephone Encounter (Signed)
Paper Work Dropped Off: Patient Assistance Form  Date: 10/31/2022  Location of paper:  Provider Mailbox

## 2022-10-31 NOTE — Progress Notes (Unsigned)
Date:  11/01/2022   ID:  [name] Jones, DOB [birth year], MRN [id number]  PCP:  [name] Apleyoberts, Ronald, MD  Cardiologist:  Keerat Denicola [place], MD  Electrophysiologist:  None   Evaluation Performed:  Follow-Up Visit  Chief Complaint:  HTN,Afib   History of Present Illness:    [name] Jones is a 89 y.o. female seen for follow up HTN and AFib. She has a history of HTN. In early November 2016 she was noted to have new onset atrial fibrillation. She was started on Xarelto. She deteriorated and was admitted with acute diastolic CHF. She was diuresed with IV lasix. She had hyponatremia. She had an Echo in May 2016 while in NSR showing Normal LV function with grade 2 diastolic dysfunction. There was moderate MR and mild to moderate TR. Mild pulmonary HTN. Repeat Echo in hospital showed normal LV function with severe MR and TR and mod to severe biatrial enlargement. She later underwent DCCV on 09/15/15. On follow up she was noted to be bradycardic with HR into low 40s. Her metoprolol and cardizem doses were reduced. Later cardizem discontinued. On prior visit she was still bradycardic so we stopped her Toprol XL and reduced amiodarone to 100 mg daily this was in Nov 2020.   On follow up today she is doing well. She notes problems with double vision. Getting new lens for her glasses.   She has chronic  DOE. No orthopnea. BP generally better on hydralazine 10 mg tid but occasional increase to 160. Few palpitations. Does note on her monitor pulse has been irregular since mid March.   Past Medical History:  Diagnosis Date   Atrial fibrillation    Chronic diastolic CHF (congestive heart failure)    Glaucoma    Hypertension    Hyponatremia    Iron deficiency anemia    Mitral insufficiency    TIA (transient ischemic attack) 1997   Tricuspid insufficiency    Past Surgical History:  Procedure Laterality Date   ABDOMINAL HYSTERECTOMY     CARDIOVERSION N/A 09/15/2015   Procedure: CARDIOVERSION;  Surgeon:  Wendall StadePeter C Nishan, MD;  [facility];  Service: Cardiovascular;  Laterality: N/A;   CATARACT EXTRACTION     DIAGNOSTIC LAPAROSCOPY     STRABISMUS SURGERY     TONSILLECTOMY       Current Meds  Medication Sig   acetaminophen (TYLENOL) 325 MG tablet Take 650 mg by mouth at bedtime as needed for moderate pain.    ALPRAZolam (XANAX) 0.25 MG tablet Take 1/2 tablet daily if needed   amiodarone (PACERONE) 100 MG tablet TAKE 1 TABLET BY MOUTH EVERY DAY   calcium carbonate (OSCAL) 1500 (600 Ca) MG TABS tablet Take 600 mg daily   carteolol (OCUPRESS) 1 % ophthalmic solution Place 1 drop into both eyes 2 (two) times daily.   cholecalciferol (VITAMIN D) 1000 units tablet Take 1 tablet (1,000 Units total) by mouth daily.   erythromycin ophthalmic ointment Place into both eyes at bedtime.   furosemide (LASIX) 20 MG tablet TAKE 2 TABLETS EVERY MORNING AND TAKE 1 TABLET AT NOON   hydrALAZINE (APRESOLINE) 10 MG tablet Take 1 tablet (10 mg total) by mouth 3 (three) times daily.   latanoprost (XALATAN) 0.005 % ophthalmic solution Place 1 drop into both eyes at bedtime.   levothyroxine (SYNTHROID) 50 MCG tablet Take 50 mcg by mouth daily before breakfast.   Menthol 5 % PTCH Apply 1 patch topically every morning. Apply to left knee at 8AM and removed  qhs   metoprolol succinate (TOPROL-XL) 25 MG 24 hr tablet TAKE 1 TABLET BY MOUTH EVERY DAY   Multiple Vitamin (MULTIVITAMIN WITH MINERALS) TABS tablet Take 1 tablet by mouth every evening.    potassium chloride (KLOR-CON M10) 10 MEQ tablet TAKE 1 TABLET BY MOUTH TWICE A DAY   Probiotic Product (RA PROBIOTIC COLON CARE) CAPS Take 1 daily   Rivaroxaban (XARELTO) 15 MG TABS tablet Take 1 tablet (15 mg total) by mouth daily with supper.   vitamin C (ASCORBIC ACID) 500 MG tablet Take 500 mg by mouth daily with lunch.      Allergies:   Lisinopril   Social History   Tobacco Use   Smoking status: Never   Smokeless tobacco: Never  Vaping Use   Vaping Use:  Never used  Substance Use Topics   Alcohol use: Yes    Alcohol/week: 0.0 standard drinks of alcohol    Comment: occasional   Drug use: No     Family Hx: The patient's family history includes Heart attack in her brother; Heart attack (age of onset: 36) in her son; Heart attack (age of onset: 80) in her father; Heart disease in her mother; Hypertension in her son; Ovarian cancer (age of onset: 58) in her mother.  ROS:   Please see the history of present illness.    All other systems reviewed and are negative.   Prior CV studies:   The following studies were reviewed today:  none  Labs/Other Tests and Data Reviewed:    EKG:  today shows Afib rate 87. Otherwise norma. I have personally reviewed and interpreted this study.     Recent Labs: No results found for requested labs within last 365 days.   Recent Lipid Panel Lab Results  Component Value Date/Time   CHOL 192 09/29/2018 10:40 AM   TRIG 121 09/29/2018 10:40 AM   HDL 73 09/29/2018 10:40 AM   CHOLHDL 2.6 09/29/2018 10:40 AM   CHOLHDL 3.0 06/08/2015 11:22 PM   LDLCALC 95 09/29/2018 10:40 AM   Labs reviewed from Dr Su Hilt in November 2021. Normal CMET and TSH. Hgb 11.1. LDL 106. Cholesterol 203. HDL 70. Reviewed labs from November 2022. CMET and CBC normal.  Wt Readings from Last 3 Encounters:  11/01/22 122 lb 3.2 oz (55.4 kg)  08/13/22 121 lb 6.4 oz (55.1 kg)  02/09/22 123 lb 6.4 oz (56 kg)     Objective:    Vital Signs:  BP 134/80   Pulse 87   Ht 5' (1.524 m)   Wt 122 lb 3.2 oz (55.4 kg)   SpO2 96%   BMI 23.87 kg/m    GENERAL:  Well appearing, elderly WF in NAD HEENT:  PERRL, EOMI, sclera are clear. Oropharynx is clear. NECK:  No jugular venous distention, carotid upstroke brisk and symmetric, no bruits, no thyromegaly or adenopathy LUNGS:  Clear to auscultation bilaterally CHEST:  Unremarkable HEART:  RRR,  PMI not displaced or sustained,S1 and S2 within normal limits, no S3, no S4: no clicks, no  rubs, gr 2/6 systolic murmur at apex. ABD:  Soft, nontender. BS +, no masses or bruits. No hepatomegaly, no splenomegaly EXT:  2 + pulses throughout, no edema, no cyanosis no clubbing SKIN:  Warm and dry.  No rashes NEURO:  Alert and oriented x 3. Cranial nerves II through XII intact. PSYCH:  Cognitively intact    ASSESSMENT & PLAN:    1.  Atrial fibrillation s/p DCCV in February 2017. Had maintained  NSR  on low dose amiodarone but now back in Afib with controlled rate. Really no new symptoms.  Continue  Xarelto. Will continue current therapy.    2. Chronic diastolic CHF.  Volume status appears to be normal on current lasix dose.   3. Hyponatremia. Resolved.    4. Moderate to severe MR and TR.   Will treat medically. She is not a candidate for valve surgery.    5. Iron deficiency anemia. Hgb stable.    6. HTN- labile. BP looks good today.  Will continue hydralazine to 10 mg tid. Continue lasix and Toprol XL if BP sustained higher will increase hydralazine.       Medication Adjustments/Labs and Tests Ordered: Current medicines are reviewed at length with the patient today.  Concerns regarding medicines are outlined above.   Tests Ordered: Orders Placed This Encounter  Procedures   EKG 12-Lead     Medication Changes: No orders of the defined types were placed in this encounter.    Disposition:  Follow up 3 months.  Signed, Anberlin Diez [place], MD  11/01/2022 11:36 AM    [facility]

## 2022-11-01 ENCOUNTER — Ambulatory Visit: Payer: Medicare Other | Attending: Cardiology | Admitting: Cardiology

## 2022-11-01 ENCOUNTER — Encounter: Payer: Self-pay | Admitting: Cardiology

## 2022-11-01 VITALS — BP 134/80 | HR 87 | Ht 60.0 in | Wt 122.2 lb

## 2022-11-01 DIAGNOSIS — Z7901 Long term (current) use of anticoagulants: Secondary | ICD-10-CM | POA: Diagnosis present

## 2022-11-01 DIAGNOSIS — I340 Nonrheumatic mitral (valve) insufficiency: Secondary | ICD-10-CM | POA: Diagnosis not present

## 2022-11-01 DIAGNOSIS — I480 Paroxysmal atrial fibrillation: Secondary | ICD-10-CM | POA: Diagnosis present

## 2022-11-01 DIAGNOSIS — I10 Essential (primary) hypertension: Secondary | ICD-10-CM | POA: Insufficient documentation

## 2022-11-01 NOTE — Patient Instructions (Signed)
Medication Instructions:  NO CHANGES  *If you need a refill on your cardiac medications before your next appointment, please call your pharmacy*    Follow-Up: At [facility], you and your health needs are our priority.  As part of our continuing mission to provide you with exceptional heart care, we have created designated Provider Care Teams.  These Care Teams include your primary Cardiologist (physician) and Advanced Practice Providers (APPs -  Physician Assistants and Nurse Practitioners) who all work together to provide you with the care you need, when you need it.  We recommend signing up for the patient portal called "MyChart".  Sign up information is provided on this After Visit Summary.  MyChart is used to connect with patients for Virtual Visits (Telemedicine).  Patients are able to view lab/test results, encounter notes, upcoming appointments, etc.  Non-urgent messages can be sent to your provider as well.   To learn more about what you can do with MyChart, go to [email or web address].    Your next appointment:    3 months with Dr. [place]

## 2022-11-08 ENCOUNTER — Telehealth: Payer: Self-pay

## 2022-11-08 MED ORDER — RIVAROXABAN 15 MG PO TABS: 15.0000 mg | ORAL_TABLET | Freq: Every day | ORAL | 3 refills | Status: DC

## 2022-11-08 NOTE — Telephone Encounter (Signed)
See previous 4/18 telephone note.

## 2022-11-08 NOTE — Telephone Encounter (Signed)
Received Xarelto patient assistance form.Form completed and faxed to [employer] at fax # [phone].

## 2022-12-14 ENCOUNTER — Other Ambulatory Visit: Payer: Self-pay | Admitting: Cardiology

## 2023-01-22 ENCOUNTER — Ambulatory Visit: Payer: Medicare Other | Admitting: Cardiology

## 2023-01-28 ENCOUNTER — Other Ambulatory Visit: Payer: Self-pay | Admitting: Cardiology

## 2023-01-28 DIAGNOSIS — I480 Paroxysmal atrial fibrillation: Secondary | ICD-10-CM

## 2023-01-28 DIAGNOSIS — I5032 Chronic diastolic (congestive) heart failure: Secondary | ICD-10-CM

## 2023-02-15 NOTE — Progress Notes (Signed)
Date:  02/19/2023   ID:  [name], Jones Feb 05, 1924, MRN [id number]  PCP:  Burton Apley, MD  Cardiologist:  Briyanna Billingham [place], MD  Electrophysiologist:  None   Evaluation Performed:  Follow-Up Visit  Chief Complaint:  HTN,Afib   History of Present Illness:    [name] Jones is a 89 y.o. female seen for follow up HTN and AFib. She has a history of HTN. In early November 2016 she was noted to have new onset atrial fibrillation. She was started on Xarelto. She deteriorated and was admitted with acute diastolic CHF. She was diuresed with IV lasix. She had hyponatremia. She had an Echo in May 2016 while in NSR showing Normal LV function with grade 2 diastolic dysfunction. There was moderate MR and mild to moderate TR. Mild pulmonary HTN. Repeat Echo in hospital showed normal LV function with severe MR and TR and mod to severe biatrial enlargement. She later underwent DCCV on 09/15/15. On follow up she was noted to be bradycardic with HR into low 40s. Her metoprolol and cardizem doses were reduced. Later cardizem discontinued. On prior visit she was still bradycardic so we stopped her Toprol XL and reduced amiodarone to 100 mg daily this was in Nov 2020.   On follow up today she is seen with her son. When we saw her in April she was in Afib with controlled rate. She does note that she is more SOB when she is active now. No swelling or change in weight. We did increase her hydralazine to TID and this has controlled her BP well based on her home monitor. Typically BP 120-140 with rare rise to 153 systolic.    Past Medical History:  Diagnosis Date   Atrial fibrillation (HCC)    Chronic diastolic CHF (congestive heart failure) (HCC)    Glaucoma    Hypertension    Hyponatremia    Iron deficiency anemia    Mitral insufficiency    TIA (transient ischemic attack) 1997   Tricuspid insufficiency    Past Surgical History:  Procedure Laterality Date   ABDOMINAL HYSTERECTOMY      CARDIOVERSION N/A 09/15/2015   Procedure: CARDIOVERSION;  Surgeon: Wendall Stade, MD;  Location: [facility] ENDOSCOPY;  Service: Cardiovascular;  Laterality: N/A;   CATARACT EXTRACTION     DIAGNOSTIC LAPAROSCOPY     STRABISMUS SURGERY     TONSILLECTOMY       Current Meds  Medication Sig   acetaminophen (TYLENOL) 325 MG tablet Take 650 mg by mouth at bedtime as needed for moderate pain.    ALPRAZolam (XANAX) 0.25 MG tablet Take 1/2 tablet daily if needed   calcium carbonate (OSCAL) 1500 (600 Ca) MG TABS tablet Take 600 mg daily   carteolol (OCUPRESS) 1 % ophthalmic solution Place 1 drop into both eyes 2 (two) times daily.   cholecalciferol (VITAMIN D) 1000 units tablet Take 1 tablet (1,000 Units total) by mouth daily.   erythromycin ophthalmic ointment Place into both eyes at bedtime.   furosemide (LASIX) 20 MG tablet TAKE 2 TABLETS EVERY MORNING AND TAKE 1 TABLET AT NOON   hydrALAZINE (APRESOLINE) 10 MG tablet Take 1 tablet (10 mg total) by mouth 3 (three) times daily.   latanoprost (XALATAN) 0.005 % ophthalmic solution Place 1 drop into both eyes at bedtime.   levothyroxine (SYNTHROID) 50 MCG tablet Take 50 mcg by mouth daily before breakfast.   metoprolol succinate (TOPROL-XL) 25 MG 24 hr tablet TAKE 1 TABLET BY MOUTH EVERY DAY  Multiple Vitamin (MULTIVITAMIN WITH MINERALS) TABS tablet Take 1 tablet by mouth every evening.    potassium chloride (KLOR-CON M10) 10 MEQ tablet TAKE 1 TABLET BY MOUTH TWICE A DAY   Probiotic Product (RA PROBIOTIC COLON CARE) CAPS Take 1 daily   Rivaroxaban (XARELTO) 15 MG TABS tablet Take 1 tablet (15 mg total) by mouth daily with supper.   vitamin C (ASCORBIC ACID) 500 MG tablet Take 500 mg by mouth daily with lunch.    [DISCONTINUED] amiodarone (PACERONE) 100 MG tablet TAKE 1 TABLET BY MOUTH EVERY DAY     Allergies:   Lisinopril   Social History   Tobacco Use   Smoking status: Never   Smokeless tobacco: Never  Vaping Use   Vaping status: Never Used   Substance Use Topics   Alcohol use: Yes    Alcohol/week: 0.0 standard drinks of alcohol    Comment: occasional   Drug use: No     Family Hx: The patient's family history includes Heart attack in her brother; Heart attack (age of onset: 15) in her son; Heart attack (age of onset: 70) in her father; Heart disease in her mother; Hypertension in her son; Ovarian cancer (age of onset: 41) in her mother.  ROS:   Please see the history of present illness.    All other systems reviewed and are negative.   Prior CV studies:   The following studies were reviewed today:  none  Labs/Other Tests and Data Reviewed:    EKG: not done  today     Recent Labs: No results found for requested labs within last 365 days.   Recent Lipid Panel Lab Results  Component Value Date/Time   CHOL 192 09/29/2018 10:40 AM   TRIG 121 09/29/2018 10:40 AM   HDL 73 09/29/2018 10:40 AM   CHOLHDL 2.6 09/29/2018 10:40 AM   CHOLHDL 3.0 06/08/2015 11:22 PM   LDLCALC 95 09/29/2018 10:40 AM   Labs reviewed from Dr Su Hilt in November 2021. Normal CMET and TSH. Hgb 11.1. LDL 106. Cholesterol 203. HDL 70. Reviewed labs from November 2022. CMET and CBC normal.  Wt Readings from Last 3 Encounters:  02/19/23 121 lb (54.9 kg)  11/01/22 122 lb 3.2 oz (55.4 kg)  08/13/22 121 lb 6.4 oz (55.1 kg)     Objective:    Vital Signs:  BP (!) 160/90   Pulse 60   Ht 5' (1.524 m)   Wt 121 lb (54.9 kg)   SpO2 98%   BMI 23.63 kg/m    GENERAL:  Well appearing, elderly WF in NAD HEENT:  PERRL, EOMI, sclera are clear. Oropharynx is clear. NECK:  No jugular venous distention, carotid upstroke brisk and symmetric, no bruits, no thyromegaly or adenopathy LUNGS:  Clear to auscultation bilaterally CHEST:  Unremarkable HEART:  IRRR,  PMI not displaced or sustained,S1 and S2 within normal limits, no S3, no S4: no clicks, no rubs, gr 2/6 systolic murmur at apex. ABD:  Soft, nontender. BS +, no masses or bruits. No hepatomegaly,  no splenomegaly EXT:  2 + pulses throughout, no edema, no cyanosis no clubbing SKIN:  Warm and dry.  No rashes NEURO:  Alert and oriented x 3. Cranial nerves II through XII intact. PSYCH:  Cognitively intact    ASSESSMENT & PLAN:    1.  Atrial fibrillation s/p DCCV in February 2017. Had maintained  NSR on low dose amiodarone but  back in Afib with controlled rate since April. Some SOB on exertion. We discussed  option of increasing amiodarone with DCCV to restore NSR vs just controlling rate. She is not in favor of DCCV so at this point will stop amiodarone and continue rate control with Toprol.  Continue  Xarelto (lower dose due to age and weight).  Will update labs today   2. Chronic diastolic CHF.  Volume status appears to be normal on current lasix dose.   3. Hyponatremia. check lab   4. Moderate to severe MR and TR.   Will treat medically. She is not a candidate for valve surgery.    5. Iron deficiency anemia. Check CBC   6. HTN- labile. BP looks good today.  Will continue hydralazine to 10 mg tid. Continue lasix and Toprol XL .     Follow up with APP in 4 months.  Medication Adjustments/Labs and Tests Ordered: Current medicines are reviewed at length with the patient today.  Concerns regarding medicines are outlined above.   Tests Ordered: No orders of the defined types were placed in this encounter.    Medication Changes: No orders of the defined types were placed in this encounter.   Signed, Lulubelle Simcoe [place], MD  02/19/2023 11:51 AM    [facility]

## 2023-02-19 ENCOUNTER — Ambulatory Visit: Payer: Medicare Other | Attending: Cardiology | Admitting: Cardiology

## 2023-02-19 ENCOUNTER — Encounter: Payer: Self-pay | Admitting: Cardiology

## 2023-02-19 VITALS — BP 160/90 | HR 60 | Ht 60.0 in | Wt 121.0 lb

## 2023-02-19 DIAGNOSIS — R0609 Other forms of dyspnea: Secondary | ICD-10-CM | POA: Diagnosis present

## 2023-02-19 DIAGNOSIS — I10 Essential (primary) hypertension: Secondary | ICD-10-CM | POA: Diagnosis present

## 2023-02-19 DIAGNOSIS — I4819 Other persistent atrial fibrillation: Secondary | ICD-10-CM | POA: Diagnosis present

## 2023-02-19 DIAGNOSIS — I5032 Chronic diastolic (congestive) heart failure: Secondary | ICD-10-CM | POA: Insufficient documentation

## 2023-02-19 DIAGNOSIS — I340 Nonrheumatic mitral (valve) insufficiency: Secondary | ICD-10-CM | POA: Diagnosis present

## 2023-02-19 DIAGNOSIS — Z7901 Long term (current) use of anticoagulants: Secondary | ICD-10-CM | POA: Diagnosis present

## 2023-02-19 MED ORDER — RIVAROXABAN 15 MG PO TABS: 15.0000 mg | ORAL_TABLET | Freq: Two times a day (BID) | ORAL | 0 refills | Status: DC

## 2023-02-19 NOTE — Patient Instructions (Signed)
Medication Instructions:  Stop Amiodarone Continue all other medications *If you need a refill on your cardiac medications before your next appointment, please call your pharmacy*   Lab Work: Cbc,cmet,tsh,bnp today   Testing/Procedures: None ordered   Follow-Up: At [facility], you and your health needs are our priority.  As part of our continuing mission to provide you with exceptional heart care, we have created designated Provider Care Teams.  These Care Teams include your primary Cardiologist (physician) and Advanced Practice Providers (APPs -  Physician Assistants and Nurse Practitioners) who all work together to provide you with the care you need, when you need it.  We recommend signing up for the patient portal called "MyChart".  Sign up information is provided on this After Visit Summary.  MyChart is used to connect with patients for Virtual Visits (Telemedicine).  Patients are able to view lab/test results, encounter notes, upcoming appointments, etc.  Non-urgent messages can be sent to your provider as well.   To learn more about what you can do with MyChart, go to [email or web address].    Your next appointment:  4 months    Provider:  Dr.Jordan's PA

## 2023-02-19 NOTE — Addendum Note (Signed)
Addended by: Neoma Laming on: 02/19/2023 03:23 PM   Modules accepted: Orders

## 2023-03-11 ENCOUNTER — Other Ambulatory Visit: Payer: Self-pay | Admitting: Cardiology

## 2023-04-05 ENCOUNTER — Ambulatory Visit: Payer: Medicare Other | Admitting: Cardiology

## 2023-05-06 ENCOUNTER — Other Ambulatory Visit: Payer: Self-pay | Admitting: Cardiology

## 2023-05-06 ENCOUNTER — Telehealth: Payer: Self-pay | Admitting: Cardiology

## 2023-05-06 NOTE — Telephone Encounter (Signed)
Patient states since stopping amiodarone she feels her SOB and her BP has been elevated. She kept saying she can not hear and her son got on the phone and stated that's whys he wants to talk to Elnita Maxwell she knows my mom well and can you send her a message to [place] tomorrow.

## 2023-05-06 NOTE — Telephone Encounter (Signed)
Patient called to talk with Dr. [place] or nurse Anabel Halon.

## 2023-05-08 NOTE — Telephone Encounter (Signed)
Spoke to patient's son [name] he stated mother is feeling better.SOB is no worse.B/P has been ranging 148/86,152/98,127/78,146/96,136/79.Pulse [phone].She will continue all medications as prescribed.She will keep appointment with Dr.Jordan 11/4 at 4:00 pm.She will call sooner if needed.

## 2023-05-08 NOTE — Telephone Encounter (Signed)
Called patient's son left message on personal voice mail to call back.

## 2023-05-08 NOTE — Telephone Encounter (Signed)
Patient is returning call and is requesting call back.  

## 2023-05-16 NOTE — Progress Notes (Signed)
Date:  05/27/2023   ID:  Ahuva, Poynor Feb 25, 1924, MRN [id number]  PCP:  Burton Apley, MD  Cardiologist:  Isa Kohlenberg [place], MD  Electrophysiologist:  None   Evaluation Performed:  Follow-Up Visit  Chief Complaint:  HTN,Afib   History of Present Illness:    [name] Jones is a 89 y.o. female seen for follow up HTN and AFib. She has a history of HTN. In early November 2016 she was noted to have new onset atrial fibrillation. She was started on Xarelto. She deteriorated and was admitted with acute diastolic CHF. She was diuresed with IV lasix. She had hyponatremia. She had an Echo in May 2016 while in NSR showing Normal LV function with grade 2 diastolic dysfunction. There was moderate MR and mild to moderate TR. Mild pulmonary HTN. Repeat Echo in hospital showed normal LV function with severe MR and TR and mod to severe biatrial enlargement. She later underwent DCCV on 09/15/15. On follow up she was noted to be bradycardic with HR into low 40s. Her metoprolol and cardizem doses were reduced. Later cardizem discontinued. On prior visit she was still bradycardic so we stopped her Toprol XL and reduced amiodarone to 100 mg daily this was in Nov 2020.   On follow up today she is seen with her son. She does note that she is more SOB when she is active now. No swelling or change in weight. We did increase her hydralazine to TID and this has controlled her BP well based on her home monitor. Typically BP 120-140 with rare rise to 153 systolic. She does have a chronic dry cough worse in the morning or at night.     Past Medical History:  Diagnosis Date   Atrial fibrillation (HCC)    Chronic diastolic CHF (congestive heart failure) (HCC)    Glaucoma    Hypertension    Hyponatremia    Iron deficiency anemia    Mitral insufficiency    TIA (transient ischemic attack) 1997   Tricuspid insufficiency    Past Surgical History:  Procedure Laterality Date   ABDOMINAL HYSTERECTOMY      CARDIOVERSION N/A 09/15/2015   Procedure: CARDIOVERSION;  Surgeon: Wendall Stade, MD;  Location: [facility] ENDOSCOPY;  Service: Cardiovascular;  Laterality: N/A;   CATARACT EXTRACTION     DIAGNOSTIC LAPAROSCOPY     STRABISMUS SURGERY     TONSILLECTOMY       Current Meds  Medication Sig   acetaminophen (TYLENOL) 325 MG tablet Take 650 mg by mouth at bedtime as needed for moderate pain.    ALPRAZolam (XANAX) 0.25 MG tablet Take 1/2 tablet daily if needed   calcium carbonate (OSCAL) 1500 (600 Ca) MG TABS tablet Take 600 mg daily   carteolol (OCUPRESS) 1 % ophthalmic solution Place 1 drop into both eyes 2 (two) times daily.   cholecalciferol (VITAMIN D) 1000 units tablet Take 1 tablet (1,000 Units total) by mouth daily.   erythromycin ophthalmic ointment Place into both eyes at bedtime.   furosemide (LASIX) 20 MG tablet Take 1 tablet (20 mg total) by mouth 2 (two) times daily.   hydrALAZINE (APRESOLINE) 10 MG tablet Take 1 tablet (10 mg total) by mouth 3 (three) times daily.   KLOR-CON M10 10 MEQ tablet TAKE 1 TABLET BY MOUTH TWICE A DAY   latanoprost (XALATAN) 0.005 % ophthalmic solution Place 1 drop into both eyes at bedtime.   levothyroxine (SYNTHROID) 50 MCG tablet Take 50 mcg by mouth daily before  breakfast.   Menthol 5 % PTCH Apply 1 patch topically every morning. Apply to left knee at 8AM and removed qhs   metoprolol succinate (TOPROL-XL) 25 MG 24 hr tablet TAKE 1 TABLET BY MOUTH EVERY DAY   Multiple Vitamin (MULTIVITAMIN WITH MINERALS) TABS tablet Take 1 tablet by mouth every evening.    Probiotic Product (RA PROBIOTIC COLON CARE) CAPS Take 1 daily   Rivaroxaban (XARELTO) 15 MG TABS tablet Take 1 tablet (15 mg total) by mouth daily with supper.   Rivaroxaban (XARELTO) 15 MG TABS tablet Take 1 tablet (15 mg total) by mouth 2 (two) times daily with a meal.   vitamin C (ASCORBIC ACID) 500 MG tablet Take 500 mg by mouth daily with lunch.      Allergies:   Lisinopril   Social History    Tobacco Use   Smoking status: Never   Smokeless tobacco: Never  Vaping Use   Vaping status: Never Used  Substance Use Topics   Alcohol use: Yes    Alcohol/week: 0.0 standard drinks of alcohol    Comment: occasional   Drug use: No     Family Hx: The patient's family history includes Heart attack in her brother; Heart attack (age of onset: 68) in her son; Heart attack (age of onset: 38) in her father; Heart disease in her mother; Hypertension in her son; Ovarian cancer (age of onset: 48) in her mother.  ROS:   Please see the history of present illness.    All other systems reviewed and are negative.   Prior CV studies:   The following studies were reviewed today:  none  Labs/Other Tests and Data Reviewed:    EKG: not done  today     Recent Labs: 02/19/2023: ALT 29; BNP 216.0; BUN 16; Creatinine, Ser 0.79; Hemoglobin 12.3; Platelets 204; Potassium 4.7; Sodium 131; TSH 1.850   Recent Lipid Panel Lab Results  Component Value Date/Time   CHOL 192 09/29/2018 10:40 AM   TRIG 121 09/29/2018 10:40 AM   HDL 73 09/29/2018 10:40 AM   CHOLHDL 2.6 09/29/2018 10:40 AM   CHOLHDL 3.0 06/08/2015 11:22 PM   LDLCALC 95 09/29/2018 10:40 AM   Labs reviewed from Dr Su Hilt in November 2021. Normal CMET and TSH. Hgb 11.1. LDL 106. Cholesterol 203. HDL 70. Reviewed labs from November 2022. CMET and CBC normal.  Wt Readings from Last 3 Encounters:  05/27/23 118 lb (53.5 kg)  02/19/23 121 lb (54.9 kg)  11/01/22 122 lb 3.2 oz (55.4 kg)     Objective:    Vital Signs:  BP 120/62 (BP Location: Left Arm, Patient Position: Sitting, Cuff Size: Normal)   Pulse 62   Ht 5' (1.524 m)   Wt 118 lb (53.5 kg)   SpO2 (!) 86%   BMI 23.05 kg/m    GENERAL:  Well appearing, elderly WF in NAD HEENT:  PERRL, EOMI, sclera are clear. Oropharynx is clear. NECK:  No jugular venous distention, carotid upstroke brisk and symmetric, no bruits, no thyromegaly or adenopathy LUNGS:  Clear to auscultation  bilaterally CHEST:  Unremarkable HEART:  IRRR,  PMI not displaced or sustained,S1 and S2 within normal limits, no S3, no S4: no clicks, no rubs, gr 2/6 systolic murmur at apex. ABD:  Soft, nontender. BS +, no masses or bruits. No hepatomegaly, no splenomegaly EXT:  2 + pulses throughout, no edema, no cyanosis no clubbing SKIN:  Warm and dry.  No rashes NEURO:  Alert and oriented x 3. Cranial nerves  II through XII intact. PSYCH:  Cognitively intact    ASSESSMENT & PLAN:    1.  Atrial fibrillation s/p DCCV in February 2017. Had maintained  NSR on low dose amiodarone but  back in Afib with controlled rate since April. Will continue rate control strategy with beta blocker. Off amiodarone now.  Continue  Xarelto (lower dose due to age and weight).     2. Chronic diastolic CHF.  Volume status appears to be normal on current lasix dose.   3. Hyponatremia.    4. Moderate to severe MR and TR.   Will treat medically. She is not a candidate for valve surgery.    5. Iron deficiency anemia. Check CBC   6. HTN- labile. BP looks good today.  Will continue hydralazine to 10 mg tid. Continue lasix and Toprol XL .  7. Chronic cough. Will try omeprazole and cough medication first.      Follow up with APP in 4 months.  Medication Adjustments/Labs and Tests Ordered: Current medicines are reviewed at length with the patient today.  Concerns regarding medicines are outlined above.   Tests Ordered: No orders of the defined types were placed in this encounter.    Medication Changes: No orders of the defined types were placed in this encounter.   Signed, Quintina Hakeem [place], MD  05/27/2023 4:41 PM    [facility]

## 2023-05-27 ENCOUNTER — Encounter: Payer: Self-pay | Admitting: Cardiology

## 2023-05-27 ENCOUNTER — Ambulatory Visit: Payer: Medicare Other | Attending: Cardiology | Admitting: Cardiology

## 2023-05-27 VITALS — BP 120/62 | HR 62 | Ht 60.0 in | Wt 118.0 lb

## 2023-05-27 DIAGNOSIS — R0609 Other forms of dyspnea: Secondary | ICD-10-CM | POA: Insufficient documentation

## 2023-05-27 DIAGNOSIS — I10 Essential (primary) hypertension: Secondary | ICD-10-CM | POA: Diagnosis present

## 2023-05-27 DIAGNOSIS — I4819 Other persistent atrial fibrillation: Secondary | ICD-10-CM | POA: Insufficient documentation

## 2023-05-27 DIAGNOSIS — I5032 Chronic diastolic (congestive) heart failure: Secondary | ICD-10-CM | POA: Insufficient documentation

## 2023-05-27 MED ORDER — RIVAROXABAN 15 MG PO TABS: 15.0000 mg | ORAL_TABLET | Freq: Two times a day (BID) | ORAL | Status: DC

## 2023-05-27 NOTE — Patient Instructions (Signed)
Medication Instructions:  No change  *If you need a refill on your cardiac medications before your next appointment, please call your pharmacy*   Lab Work: None     Testing/Procedures: None    Follow-Up: At [facility], you and your health needs are our priority.  As part of our continuing mission to provide you with exceptional heart care, we have created designated Provider Care Teams.  These Care Teams include your primary Cardiologist (physician) and Advanced Practice Providers (APPs -  Physician Assistants and Nurse Practitioners) who all work together to provide you with the care you need, when you need it.   Your next appointment:   6 month(s)  Provider:   Peter [place], MD     Other Instructions Try taking OTC cough syrup

## 2023-05-27 NOTE — Addendum Note (Signed)
Addended by: Neoma Laming on: 05/27/2023 05:04 PM   Modules accepted: Orders

## 2023-05-29 ENCOUNTER — Telehealth: Payer: Self-pay | Admitting: Cardiology

## 2023-05-29 MED ORDER — FUROSEMIDE 20 MG PO TABS: ORAL_TABLET | ORAL | 3 refills | Status: DC

## 2023-05-29 NOTE — Telephone Encounter (Signed)
Called and spoke to patient  Patient states:   -Takes two 20mg  lasix in the morning and one 20mg  lasix at lunch   -she has been taking Lasix like this since 2016  -noticed at 11/4 visit, medication list did not match how she is taking lasix  -her current prescription is for Lasix 20mg  1 tablet BID Patient requesting clarification on dose and instruction for Lasix Rx  Informed patient message sent to Dr. [place] for input/advisement  Patient agrees with plan, no questions at this time    Dr. [place]  Please clarify which dose/instructions for Lasix patient should be following   Thank you

## 2023-05-29 NOTE — Telephone Encounter (Signed)
Pt c/o medication issue:  1. Name of Medication:    2. How are you currently taking this medication (dosage and times per day)? Take 20 mg by mouth 2 (two) times daily. Take 40 mg in the morning and 20 mg at noon   3. Are you having a reaction (difficulty breathing--STAT)? No  4. What is your medication issue? Patient would like clarification on her medication.

## 2023-05-29 NOTE — Telephone Encounter (Signed)
Received message from Dr. [place]:   I would continue taking it as she has been doing for years. Please correct dosing in medical record to reflect this   Peter [place] MD, [facility]   _____________________________________________________________ [name] Jones and spoke to patient  Relayed provider message  Advised patient to continue taking Rx as previously (two in the morning, one in afternoon) Informed patient prescription update sent to pharmacy  Patient verbalized understanding, no questions at this time

## 2023-07-09 ENCOUNTER — Other Ambulatory Visit: Payer: Self-pay | Admitting: Cardiology

## 2023-07-09 NOTE — Telephone Encounter (Signed)
Prescription refill request for Xarelto received.  Indication: PAF Last office visit: 05/27/23 P [place] MD Weight: 53.5kg Age: 89 Scr: 0.79 on 02/19/23  Epic CrCl: 34.38  Based on above findings Xarelto 15mg  daily is the appropriate dose.  Refill approved.

## 2023-08-14 ENCOUNTER — Other Ambulatory Visit: Payer: Self-pay | Admitting: Cardiology

## 2023-09-12 ENCOUNTER — Other Ambulatory Visit: Payer: Self-pay | Admitting: Cardiology

## 2023-11-30 NOTE — Progress Notes (Unsigned)
 Date:  12/04/2023   ID:  [name], Jones 1923/07/28, MRN [id number]  PCP:  [name] Ghee, MD  Cardiologist:  [name] Goatley [place], MD  Electrophysiologist:  None   Evaluation Performed:  Follow-Up Visit  Chief Complaint:  HTN,Afib   History of Present Illness:    [name] Jones is a 89 y.o. female seen for follow up HTN and AFib. She has a history of HTN. In early November 2016 she was noted to have new onset atrial fibrillation. She was started on Xarelto . She deteriorated and was admitted with acute diastolic CHF. She was diuresed with IV lasix . She had hyponatremia. She had an Echo in May 2016 while in NSR showing Normal LV function with grade 2 diastolic dysfunction. There was moderate MR and mild to moderate TR. Mild pulmonary HTN. Repeat Echo in hospital showed normal LV function with severe MR and TR and mod to severe biatrial enlargement. She later underwent DCCV on 09/15/15. On follow up she was noted to be bradycardic with HR into low 40s. Her metoprolol  and cardizem  doses were reduced. Later cardizem  discontinued. On prior visit she was still bradycardic so we stopped her Toprol  XL and reduced amiodarone  to 100 mg daily this was in Nov 2020.   On follow up today she is seen with her son. She is doing well. Has chronic SOB but states this really hasn't changed.  No swelling or change in weight. BP is good.     Past Medical History:  Diagnosis Date   Atrial fibrillation (HCC)    Chronic diastolic CHF (congestive heart failure) (HCC)    Glaucoma    Hypertension    Hyponatremia    Iron deficiency anemia    Mitral insufficiency    TIA (transient ischemic attack) 1997   Tricuspid insufficiency    Past Surgical History:  Procedure Laterality Date   ABDOMINAL HYSTERECTOMY     CARDIOVERSION N/A 09/15/2015   Procedure: CARDIOVERSION;  Surgeon: Loyde Rule, MD;  Location: [facility] ENDOSCOPY;  Service: Cardiovascular;  Laterality: N/A;   CATARACT EXTRACTION     DIAGNOSTIC  LAPAROSCOPY     STRABISMUS SURGERY     TONSILLECTOMY       Current Meds  Medication Sig   acetaminophen  (TYLENOL ) 325 MG tablet Take 650 mg by mouth at bedtime as needed for moderate pain.    ALPRAZolam (XANAX) 0.25 MG tablet Take 1/2 tablet daily if needed   calcium  carbonate (OSCAL) 1500 (600 Ca) MG TABS tablet Take 600 mg daily   carteolol (OCUPRESS) 1 % ophthalmic solution Place 1 drop into both eyes 2 (two) times daily.   cholecalciferol (VITAMIN D) 1000 units tablet Take 1 tablet (1,000 Units total) by mouth daily.   erythromycin ophthalmic ointment Place into both eyes at bedtime.   furosemide  (LASIX ) 20 MG tablet Take two 20mg  tablets in the morning and one 20mg  tablet in the evening   hydrALAZINE  (APRESOLINE ) 10 MG tablet TAKE 1 TABLET BY MOUTH THREE TIMES A DAY   latanoprost  (XALATAN ) 0.005 % ophthalmic solution Place 1 drop into both eyes at bedtime.   levothyroxine  (SYNTHROID ) 50 MCG tablet Take 50 mcg by mouth daily before breakfast.   Menthol 5 % PTCH Apply 1 patch topically every morning. Apply to left knee at 8AM and removed qhs   metoprolol  succinate (TOPROL -XL) 25 MG 24 hr tablet TAKE 1 TABLET BY MOUTH EVERY DAY   Multiple Vitamin (MULTIVITAMIN WITH MINERALS) TABS tablet Take 1 tablet by mouth every  evening.    potassium chloride  (KLOR-CON  M10) 10 MEQ tablet TAKE 1 TABLET BY MOUTH TWICE A DAY   Probiotic Product (RA PROBIOTIC COLON CARE) CAPS Take 1 daily   Rivaroxaban  (XARELTO ) 15 MG TABS tablet Take 1 tablet (15 mg total) by mouth daily with supper.   vitamin C  (ASCORBIC ACID ) 500 MG tablet Take 500 mg by mouth daily with lunch.    [DISCONTINUED] Rivaroxaban  (XARELTO ) 15 MG TABS tablet Take 1 tablet (15 mg total) by mouth 2 (two) times daily with a meal.   [DISCONTINUED] Rivaroxaban  (XARELTO ) 15 MG TABS tablet Take 1 tablet (15 mg total) by mouth daily with supper.   [DISCONTINUED] XARELTO  15 MG TABS tablet TAKE 1 TABLET BY MOUTH WITH SUPPER     Allergies:    Lisinopril    Social History   Tobacco Use   Smoking status: Never   Smokeless tobacco: Never  Vaping Use   Vaping status: Never Used  Substance Use Topics   Alcohol use: Yes    Alcohol/week: 0.0 standard drinks of alcohol    Comment: occasional   Drug use: No     Family Hx: The patient's family history includes Heart attack in her brother; Heart attack (age of onset: 67) in her son; Heart attack (age of onset: 8) in her father; Heart disease in her mother; Hypertension in her son; Ovarian cancer (age of onset: 44) in her mother.  ROS:   Please see the history of present illness.    All other systems reviewed and are negative.   Prior CV studies:   The following studies were reviewed today:  none  Labs/Other Tests and Data Reviewed:    EKG Interpretation Date/Time:  Wednesday Dec 04 2023 16:30:45 EDT Ventricular Rate:  76 PR Interval:    QRS Duration:  128 QT Interval:  394 QTC Calculation: 443 R Axis:   11  Text Interpretation: Atrial fibrillation Right bundle branch block When compared with ECG of November 01, 2022 RBBB is new Confirmed by Jones, Akera Snowberger [phone]) on 12/04/2023 4:33:47 PM    Recent Labs: 02/19/2023: ALT 29; BNP 216.0; BUN 16; Creatinine, Ser 0.79; Hemoglobin 12.3; Platelets 204; Potassium 4.7; Sodium 131; TSH 1.850   Recent Lipid Panel Lab Results  Component Value Date/Time   CHOL 192 09/29/2018 10:40 AM   TRIG 121 09/29/2018 10:40 AM   HDL 73 09/29/2018 10:40 AM   CHOLHDL 2.6 09/29/2018 10:40 AM   CHOLHDL 3.0 06/08/2015 11:22 PM   LDLCALC 95 09/29/2018 10:40 AM   Labs reviewed from Dr Adelene Homer in November 2021. Normal CMET and TSH. Hgb 11.1. LDL 106. Cholesterol 203. HDL 70. Reviewed labs from November 2022. CMET and CBC normal.  Wt Readings from Last 3 Encounters:  12/04/23 116 lb (52.6 kg)  05/27/23 118 lb (53.5 kg)  02/19/23 121 lb (54.9 kg)     Objective:    Vital Signs:  BP 127/73 (Cuff Size: Small)   Pulse 79   Ht 5' (1.524 m)    Wt 116 lb (52.6 kg)   SpO2 96%   BMI 22.65 kg/m    GENERAL:  Well appearing, elderly WF in NAD HEENT:  PERRL, EOMI, sclera are clear. Oropharynx is clear. NECK:  No jugular venous distention, carotid upstroke brisk and symmetric, no bruits, no thyromegaly or adenopathy LUNGS:  Clear to auscultation bilaterally CHEST:  Unremarkable HEART:  IRRR,  PMI not displaced or sustained,S1 and S2 within normal limits, no S3, no S4: no clicks, no rubs, gr 2/6  systolic murmur at apex. ABD:  Soft, nontender. BS +, no masses or bruits. No hepatomegaly, no splenomegaly EXT:  2 + pulses throughout, no edema, no cyanosis no clubbing SKIN:  Warm and dry.  No rashes NEURO:  Alert and oriented x 3. Cranial nerves II through XII intact. PSYCH:  Cognitively intact    ASSESSMENT & PLAN:    1.  Atrial fibrillation s/p DCCV in February 2017. Had maintained  NSR on low dose amiodarone  but  back in Afib with controlled rate since April. Will continue rate control strategy with beta blocker.  Continue  Xarelto  (lower dose due to age and weight).     2. Chronic diastolic CHF.  Volume status appears to be normal on current lasix  dose.   3. Hyponatremia. reports labs with Dr Adelene Homer in Dec were OK.    4. Moderate to severe MR and TR.   Will treat medically. She is not a candidate for valve surgery.    5. Iron deficiency anemia. Check CBC   6. HTN- labile. BP looks good today.      Follow up 6 months  Medication Adjustments/Labs and Tests Ordered: Current medicines are reviewed at length with the patient today.  Concerns regarding medicines are outlined above.   Tests Ordered: Orders Placed This Encounter  Procedures   EKG 12-Lead     Medication Changes: No orders of the defined types were placed in this encounter.   Signed, Kase Shughart [place], MD  12/04/2023 4:57 PM    [facility]

## 2023-12-04 ENCOUNTER — Ambulatory Visit: Payer: Medicare Other | Attending: Cardiology | Admitting: Cardiology

## 2023-12-04 ENCOUNTER — Encounter: Payer: Self-pay | Admitting: Cardiology

## 2023-12-04 ENCOUNTER — Telehealth: Payer: Self-pay | Admitting: *Deleted

## 2023-12-04 VITALS — BP 127/73 | HR 79 | Ht 60.0 in | Wt 116.0 lb

## 2023-12-04 DIAGNOSIS — I10 Essential (primary) hypertension: Secondary | ICD-10-CM | POA: Insufficient documentation

## 2023-12-04 DIAGNOSIS — I5032 Chronic diastolic (congestive) heart failure: Secondary | ICD-10-CM | POA: Insufficient documentation

## 2023-12-04 DIAGNOSIS — I4819 Other persistent atrial fibrillation: Secondary | ICD-10-CM | POA: Insufficient documentation

## 2023-12-04 MED ORDER — RIVAROXABAN 15 MG PO TABS: 15.0000 mg | ORAL_TABLET | Freq: Every day | ORAL | 0 refills | Status: DC

## 2023-12-04 NOTE — Telephone Encounter (Signed)
 Dr. Christophe Cram covering nurse reached out to resource nursing about this pt needing Xarelto  15 mg po daily with supper.  Pt will get this at today's office visit appt with Dr. [place].   3 bottles of Xarelto  15 mg po daily with supper were provided to Dr. Christophe Cram nurse to provide to the pt today.  (LOT- [id number] Exp-08/2024 for all 3 bottles).

## 2023-12-04 NOTE — Patient Instructions (Signed)
 Medication Instructions:  Continue same medications *If you need a refill on your cardiac medications before your next appointment, please call your pharmacy*  Lab Work: None ordered  Testing/Procedures: None ordered  Follow-Up: At [facility], you and your health needs are our priority.  As part of our continuing mission to provide you with exceptional heart care, our providers are all part of one team.  This team includes your primary Cardiologist (physician) and Advanced Practice Providers or APPs (Physician Assistants and Nurse Practitioners) who all work together to provide you with the care you need, when you need it.  Your next appointment:  6 months   Call in July to schedule Nov appointment     Provider:  Dr.Jordan   We recommend signing up for the patient portal called "MyChart".  Sign up information is provided on this After Visit Summary.  MyChart is used to connect with patients for Virtual Visits (Telemedicine).  Patients are able to view lab/test results, encounter notes, upcoming appointments, etc.  Non-urgent messages can be sent to your provider as well.   To learn more about what you can do with MyChart, go to [email or web address].

## 2024-02-24 ENCOUNTER — Other Ambulatory Visit: Payer: Self-pay | Admitting: Cardiology

## 2024-04-27 ENCOUNTER — Encounter (HOSPITAL_BASED_OUTPATIENT_CLINIC_OR_DEPARTMENT_OTHER): Payer: Self-pay | Admitting: Urology

## 2024-04-27 ENCOUNTER — Other Ambulatory Visit: Payer: Self-pay

## 2024-04-27 DIAGNOSIS — E871 Hypo-osmolality and hyponatremia: Secondary | ICD-10-CM | POA: Diagnosis not present

## 2024-04-27 DIAGNOSIS — R041 Hemorrhage from throat: Secondary | ICD-10-CM | POA: Diagnosis present

## 2024-04-27 DIAGNOSIS — S01502A Unspecified open wound of oral cavity, initial encounter: Secondary | ICD-10-CM | POA: Insufficient documentation

## 2024-04-27 DIAGNOSIS — X58XXXA Exposure to other specified factors, initial encounter: Secondary | ICD-10-CM | POA: Insufficient documentation

## 2024-04-27 DIAGNOSIS — I10 Essential (primary) hypertension: Secondary | ICD-10-CM | POA: Insufficient documentation

## 2024-04-27 DIAGNOSIS — Z7901 Long term (current) use of anticoagulants: Secondary | ICD-10-CM | POA: Insufficient documentation

## 2024-04-27 DIAGNOSIS — I4891 Unspecified atrial fibrillation: Secondary | ICD-10-CM | POA: Diagnosis not present

## 2024-04-27 LAB — CBC WITH DIFFERENTIAL/PLATELET
Abs Immature Granulocytes: 0.02 K/uL (ref 0.00–0.07)
Basophils Absolute: 0 K/uL (ref 0.0–0.1)
Basophils Relative: 1 %
Eosinophils Absolute: 0.2 K/uL (ref 0.0–0.5)
Eosinophils Relative: 3 %
HCT: 37.2 % (ref 36.0–46.0)
Hemoglobin: 13.1 g/dL (ref 12.0–15.0)
Immature Granulocytes: 0 %
Lymphocytes Relative: 12 %
Lymphs Abs: 1 K/uL (ref 0.7–4.0)
MCH: 32.3 pg (ref 26.0–34.0)
MCHC: 35.2 g/dL (ref 30.0–36.0)
MCV: 91.6 fL (ref 80.0–100.0)
Monocytes Absolute: 0.7 K/uL (ref 0.1–1.0)
Monocytes Relative: 8 %
Neutro Abs: 6.3 K/uL (ref 1.7–7.7)
Neutrophils Relative %: 76 %
Platelets: 205 K/uL (ref 150–400)
RBC: 4.06 MIL/uL (ref 3.87–5.11)
RDW: 13.1 % (ref 11.5–15.5)
WBC: 8.3 K/uL (ref 4.0–10.5)
nRBC: 0 % (ref 0.0–0.2)

## 2024-04-27 LAB — COMPREHENSIVE METABOLIC PANEL WITH GFR
ALT: 26 U/L (ref 0–44)
AST: 37 U/L (ref 15–41)
Albumin: 4.3 g/dL (ref 3.5–5.0)
Alkaline Phosphatase: 135 U/L — ABNORMAL HIGH (ref 38–126)
Anion gap: 14 (ref 5–15)
BUN: 18 mg/dL (ref 8–23)
CO2: 23 mmol/L (ref 22–32)
Calcium: 9.9 mg/dL (ref 8.9–10.3)
Chloride: 92 mmol/L — ABNORMAL LOW (ref 98–111)
Creatinine, Ser: 0.89 mg/dL (ref 0.44–1.00)
GFR, Estimated: 58 mL/min — ABNORMAL LOW (ref >60)
Glucose, Bld: 105 mg/dL — ABNORMAL HIGH (ref 70–99)
Potassium: 4.3 mmol/L (ref 3.5–5.1)
Sodium: 130 mmol/L — ABNORMAL LOW (ref 135–145)
Total Bilirubin: 0.5 mg/dL (ref 0.0–1.2)
Total Protein: 8.1 g/dL (ref 6.5–8.1)

## 2024-04-27 NOTE — ED Triage Notes (Signed)
 Per EMS, pt spitting up blood intermittently x 2 hours  Slight cough  Bleeding coming from back of throat, gets nosebleeds with HTN, no recent falls, pt is on xarelto   162/102 on the truck all other VS wnl

## 2024-04-28 ENCOUNTER — Emergency Department (HOSPITAL_BASED_OUTPATIENT_CLINIC_OR_DEPARTMENT_OTHER)
Admission: EM | Admit: 2024-04-28 | Discharge: 2024-04-28 | Disposition: A | Discharge status: Home / Self Care | Attending: Emergency Medicine | Admitting: Emergency Medicine

## 2024-04-28 DIAGNOSIS — S01502A Unspecified open wound of oral cavity, initial encounter: Secondary | ICD-10-CM | POA: Diagnosis not present

## 2024-04-28 DIAGNOSIS — I10 Essential (primary) hypertension: Secondary | ICD-10-CM

## 2024-04-28 MED ORDER — LABETALOL HCL 5 MG/ML IV SOLN
20.0000 mg | Freq: Once | INTRAVENOUS | Status: AC
  Administered 2024-04-28: 20 mg via INTRAVENOUS
  Filled 2024-04-28: qty 4

## 2024-04-28 NOTE — ED Provider Notes (Signed)
 [place] EMERGENCY DEPARTMENT AT [facility]  Provider Note  CSN: [id number] Arrival date & time: 04/27/24 2309  History Chief Complaint  Patient presents with   Mouth Bleeding     [name] Jones is a 89 y.o. female with history of afib, HTN, on Xarelto , brought to the ED via EMS for mouth bleeding. She noticed blood in her saliva earlier this evening, sometimes having to clear her throat, but no nose bleeding, hemoptysis or hematemesis. BP was elevated with EMS.    Home Medications Prior to Admission medications   Medication Sig Start Date End Date Taking? Authorizing Provider  acetaminophen  (TYLENOL ) 325 MG tablet Take 650 mg by mouth at bedtime as needed for moderate pain.     [provider]  ALPRAZolam (XANAX) 0.25 MG tablet Take 1/2 tablet daily if needed 07/08/18   [place], Peter M, MD  calcium  carbonate (OSCAL) 1500 (600 Ca) MG TABS tablet Take 600 mg daily 07/08/18   [place], Peter M, MD  carteolol (OCUPRESS) 1 % ophthalmic solution Place 1 drop into both eyes 2 (two) times daily.    [provider]  cholecalciferol (VITAMIN D) 1000 units tablet Take 1 tablet (1,000 Units total) by mouth daily. 07/08/18   [place], Peter M, MD  erythromycin ophthalmic ointment Place into both eyes at bedtime. 01/29/22   [provider]  furosemide  (LASIX ) 20 MG tablet Take two 20mg  tablets in the morning and one 20mg  tablet in the evening 05/29/23   [place], Peter M, MD  hydrALAZINE  (APRESOLINE ) 10 MG tablet TAKE 1 TABLET BY MOUTH THREE TIMES A DAY 08/15/23   [place], Peter M, MD  latanoprost  (XALATAN ) 0.005 % ophthalmic solution Place 1 drop into both eyes at bedtime.    [provider]  levothyroxine  (SYNTHROID ) 50 MCG tablet Take 50 mcg by mouth daily before breakfast. 06/17/18   [place], Peter M, MD  Menthol 5 % PTCH Apply 1 patch topically every morning. Apply to left knee at 8AM and removed qhs    [provider]  metoprolol  succinate  (TOPROL -XL) 25 MG 24 hr tablet TAKE 1 TABLET BY MOUTH EVERY DAY 02/27/24   [place], Peter M, MD  Multiple Vitamin (MULTIVITAMIN WITH MINERALS) TABS tablet Take 1 tablet by mouth every evening.     [provider]  potassium chloride  (KLOR-CON  M10) 10 MEQ tablet TAKE 1 TABLET BY MOUTH TWICE A DAY 09/12/23   [place], Peter M, MD  Probiotic Product (RA PROBIOTIC COLON CARE) CAPS Take 1 daily 07/08/18   [place], Peter M, MD  Rivaroxaban  (XARELTO ) 15 MG TABS tablet Take 1 tablet (15 mg total) by mouth daily with supper. 11/08/22   [place], Peter M, MD  vitamin C  (ASCORBIC ACID ) 500 MG tablet Take 500 mg by mouth daily with lunch.     [provider]  furosemide  (LASIX ) 20 MG tablet Take 20 mg by mouth 2 (two) times daily. Take 40 mg in the morning and 20 mg at noon         Allergies    Lisinopril    Review of Systems   Review of Systems Please see HPI for pertinent positives and negatives  Physical Exam BP (!) 148/80   Pulse 81   Temp 97.7 F (36.5 C)   Resp 19   Ht 5' (1.524 m)   Wt 52.6 kg   SpO2 97%   BMI 22.65 kg/m   Physical Exam Vitals and nursing note reviewed.  Constitutional:  Appearance: Normal appearance.  HENT:     Head: Normocephalic and atraumatic.     Nose: Nose normal.     Mouth/Throat:     Mouth: Mucous membranes are moist.     Comments: Small area of prior bleeding on L tongue as likely source, no abnormality on posterior pharynx Eyes:     Extraocular Movements: Extraocular movements intact.     Conjunctiva/sclera: Conjunctivae normal.  Cardiovascular:     Rate and Rhythm: Normal rate.  Pulmonary:     Effort: Pulmonary effort is normal.     Breath sounds: Normal breath sounds.  Abdominal:     General: Abdomen is flat.     Palpations: Abdomen is soft.     Tenderness: There is no abdominal tenderness.  Musculoskeletal:        General: No swelling. Normal range of motion.     Cervical back: Neck supple.  Skin:    General: Skin is warm and  dry.  Neurological:     General: No focal deficit present.     Mental Status: She is alert.  Psychiatric:        Mood and Affect: Mood normal.     ED Results / Procedures / Treatments   EKG None  Procedures Procedures  Medications Ordered in the ED Medications  labetalol (NORMODYNE) injection 20 mg (20 mg Intravenous Given 04/28/24 0114)    Initial Impression and Plan  Patient here for oral bleeding, source appears to be a blood vessel on L tongue, possibly a hemangioma vs local trauma. She is not currently bleeding but BP remains significantly elevated. Labs done in triage show normal CBC, CMP with mild hyponatremia, otherwise no signs of end organ damage. Will give a dose of labetalol and reassess BP for discharge with outpatient ENT follow up.   ED Course   Clinical Course as of 04/28/24 0200  Tue Apr 28, 2024  0151 BP improved, no further bleeding, recommend she take her usually scheduled meds in the AM. Follow up with ENT as above.  [CS]    Clinical Course User Index [CS] Roselyn Carlin NOVAK, MD     MDM Rules/Calculators/A&P Medical Decision Making Problems Addressed: Primary hypertension: chronic illness or injury Tongue wound, initial encounter: acute illness or injury  Amount and/or Complexity of Data Reviewed Labs: ordered. Decision-making details documented in ED Course.  Risk Prescription drug management.     Final Clinical Impression(s) / ED Diagnoses Final diagnoses:  Tongue wound, initial encounter  Primary hypertension    Rx / DC Orders ED Discharge Orders     None        Roselyn Carlin NOVAK, MD 04/28/24 0200

## 2024-05-29 NOTE — Progress Notes (Unsigned)
 Date:  06/02/2024   ID:  [name] Jones, [name] Jones 05/16/1924, MRN [id number]  PCP:  Henry Ingle, MD  Cardiologist:  Nickolus Wadding, MD  Electrophysiologist:  None   Evaluation Performed:  Follow-Up Visit  Chief Complaint:  HTN,Afib   History of Present Illness:    [name] Jones is a 89 y.o. female seen for follow up HTN and AFib. She has a history of HTN. In early November 2016 she was noted to have new onset atrial fibrillation. She was started on Xarelto . She deteriorated and was admitted with acute diastolic CHF. She was diuresed with IV lasix . She had hyponatremia. She had an Echo in May 2016 while in [facility] showing Normal LV function with grade 2 diastolic dysfunction. There was moderate MR and mild to moderate TR. Mild pulmonary HTN. Repeat Echo in hospital showed normal LV function with severe MR and TR and mod to severe biatrial enlargement. She later underwent DCCV on 09/15/15. On follow up she was noted to be bradycardic with HR into low 40s. Her metoprolol  and cardizem  doses were reduced. Later cardizem  discontinued. On prior visit she was still bradycardic so we stopped her Toprol  XL and reduced amiodarone  to 100 mg daily this was in Nov 2020.   On follow up today she is seen with her son. She is doing well. Was seen in the ED one month ago with some bleeding in the mouth. Had a small ulcer on her tongue. No bleeding since. Breathing is ok. No edema. Bp at home 125-130 systolic     Past Medical History:  Diagnosis Date   Atrial fibrillation (HCC)    Chronic diastolic CHF (congestive heart failure) (HCC)    Glaucoma    Hypertension    Hyponatremia    Iron deficiency anemia    Mitral insufficiency    TIA (transient ischemic attack) 1997   Tricuspid insufficiency    Past Surgical History:  Procedure Laterality Date   ABDOMINAL HYSTERECTOMY     CARDIOVERSION N/A 09/15/2015   Procedure: CARDIOVERSION;  Surgeon: Maude JAYSON Emmer, MD;  Location[facility] ENDOSCOPY;  Service:  Cardiovascular;  Laterality: N/A;   CATARACT EXTRACTION     DIAGNOSTIC LAPAROSCOPY     STRABISMUS SURGERY     TONSILLECTOMY       No outpatient medications have been marked as taking for the 06/02/24 encounter (Office Visit) with Hrishikesh Hoeg M, MD.     Allergies:   Lisinopril    Social History   Tobacco Use   Smoking status: Never   Smokeless tobacco: Never  Vaping Use   Vaping status: Never Used  Substance Use Topics   Alcohol use: Yes    Alcohol/week: 0.0 standard drinks of alcohol    Comment: occasional   Drug use: No     Family Hx: The patient's family history includes Heart attack in her brother; Heart attack (age of onset: 63) in her son; Heart attack (age of onset: 35) in her father; Heart disease in her mother; Hypertension in her son; Ovarian cancer (age of onset: 25) in her mother.  ROS:   Please see the history of present illness.    All other systems reviewed and are negative.   Prior CV studies:   The following studies were reviewed today:  none  Labs/Other Tests and Data Reviewed:         Recent Labs: 04/27/2024: ALT 26; BUN 18; Creatinine, Ser 0.89; Hemoglobin 13.1; Platelets 205; Potassium 4.3; Sodium 130   Recent  Lipid Panel Lab Results  Component Value Date/Time   CHOL 192 09/29/2018 10:40 AM   TRIG 121 09/29/2018 10:40 AM   HDL 73 09/29/2018 10:40 AM   CHOLHDL 2.6 09/29/2018 10:40 AM   CHOLHDL 3.0 06/08/2015 11:22 PM   LDLCALC 95 09/29/2018 10:40 AM   Labs reviewed from Dr Henry in November 2021. Normal CMET and TSH. Hgb 11.1. LDL 106. Cholesterol 203. HDL 70. Reviewed labs from November 2022. CMET and CBC normal.  Wt Readings from Last 3 Encounters:  06/02/24 116 lb 9.6 oz (52.9 kg)  04/27/24 115 lb 15.4 oz (52.6 kg)  12/04/23 116 lb (52.6 kg)     Objective:    Vital Signs:  BP (!) 154/78 (BP Location: Right Arm, Patient Position: Sitting, Cuff Size: Normal)   Pulse 83   Resp 16   Ht 5' (1.524 m)   Wt 116 lb 9.6 oz (52.9  kg)   SpO2 95%   BMI 22.77 kg/m    GENERAL:  Well appearing, elderly WF in NAD HEENT:  PERRL, EOMI, sclera are clear. Oropharynx is clear. NECK:  No jugular venous distention, carotid upstroke brisk and symmetric, no bruits, no thyromegaly or adenopathy LUNGS:  Clear to auscultation bilaterally CHEST:  Unremarkable HEART:  IRRR,  PMI not displaced or sustained,S1 and S2 within normal limits, no S3, no S4: no clicks, no rubs, gr 2/6 systolic murmur at apex. ABD:  Soft, nontender. BS +, no masses or bruits. No hepatomegaly, no splenomegaly EXT:  2 + pulses throughout, no edema, no cyanosis no clubbing SKIN:  Warm and dry.  No rashes NEURO:  Alert and oriented x 3. Cranial nerves II through XII intact. PSYCH:  Cognitively intact    ASSESSMENT & PLAN:    1.  Atrial fibrillation s/p DCCV in February 2017. Had recurrence. No on  rate control strategy with beta blocker.  Continue  Xarelto  (lower dose due to age and weight).     2. Chronic diastolic CHF.  Volume status appears to be normal on current lasix  dose.   3. Hyponatremia. last sodium 130. Restrict water intake.    4. Moderate to severe MR and TR.   Will treat medically. She is not a candidate for valve surgery.    5. Iron deficiency anemia. Last Hgb normal.    6. HTN- labile. BP ok based on home readings.      Follow up 6 months  Medication Adjustments/Labs and Tests Ordered: Current medicines are reviewed at length with the patient today.  Concerns regarding medicines are outlined above.   Tests Ordered: No orders of the defined types were placed in this encounter.    Medication Changes: No orders of the defined types were placed in this encounter.   Signed, Tonji Elliff, MD  06/02/2024 4:33 PM    [facility]

## 2024-06-02 ENCOUNTER — Ambulatory Visit: Attending: Cardiology | Admitting: Cardiology

## 2024-06-02 ENCOUNTER — Encounter: Payer: Self-pay | Admitting: Cardiology

## 2024-06-02 VITALS — BP 154/78 | HR 83 | Resp 16 | Ht 60.0 in | Wt 116.6 lb

## 2024-06-02 DIAGNOSIS — I4819 Other persistent atrial fibrillation: Secondary | ICD-10-CM | POA: Insufficient documentation

## 2024-06-02 DIAGNOSIS — I10 Essential (primary) hypertension: Secondary | ICD-10-CM | POA: Diagnosis not present

## 2024-06-02 DIAGNOSIS — Z7901 Long term (current) use of anticoagulants: Secondary | ICD-10-CM | POA: Diagnosis not present

## 2024-06-02 DIAGNOSIS — I5032 Chronic diastolic (congestive) heart failure: Secondary | ICD-10-CM | POA: Diagnosis not present

## 2024-06-02 NOTE — Patient Instructions (Signed)
 Medication Instructions:  Continue same medications *If you need a refill on your cardiac medications before your next appointment, please call your pharmacy*  Lab Work: None ordered  Testing/Procedures: None ordered  Follow-Up: At [facility], you and your health needs are our priority.  As part of our continuing mission to provide you with exceptional heart care, our providers are all part of one team.  This team includes your primary Cardiologist (physician) and Advanced Practice Providers or APPs (Physician Assistants and Nurse Practitioners) who all work together to provide you with the care you need, when you need it.  Your next appointment:  6 months    Call in Feb to schedule May appointment     Provider:  Dr.Jordan   We recommend signing up for the patient portal called MyChart.  Sign up information is provided on this After Visit Summary.  MyChart is used to connect with patients for Virtual Visits (Telemedicine).  Patients are able to view lab/test results, encounter notes, upcoming appointments, etc.  Non-urgent messages can be sent to your provider as well.   To learn more about what you can do with MyChart, go to [email or web address].

## 2024-07-24 ENCOUNTER — Telehealth: Payer: Self-pay | Admitting: Cardiology

## 2024-07-24 MED ORDER — FUROSEMIDE 20 MG PO TABS: ORAL_TABLET | ORAL | 3 refills | Status: AC

## 2024-07-24 NOTE — Telephone Encounter (Signed)
 Pt's medication was sent to pt's pharmacy as requested. Confirmation received.

## 2024-07-24 NOTE — Telephone Encounter (Signed)
" °*  STAT* If patient is at the pharmacy, call can be transferred to refill team.   1. Which medications need to be refilled? (please list name of each medication and dose if known) furosemide  (LASIX ) 20 MG tablet    2. Would you like to learn more about the convenience, safety, & potential cost savings by using the [facility]? No   3. Are you open to using the [facility] (Type [facility]. ) No   4. Which pharmacy/location (including street and city if local pharmacy) is medication to be sent to?  [id number] - [place], [place] - [address] AT [facility] [address] Phone: [phone]  Fax: [phone]      5. Do they need a 30 day or 90 day supply? 90 day  Pt has been out of meds for 3 days  "

## 2024-08-03 ENCOUNTER — Other Ambulatory Visit: Payer: Self-pay | Admitting: Cardiology

## 2024-08-04 NOTE — Telephone Encounter (Signed)
 Pt last saw Dr Jordan 06/02/24, last labs 04/27/24 Creat 0.89, age 89, weight 52.9kg, CrCl 29.47, based on CrCl pt is on appropriate dosage of Xarelto  15mg  every day for afib.  Will refill rx.
